# Patient Record
Sex: Female | Born: 1955
Health system: Southern US, Community
[De-identification: ages and names within clinical notes are randomized; demographics above are authoritative.]

## PROBLEM LIST (undated history)

## (undated) DIAGNOSIS — I10 Essential (primary) hypertension: Secondary | ICD-10-CM

## (undated) DIAGNOSIS — J302 Other seasonal allergic rhinitis: Secondary | ICD-10-CM

## (undated) DIAGNOSIS — M5030 Other cervical disc degeneration, unspecified cervical region: Secondary | ICD-10-CM

## (undated) DIAGNOSIS — Z973 Presence of spectacles and contact lenses: Secondary | ICD-10-CM

## (undated) DIAGNOSIS — H409 Unspecified glaucoma: Secondary | ICD-10-CM

## (undated) DIAGNOSIS — M1990 Unspecified osteoarthritis, unspecified site: Secondary | ICD-10-CM

## (undated) DIAGNOSIS — Z8489 Family history of other specified conditions: Secondary | ICD-10-CM

## (undated) DIAGNOSIS — T7840XA Allergy, unspecified, initial encounter: Secondary | ICD-10-CM

## (undated) DIAGNOSIS — E785 Hyperlipidemia, unspecified: Secondary | ICD-10-CM

## (undated) DIAGNOSIS — H269 Unspecified cataract: Secondary | ICD-10-CM

## (undated) HISTORY — DX: Hyperlipidemia, unspecified: E78.5

## (undated) HISTORY — DX: Unspecified osteoarthritis, unspecified site: M19.90

## (undated) HISTORY — DX: Unspecified cataract: H26.9

## (undated) HISTORY — PX: KNEE ARTHROSCOPY: SHX127

## (undated) HISTORY — DX: Allergy, unspecified, initial encounter: T78.40XA

## (undated) HISTORY — PX: POLYPECTOMY: SHX149

## (undated) HISTORY — DX: Essential (primary) hypertension: I10

## (undated) HISTORY — PX: COLONOSCOPY: SHX174

---

## 1998-10-19 HISTORY — PX: CERVICAL FUSION: SHX112

## 1999-07-29 ENCOUNTER — Encounter: Payer: Self-pay | Admitting: Neurosurgery

## 1999-07-29 ENCOUNTER — Inpatient Hospital Stay (HOSPITAL_COMMUNITY): Admission: RE | Admit: 1999-07-29 | Discharge: 1999-07-30 | Payer: Self-pay | Admitting: Neurosurgery

## 1999-09-10 ENCOUNTER — Encounter: Payer: Self-pay | Admitting: Neurosurgery

## 1999-09-10 ENCOUNTER — Encounter: Admission: RE | Admit: 1999-09-10 | Discharge: 1999-09-10 | Payer: Self-pay | Admitting: Neurosurgery

## 2000-08-12 ENCOUNTER — Encounter: Payer: Self-pay | Admitting: Obstetrics and Gynecology

## 2000-08-12 ENCOUNTER — Encounter: Admission: RE | Admit: 2000-08-12 | Discharge: 2000-08-12 | Payer: Self-pay | Admitting: Obstetrics and Gynecology

## 2002-06-01 ENCOUNTER — Encounter: Admission: RE | Admit: 2002-06-01 | Discharge: 2002-06-01 | Payer: Self-pay | Admitting: Obstetrics and Gynecology

## 2002-06-01 ENCOUNTER — Encounter: Payer: Self-pay | Admitting: Obstetrics and Gynecology

## 2004-10-19 DIAGNOSIS — N6009 Solitary cyst of unspecified breast: Secondary | ICD-10-CM

## 2004-10-19 HISTORY — DX: Solitary cyst of unspecified breast: N60.09

## 2005-03-13 ENCOUNTER — Encounter: Admission: RE | Admit: 2005-03-13 | Discharge: 2005-03-13 | Payer: Self-pay | Admitting: Obstetrics and Gynecology

## 2005-03-20 ENCOUNTER — Encounter: Admission: RE | Admit: 2005-03-20 | Discharge: 2005-03-20 | Payer: Self-pay | Admitting: Obstetrics and Gynecology

## 2006-05-25 ENCOUNTER — Encounter: Admission: RE | Admit: 2006-05-25 | Discharge: 2006-05-25 | Payer: Self-pay | Admitting: Obstetrics and Gynecology

## 2007-09-09 ENCOUNTER — Encounter: Admission: RE | Admit: 2007-09-09 | Discharge: 2007-09-09 | Payer: Self-pay | Admitting: Obstetrics and Gynecology

## 2007-09-16 ENCOUNTER — Encounter: Admission: RE | Admit: 2007-09-16 | Discharge: 2007-09-16 | Payer: Self-pay | Admitting: Obstetrics and Gynecology

## 2008-02-29 ENCOUNTER — Encounter: Admission: RE | Admit: 2008-02-29 | Discharge: 2008-02-29 | Payer: Self-pay | Admitting: Obstetrics and Gynecology

## 2008-09-12 ENCOUNTER — Encounter: Admission: RE | Admit: 2008-09-12 | Discharge: 2008-09-12 | Payer: Self-pay | Admitting: Obstetrics and Gynecology

## 2009-01-10 ENCOUNTER — Encounter (HOSPITAL_COMMUNITY): Admission: RE | Admit: 2009-01-10 | Discharge: 2009-01-24 | Payer: Self-pay | Admitting: Orthopedic Surgery

## 2009-05-06 ENCOUNTER — Encounter: Admission: RE | Admit: 2009-05-06 | Discharge: 2009-05-06 | Payer: Self-pay | Admitting: Obstetrics and Gynecology

## 2009-11-22 ENCOUNTER — Encounter: Admission: RE | Admit: 2009-11-22 | Discharge: 2009-11-22 | Payer: Self-pay | Admitting: Obstetrics and Gynecology

## 2010-11-09 ENCOUNTER — Encounter: Payer: Self-pay | Admitting: Obstetrics and Gynecology

## 2011-02-02 ENCOUNTER — Other Ambulatory Visit: Payer: Self-pay | Admitting: Obstetrics and Gynecology

## 2011-02-02 DIAGNOSIS — Z1231 Encounter for screening mammogram for malignant neoplasm of breast: Secondary | ICD-10-CM

## 2011-02-05 ENCOUNTER — Ambulatory Visit
Admission: RE | Admit: 2011-02-05 | Discharge: 2011-02-05 | Disposition: A | Discharge status: Home / Self Care | Payer: 59 | Source: Ambulatory Visit | Attending: Obstetrics and Gynecology | Admitting: Obstetrics and Gynecology

## 2011-02-05 DIAGNOSIS — Z1231 Encounter for screening mammogram for malignant neoplasm of breast: Secondary | ICD-10-CM

## 2011-03-12 ENCOUNTER — Ambulatory Visit (AMBULATORY_SURGERY_CENTER): Payer: 59 | Admitting: *Deleted

## 2011-03-12 VITALS — Ht 68.0 in | Wt 204.0 lb

## 2011-03-12 DIAGNOSIS — Z1211 Encounter for screening for malignant neoplasm of colon: Secondary | ICD-10-CM

## 2011-03-12 MED ORDER — PEG-KCL-NACL-NASULF-NA ASC-C 100 G PO SOLR: ORAL | Status: DC

## 2011-03-26 ENCOUNTER — Encounter: Payer: Self-pay | Admitting: Internal Medicine

## 2011-03-26 ENCOUNTER — Ambulatory Visit (AMBULATORY_SURGERY_CENTER): Payer: 59 | Admitting: Internal Medicine

## 2011-03-26 VITALS — BP 139/93 | HR 67 | Temp 97.8°F | Resp 20 | Ht 68.0 in | Wt 204.0 lb

## 2011-03-26 DIAGNOSIS — K5730 Diverticulosis of large intestine without perforation or abscess without bleeding: Secondary | ICD-10-CM

## 2011-03-26 DIAGNOSIS — Z1211 Encounter for screening for malignant neoplasm of colon: Secondary | ICD-10-CM

## 2011-03-26 DIAGNOSIS — D1330 Benign neoplasm of unspecified part of small intestine: Secondary | ICD-10-CM

## 2011-03-26 DIAGNOSIS — D126 Benign neoplasm of colon, unspecified: Secondary | ICD-10-CM

## 2011-03-26 MED ORDER — SODIUM CHLORIDE 0.9 % IV SOLN: 500.0000 mL | INTRAVENOUS | Status: DC

## 2011-03-26 NOTE — Patient Instructions (Signed)
Discharge instructions given with verbal understanding.  Handouts on polyps and diverticulosis given.  Resume previous medications. 

## 2011-03-27 ENCOUNTER — Telehealth: Payer: Self-pay | Admitting: *Deleted

## 2011-03-27 NOTE — Telephone Encounter (Signed)
No ID on answering machine, no message left 

## 2012-02-12 ENCOUNTER — Other Ambulatory Visit: Payer: Self-pay | Admitting: Obstetrics

## 2012-02-12 DIAGNOSIS — Z1231 Encounter for screening mammogram for malignant neoplasm of breast: Secondary | ICD-10-CM

## 2012-03-08 ENCOUNTER — Ambulatory Visit
Admission: RE | Admit: 2012-03-08 | Discharge: 2012-03-08 | Disposition: A | Discharge status: Home / Self Care | Payer: 59 | Source: Ambulatory Visit | Attending: Obstetrics | Admitting: Obstetrics

## 2012-03-08 DIAGNOSIS — Z1231 Encounter for screening mammogram for malignant neoplasm of breast: Secondary | ICD-10-CM

## 2012-03-11 ENCOUNTER — Other Ambulatory Visit: Payer: Self-pay | Admitting: Obstetrics

## 2012-03-11 DIAGNOSIS — R928 Other abnormal and inconclusive findings on diagnostic imaging of breast: Secondary | ICD-10-CM

## 2012-03-22 ENCOUNTER — Ambulatory Visit
Admission: RE | Admit: 2012-03-22 | Discharge: 2012-03-22 | Disposition: A | Discharge status: Home / Self Care | Payer: 59 | Source: Ambulatory Visit | Attending: Obstetrics | Admitting: Obstetrics

## 2012-03-22 DIAGNOSIS — R928 Other abnormal and inconclusive findings on diagnostic imaging of breast: Secondary | ICD-10-CM

## 2012-07-30 ENCOUNTER — Encounter (HOSPITAL_COMMUNITY): Payer: Self-pay | Admitting: *Deleted

## 2012-07-30 ENCOUNTER — Emergency Department (HOSPITAL_COMMUNITY)
Admission: EM | Admit: 2012-07-30 | Discharge: 2012-07-30 | Disposition: A | Discharge status: Home / Self Care | Payer: 59 | Source: Home / Self Care

## 2012-07-30 DIAGNOSIS — J029 Acute pharyngitis, unspecified: Secondary | ICD-10-CM

## 2012-07-30 MED ORDER — AZITHROMYCIN 250 MG PO TABS: ORAL_TABLET | ORAL | Status: DC

## 2012-07-30 NOTE — ED Notes (Signed)
On Tues started with extreme fatigue, sore throat, and fevers.  Felt better Thurs & Fri, then woke up this morning "starting all over again" with sore throat, HA, gland soreness.  Nausea at times, no vomiting.  Denies cough or any congestion.  Throat red.  Has been taking Aleve (last dose @ 0800) and Nyquil.

## 2012-07-30 NOTE — ED Provider Notes (Signed)
History     CSN: [id number]  Arrival date & time 07/30/12  1102   None     Chief Complaint  Patient presents with  . Sore Throat  . Headache  . Fever    (Consider location/radiation/quality/duration/timing/severity/associated sxs/prior treatment) Patient is a 56 y.o. female presenting with pharyngitis. The history is provided by the patient. No language interpreter was used.  Sore Throat This is a new problem. Episode onset: 4 days. The problem occurs constantly. The problem has been gradually worsening. Nothing relieves the symptoms. She has tried nothing for the symptoms.  Pt complains of cough, fever and sore throat.    Past Medical History  Diagnosis Date  . Allergy     seasonal  . Arthritis     Past Surgical History  Procedure Date  . Knee arthroscopy     bilateral  . Cervical fusion     No family history on file.  History  Substance Use Topics  . Smoking status: Never Smoker   . Smokeless tobacco: Never Used  . Alcohol Use: Yes     occasional    OB History    Grav Para Term Preterm Abortions TAB SAB Ect Mult Living                  Review of Systems  HENT: Positive for sore throat and sinus pressure.   Respiratory: Positive for cough.   All other systems reviewed and are negative.    Allergies  Penicillins and Sulfa antibiotics  Home Medications   Current Outpatient Rx  Name Route Sig Dispense Refill  . ESTROGENS CONJ SYNTHETIC A PO Oral Take by mouth.    . MULTI-VITAMIN/MINERALS PO TABS Oral Take 1 tablet by mouth daily.      Marland Kitchen CALCIUM CARBONATE 1250 MG PO CAPS Oral Take 1,250 mg by mouth 2 (two) times daily with a meal.      . VITAMIN D 1000 UNITS PO TABS Oral Take 1,000 Units by mouth daily.      Marland Kitchen JINTELI 1-5 MG-MCG PO TABS Oral Take 1 tablet by mouth Daily.    Marland Kitchen LORATADINE 10 MG PO TABS Oral Take 10 mg by mouth daily.      Marland Kitchen NAPROXEN SODIUM 220 MG PO TABS Oral Take 440 mg by mouth at bedtime.        BP 165/85  Pulse 66  Temp  98.3 F (36.8 C) (Oral)  Resp 16  SpO2 100%  Physical Exam  Nursing note and vitals reviewed. Constitutional: She appears well-developed and well-nourished.  HENT:  Head: Normocephalic and atraumatic.  Right Ear: External ear normal.  Left Ear: External ear normal.  Nose: Nose normal.  Mouth/Throat: Oropharynx is clear and moist.  Eyes: Conjunctivae normal and EOM are normal. Pupils are equal, round, and reactive to light.  Neck: Normal range of motion. Neck supple.  Cardiovascular: Normal rate and normal heart sounds.   Pulmonary/Chest: Effort normal and breath sounds normal.  Abdominal: Soft.  Musculoskeletal: Normal range of motion.  Neurological: She is alert.  Skin: Skin is warm.    ED Course  Procedures (including critical care time)   Labs Reviewed  POCT RAPID STREP A ([facility] CARE ONLY)   No results found.   1. Pharyngitis       MDM  Rx for zithromax.   I advisd recheck with primary in 2-3 days.       Lonia Skinner [place], [place] 07/30/12 [phone]

## 2012-08-01 NOTE — ED Provider Notes (Signed)
Medical screening examination/treatment/procedure(s) were performed by non-physician practitioner and as supervising physician I was immediately available for consultation/collaboration.  Leslee Home, M.D.   Reuben Likes, MD 08/01/12 [phone]

## 2012-10-19 HISTORY — PX: CATARACT EXTRACTION W/ INTRAOCULAR LENS IMPLANT: SHX1309

## 2012-10-19 HISTORY — PX: CARPAL TUNNEL RELEASE: SHX101

## 2013-03-15 ENCOUNTER — Other Ambulatory Visit: Payer: Self-pay

## 2013-03-15 DIAGNOSIS — Z1231 Encounter for screening mammogram for malignant neoplasm of breast: Secondary | ICD-10-CM

## 2013-04-19 ENCOUNTER — Ambulatory Visit: Payer: 59

## 2013-05-01 ENCOUNTER — Ambulatory Visit
Admission: RE | Admit: 2013-05-01 | Discharge: 2013-05-01 | Disposition: A | Discharge status: Home / Self Care | Payer: 59 | Source: Ambulatory Visit

## 2013-05-01 ENCOUNTER — Other Ambulatory Visit: Payer: Self-pay | Admitting: Physician Assistant

## 2013-05-01 ENCOUNTER — Other Ambulatory Visit: Payer: Self-pay

## 2013-05-01 ENCOUNTER — Encounter: Payer: Self-pay | Admitting: Family Medicine

## 2013-05-01 DIAGNOSIS — Z1231 Encounter for screening mammogram for malignant neoplasm of breast: Secondary | ICD-10-CM

## 2013-05-01 NOTE — Telephone Encounter (Signed)
Medication refill for one time only.  Patient needs to be seen.  Letter sent for patient to call and schedule 

## 2013-05-30 ENCOUNTER — Ambulatory Visit (INDEPENDENT_AMBULATORY_CARE_PROVIDER_SITE_OTHER): Payer: 59 | Admitting: Family Medicine

## 2013-05-30 ENCOUNTER — Encounter: Payer: Self-pay | Admitting: Family Medicine

## 2013-05-30 VITALS — BP 110/78 | HR 74 | Temp 98.4°F | Resp 16 | Ht 68.0 in | Wt 211.0 lb

## 2013-05-30 DIAGNOSIS — I10 Essential (primary) hypertension: Secondary | ICD-10-CM | POA: Insufficient documentation

## 2013-05-30 LAB — CBC WITH DIFFERENTIAL/PLATELET
Eosinophils Relative: 5 % (ref 0–5)
HCT: 40.1 % (ref 36.0–46.0)
Hemoglobin: 13.8 g/dL (ref 12.0–15.0)
Lymphocytes Relative: 38 % (ref 12–46)
Lymphs Abs: 2 10*3/uL (ref 0.7–4.0)
MCV: 89.1 fL (ref 78.0–100.0)
Monocytes Absolute: 0.4 10*3/uL (ref 0.1–1.0)
Monocytes Relative: 7 % (ref 3–12)
RBC: 4.5 MIL/uL (ref 3.87–5.11)
RDW: 13.3 % (ref 11.5–15.5)
WBC: 5.2 10*3/uL (ref 4.0–10.5)

## 2013-05-30 LAB — COMPLETE METABOLIC PANEL WITH GFR
CO2: 29 mEq/L (ref 19–32)
GFR, Est African American: 71 mL/min
GFR, Est Non African American: 61 mL/min
Glucose, Bld: 103 mg/dL — ABNORMAL HIGH (ref 70–99)
Sodium: 141 mEq/L (ref 135–145)
Total Bilirubin: 0.9 mg/dL (ref 0.3–1.2)
Total Protein: 7.2 g/dL (ref 6.0–8.3)

## 2013-05-30 LAB — LIPID PANEL: HDL: 72 mg/dL (ref >39)

## 2013-05-30 NOTE — Progress Notes (Signed)
  Subjective:    Patient ID: Nancy Steele, female    DOB: [birth year], 57 y.o.   MRN: [id number]  HPI Patient is here today for followup of her blood pressure. She is currently on lisinopril 5 mg by mouth daily. She denies any chest pain, shortness of breath, dyspnea on exertion. Her blood pressure is low at 110-120/70-80. She also reports a daily cough on the lisinopril. She is requesting having her cholesterol checked for screening purposes. Colonoscopy is up to date. Her tetanus shot is up-to-date. She currently sees a gynecologist for her Pap smear, breast exam and hormone replacement. Past Medical History  Diagnosis Date  . Allergy     seasonal  . Arthritis   . Hypertension    Current outpatient prescriptions:aspirin 81 MG tablet, Take 81 mg by mouth daily., Disp: , Rfl: ;  Calcium Carb-Cholecalciferol (CALTRATE 600+D) 600-800 MG-UNIT TABS, Take by mouth., Disp: , Rfl: ;  norethindrone-ethinyl estradiol (FEMHRT LOW DOSE) 0.5-2.5 MG-MCG per tablet, Take 1 tablet by mouth daily., Disp: , Rfl:  Current facility-administered medications:0.9 %  sodium chloride infusion, 500 mL, Intravenous, Continuous, Hilarie Fredrickson, MD  Allergies  Allergen Reactions  . Vancomycin Hives  . Penicillins Swelling and Rash  . Sulfa Antibiotics Nausea And Vomiting   History   Social History  . Marital Status: Married    Spouse Name: N/A    Number of Children: N/A  . Years of Education: N/A   Occupational History  . Not on file.   Social History Main Topics  . Smoking status: Never Smoker   . Smokeless tobacco: Never Used  . Alcohol Use: Yes     Comment: occasional  . Drug Use: No  . Sexually Active: Not on file   Other Topics Concern  . Not on file   Social History Narrative  . No narrative on file   No family history on file.    Review of Systems  All other systems reviewed and are negative.       Objective:   Physical Exam  Vitals reviewed. Constitutional: She appears  well-developed and well-nourished.  Neck: Neck supple. No JVD present. No thyromegaly present.  Cardiovascular: Normal rate, regular rhythm and intact distal pulses.  Exam reveals no gallop and no friction rub.   No murmur heard. Pulmonary/Chest: Effort normal and breath sounds normal. No respiratory distress. She has no wheezes. She has no rales. She exhibits no tenderness.  Abdominal: Soft. Bowel sounds are normal. She exhibits no distension and no mass. There is no tenderness. There is no rebound and no guarding.  Musculoskeletal: She exhibits no edema.  Lymphadenopathy:    She has no cervical adenopathy.          Assessment & Plan:  1. HTN (hypertension) Discontinue lisinopril. Monitor blood pressure closely at home. If greater than 140/90 I would resume medication. Check fasting lipid panel today. Goal LDL is less than 130.  Cancer screening is up to date. Recommended prominent milligrams a day of calcium and 1000 units a day of vitamin D. - COMPLETE METABOLIC PANEL WITH GFR - Lipid panel - CBC with Differential

## 2013-06-01 ENCOUNTER — Encounter: Payer: Self-pay | Admitting: Family Medicine

## 2013-06-25 ENCOUNTER — Other Ambulatory Visit: Payer: Self-pay | Admitting: Physician Assistant

## 2013-06-26 IMAGING — MG MM DIGITAL DIAGNOSTIC LIMITED*R*
2 series · 2 of 2 positions shown · non-contrast
Comparison: 03/08/2012 and earlier

CLINICAL DATA: The patient returns after screening study for
evaluation of the right breast.

DIGITAL DIAGNOSTIC RIGHT MAMMOGRAM  AND RIGHT BREAST ULTRASOUND:

[R CC]
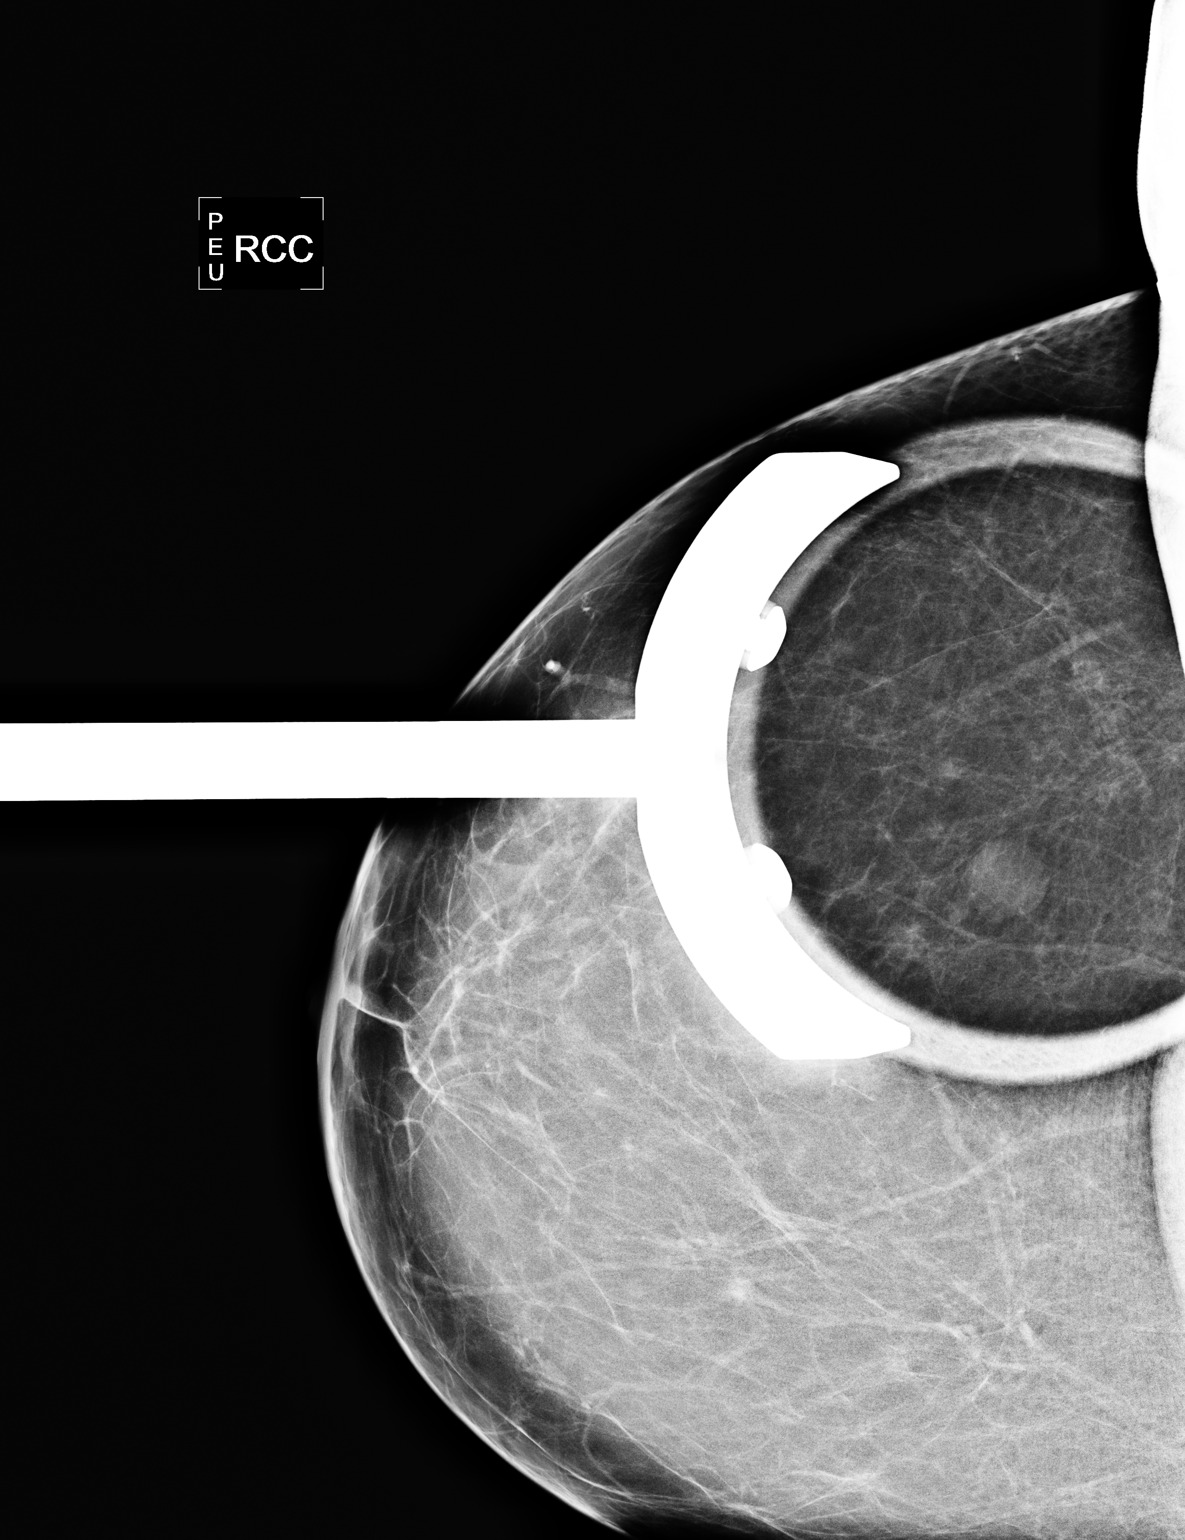

[R MLO]
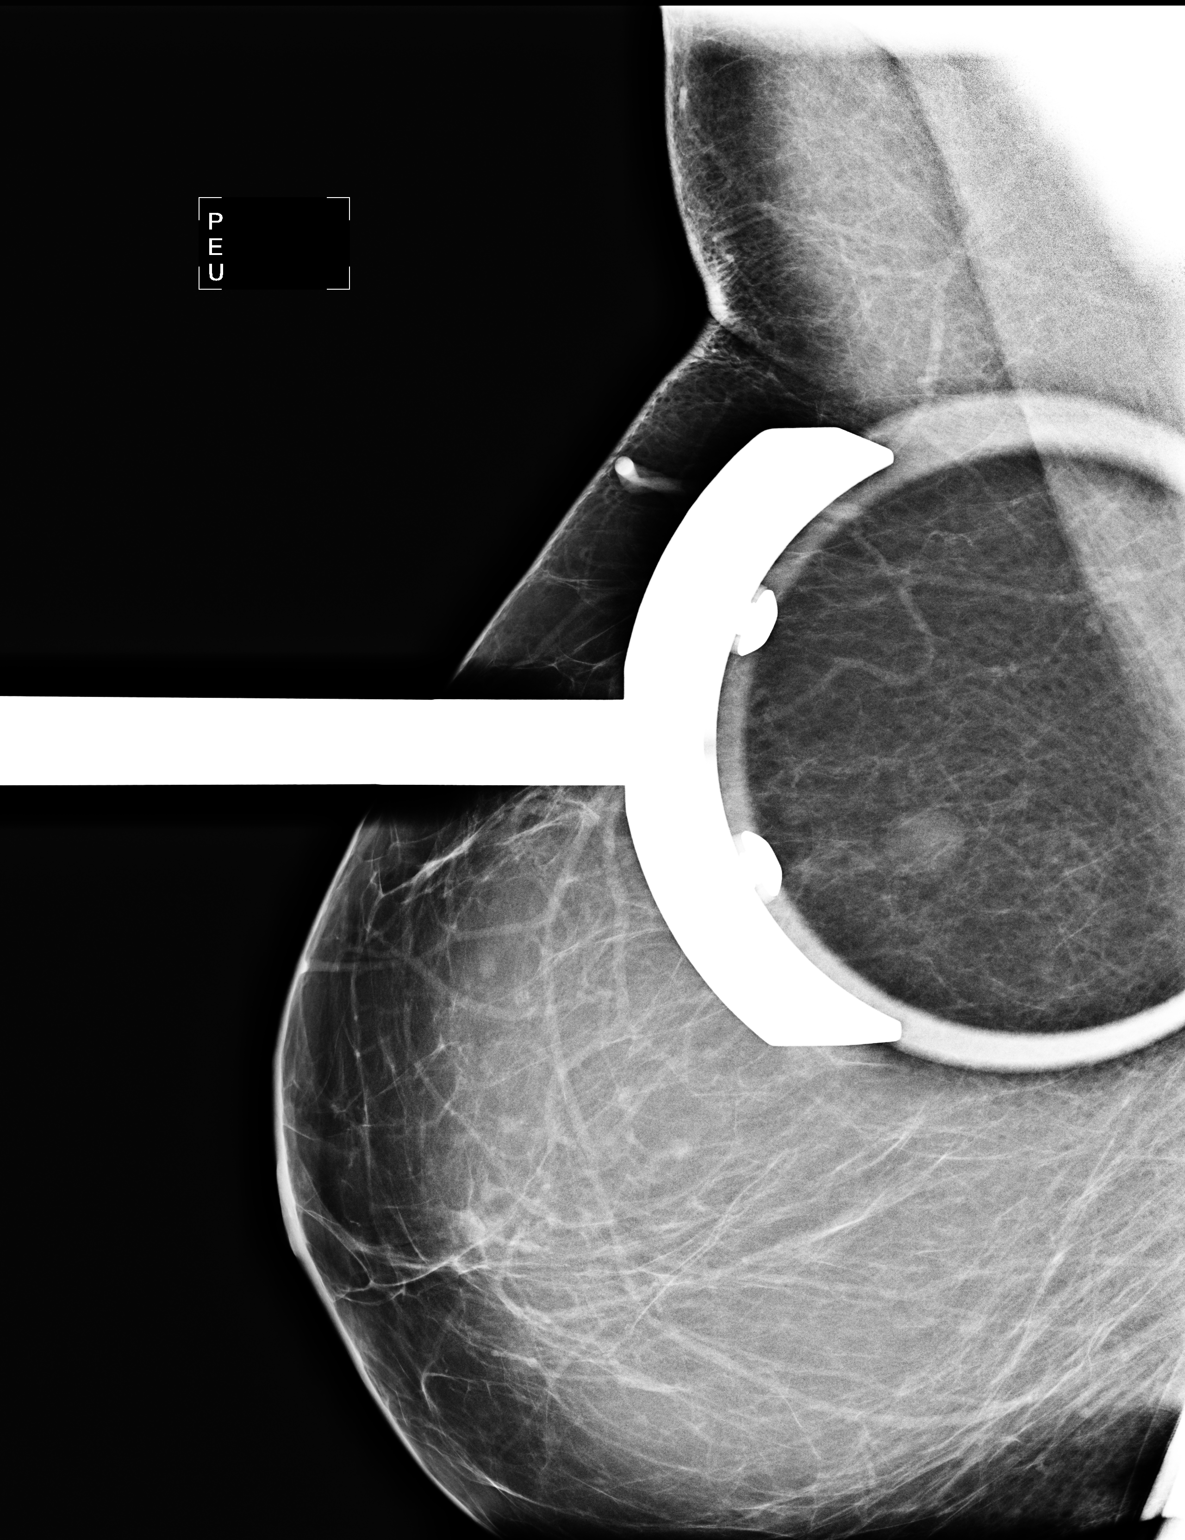

[2 of 2 positions shown; findings below may reference images not displayed]

FINDINGS: Spot compression views are performed, confirming
presence of a low density circumscribed round mass in the upper
outer quadrant of the right breast.

On physical exam, I palpate no abnormality within the upper-outer
quadrant of the right breast.

Ultrasound is performed, showing a simple cyst in the 10 o'clock
position of the right breast 5 cm from the nipple.  This measures 9
x 5 x 11 mm.  No solid mass or area of acoustic shadowing
identified.
IMPRESSION: 1.  Persistent abnormality represents a simple cyst by ultrasound.
2. No mammographic or ultrasound evidence for malignancy.
3. Screening mammogram is recommended in one year.

BI-RADS CATEGORY 2:  Benign finding(s).

## 2013-06-26 IMAGING — US US BREAST*R*
1 series · 4 of 4 positions shown · non-contrast
Comparison: 03/08/2012 and earlier

CLINICAL DATA: The patient returns after screening study for
evaluation of the right breast.

DIGITAL DIAGNOSTIC RIGHT MAMMOGRAM  AND RIGHT BREAST ULTRASOUND:

[Series 1: us breast*right* · 4 of 4 slices shown]
[im 1/4]
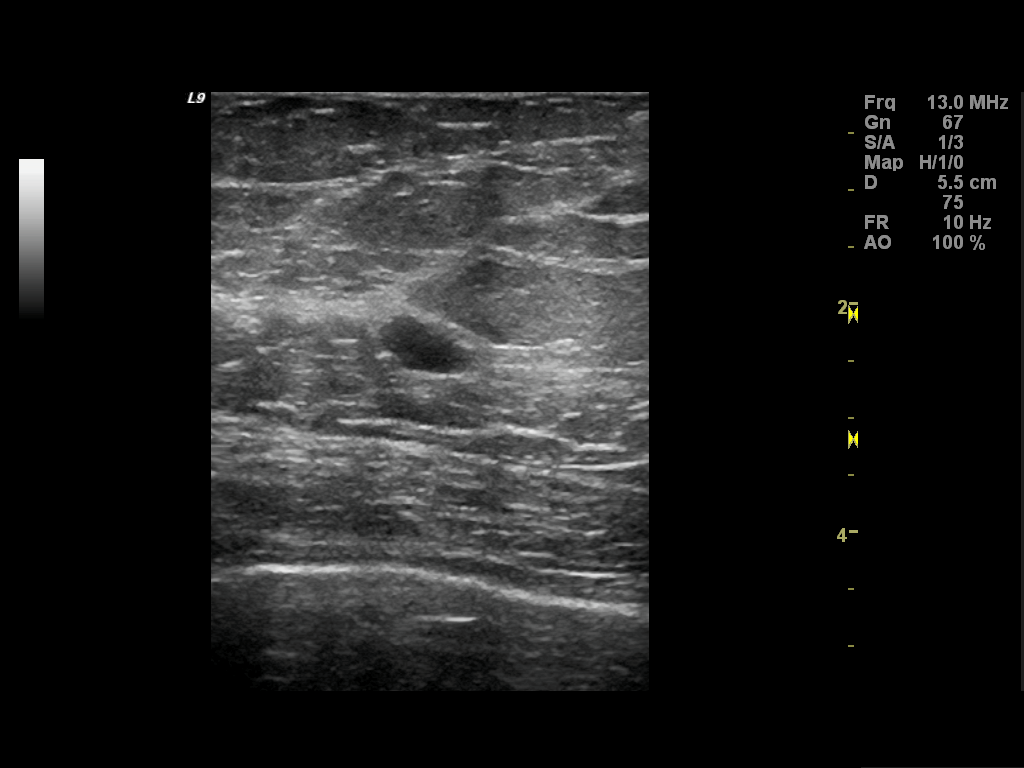
[im 2/4]
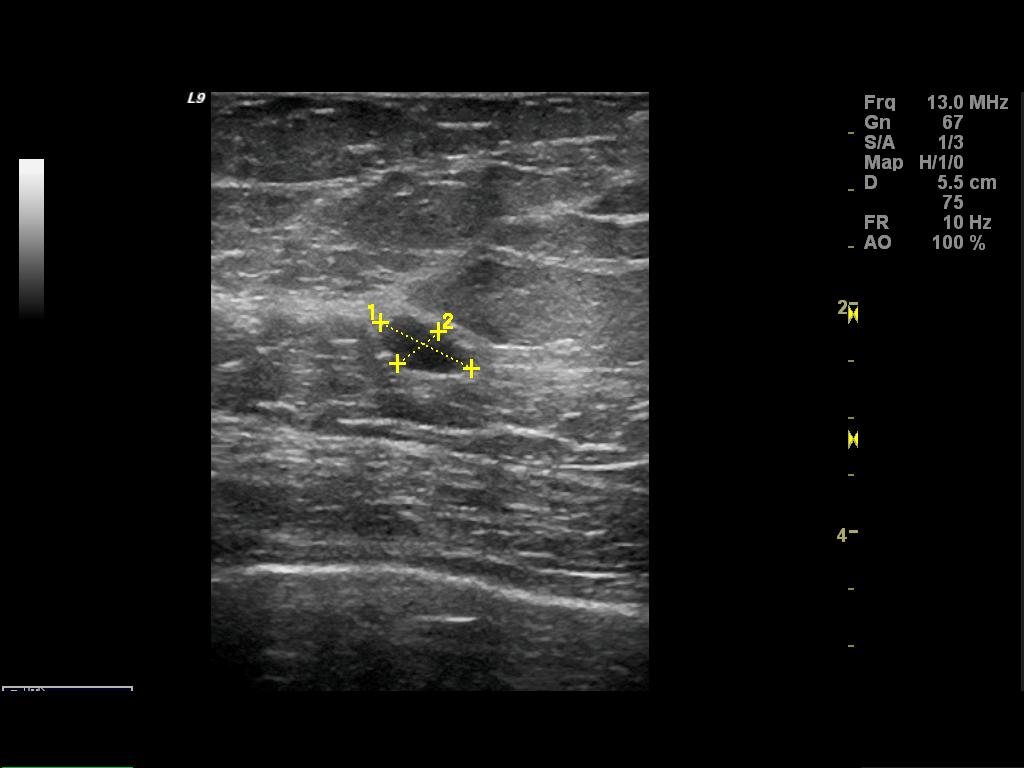
[im 3/4]
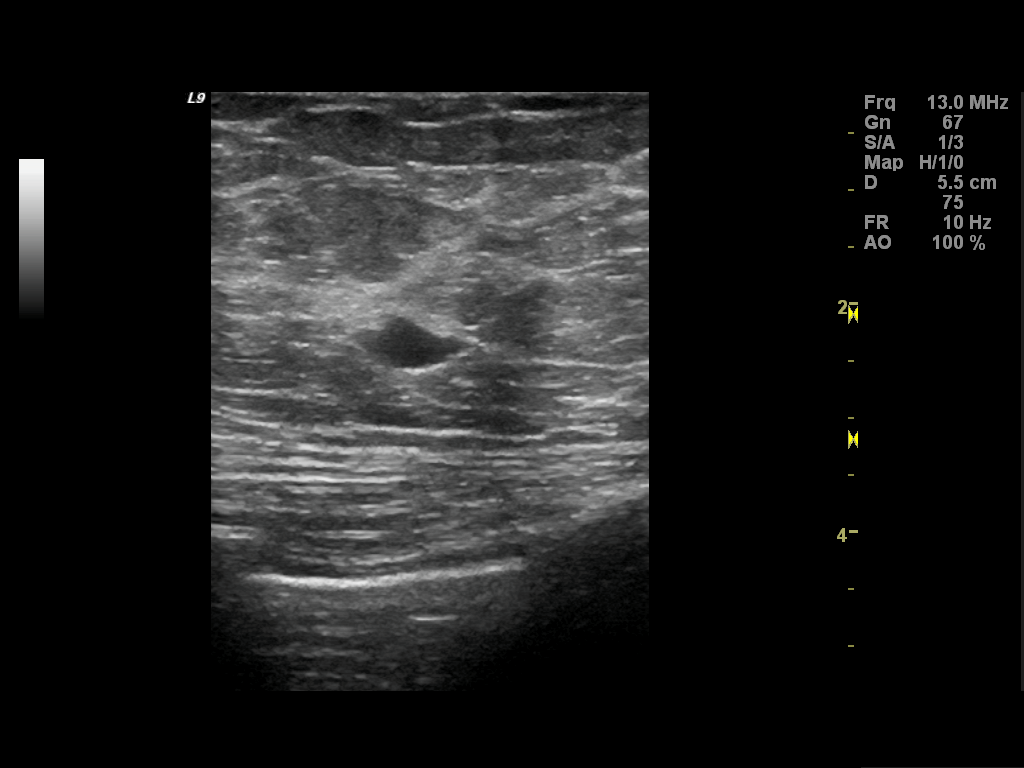
[im 4/4]
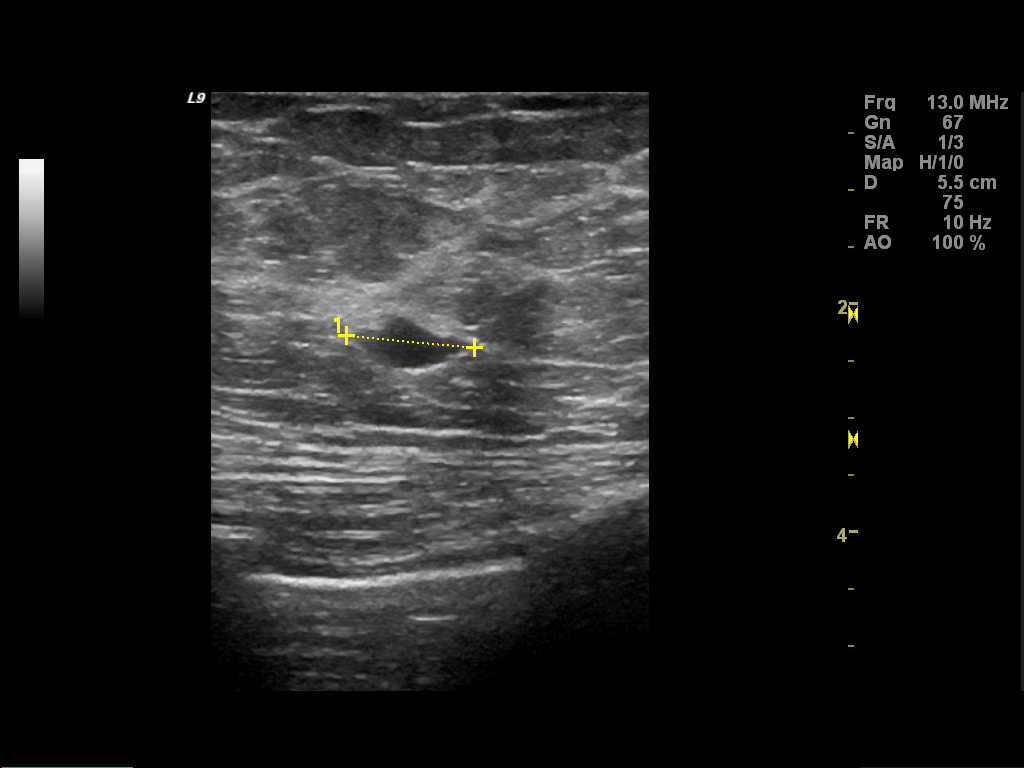

[4 of 4 positions shown; findings below may reference images not displayed]

FINDINGS: Spot compression views are performed, confirming
presence of a low density circumscribed round mass in the upper
outer quadrant of the right breast.

On physical exam, I palpate no abnormality within the upper-outer
quadrant of the right breast.

Ultrasound is performed, showing a simple cyst in the 10 o'clock
position of the right breast 5 cm from the nipple.  This measures 9
x 5 x 11 mm.  No solid mass or area of acoustic shadowing
identified.
IMPRESSION: 1.  Persistent abnormality represents a simple cyst by ultrasound.
2. No mammographic or ultrasound evidence for malignancy.
3. Screening mammogram is recommended in one year.

BI-RADS CATEGORY 2:  Benign finding(s).

## 2013-08-24 ENCOUNTER — Other Ambulatory Visit: Payer: Self-pay

## 2014-03-01 ENCOUNTER — Other Ambulatory Visit: Payer: Self-pay | Admitting: Neurological Surgery

## 2014-03-01 DIAGNOSIS — M47812 Spondylosis without myelopathy or radiculopathy, cervical region: Secondary | ICD-10-CM

## 2014-03-04 ENCOUNTER — Ambulatory Visit
Admission: RE | Admit: 2014-03-04 | Discharge: 2014-03-04 | Disposition: A | Discharge status: Home / Self Care | Payer: Self-pay | Source: Ambulatory Visit | Attending: Neurological Surgery | Admitting: Neurological Surgery

## 2014-03-04 DIAGNOSIS — M47812 Spondylosis without myelopathy or radiculopathy, cervical region: Secondary | ICD-10-CM

## 2014-06-14 ENCOUNTER — Other Ambulatory Visit: Payer: Self-pay

## 2014-06-14 DIAGNOSIS — Z1231 Encounter for screening mammogram for malignant neoplasm of breast: Secondary | ICD-10-CM

## 2014-06-22 ENCOUNTER — Ambulatory Visit (INDEPENDENT_AMBULATORY_CARE_PROVIDER_SITE_OTHER): Payer: 59 | Admitting: Family Medicine

## 2014-06-22 ENCOUNTER — Encounter: Payer: Self-pay | Admitting: Family Medicine

## 2014-06-22 ENCOUNTER — Ambulatory Visit
Admission: RE | Admit: 2014-06-22 | Discharge: 2014-06-22 | Disposition: A | Discharge status: Home / Self Care | Payer: 59 | Source: Ambulatory Visit

## 2014-06-22 VITALS — BP 146/100 | HR 80 | Temp 98.3°F | Resp 16 | Ht 68.0 in | Wt 208.0 lb

## 2014-06-22 DIAGNOSIS — Z1231 Encounter for screening mammogram for malignant neoplasm of breast: Secondary | ICD-10-CM

## 2014-06-22 DIAGNOSIS — I10 Essential (primary) hypertension: Secondary | ICD-10-CM

## 2014-06-22 LAB — BASIC METABOLIC PANEL WITHOUT GFR
BUN: 19 mg/dL (ref 6–23)
CO2: 29 meq/L (ref 19–32)
Calcium: 9.7 mg/dL (ref 8.4–10.5)
Chloride: 105 meq/L (ref 96–112)
Creat: 0.91 mg/dL (ref 0.50–1.10)
GFR, Est African American: 82 mL/min
GFR, Est Non African American: 71 mL/min
Glucose, Bld: 87 mg/dL (ref 70–99)
Potassium: 4.6 meq/L (ref 3.5–5.3)
Sodium: 142 meq/L (ref 135–145)

## 2014-06-22 MED ORDER — LISINOPRIL 20 MG PO TABS: 20.0000 mg | ORAL_TABLET | Freq: Every day | ORAL | Status: DC

## 2014-06-22 NOTE — Progress Notes (Signed)
   Subjective:    Patient ID: Nancy Steele, female    DOB: 06-07-1956, 58 y.o.   MRN: [id number]  HPI  Patient's blood pressure has recently been elevated in the 140's/100's.  She denies any chest pain shortness of breath or dyspnea on exertion. She has been dealing with increasing pain in her neck her knee and her feet. She is also been taking NSAIDs regularly for pain which may be contributing to her elevated blood pressure. She denies any increased salt consumption. Past Medical History  Diagnosis Date  . Allergy     seasonal  . Arthritis   . Hypertension    Current Outpatient Prescriptions on File Prior to Visit  Medication Sig Dispense Refill  . aspirin 81 MG tablet Take 81 mg by mouth daily.      . Calcium Carb-Cholecalciferol (CALTRATE 600+D) 600-800 MG-UNIT TABS Take by mouth.       Current Facility-Administered Medications on File Prior to Visit  Medication Dose Route Frequency Provider Last Rate Last Dose  . 0.9 %  sodium chloride infusion  500 mL Intravenous Continuous Irene Shipper, MD       Allergies  Allergen Reactions  . Vancomycin Hives  . Penicillins Swelling and Rash  . Sulfa Antibiotics Nausea And Vomiting   History   Social History  . Marital Status: Married    Spouse Name: N/A    Number of Children: N/A  . Years of Education: N/A   Occupational History  . Not on file.   Social History Main Topics  . Smoking status: Never Smoker   . Smokeless tobacco: Never Used  . Alcohol Use: Yes     Comment: occasional  . Drug Use: No  . Sexual Activity: Not on file   Other Topics Concern  . Not on file   Social History Narrative  . No narrative on file     Review of Systems  All other systems reviewed and are negative.      Objective:   Physical Exam  Vitals reviewed. Cardiovascular: Normal rate, regular rhythm, normal heart sounds and intact distal pulses.   No murmur heard. Pulmonary/Chest: Effort normal and breath sounds normal. No  respiratory distress. She has no wheezes. She has no rales.  Abdominal: Soft. Bowel sounds are normal. She exhibits no distension. There is no tenderness. There is no rebound and no guarding.  Musculoskeletal: She exhibits edema.          Assessment & Plan:  Essential hypertension - Plan: lisinopril (PRINIVIL,ZESTRIL) 20 MG tablet, BASIC METABOLIC PANEL WITH GFR  Begin lisinopril 20 mg by mouth daily and recheck blood pressure in 3 weeks check BMP to evaluate renal function.

## 2014-10-10 ENCOUNTER — Encounter (HOSPITAL_BASED_OUTPATIENT_CLINIC_OR_DEPARTMENT_OTHER): Payer: Self-pay | Admitting: *Deleted

## 2014-10-10 NOTE — Progress Notes (Signed)
To go to [facility] for [id number]

## 2014-10-15 ENCOUNTER — Other Ambulatory Visit: Payer: Self-pay

## 2014-10-15 ENCOUNTER — Encounter (HOSPITAL_COMMUNITY)
Admission: RE | Admit: 2014-10-15 | Discharge: 2014-10-15 | Disposition: A | Discharge status: Home / Self Care | Payer: 59 | Source: Ambulatory Visit | Attending: Orthopedic Surgery | Admitting: Orthopedic Surgery

## 2014-10-15 ENCOUNTER — Encounter (HOSPITAL_BASED_OUTPATIENT_CLINIC_OR_DEPARTMENT_OTHER): Payer: Self-pay | Admitting: Physician Assistant

## 2014-10-15 DIAGNOSIS — Z881 Allergy status to other antibiotic agents status: Secondary | ICD-10-CM | POA: Diagnosis not present

## 2014-10-15 DIAGNOSIS — S83282A Other tear of lateral meniscus, current injury, left knee, initial encounter: Secondary | ICD-10-CM | POA: Diagnosis not present

## 2014-10-15 DIAGNOSIS — S83242A Other tear of medial meniscus, current injury, left knee, initial encounter: Secondary | ICD-10-CM | POA: Diagnosis present

## 2014-10-15 DIAGNOSIS — M1712 Unilateral primary osteoarthritis, left knee: Secondary | ICD-10-CM | POA: Diagnosis not present

## 2014-10-15 DIAGNOSIS — I10 Essential (primary) hypertension: Secondary | ICD-10-CM | POA: Diagnosis not present

## 2014-10-15 DIAGNOSIS — M94262 Chondromalacia, left knee: Secondary | ICD-10-CM | POA: Diagnosis not present

## 2014-10-15 DIAGNOSIS — Z880 Allergy status to penicillin: Secondary | ICD-10-CM | POA: Diagnosis not present

## 2014-10-15 LAB — BASIC METABOLIC PANEL
ANION GAP: 5 (ref 5–15)
BUN: 17 mg/dL (ref 6–23)
CO2: 30 mmol/L (ref 19–32)
CREATININE: 0.8 mg/dL (ref 0.50–1.10)
Calcium: 9.7 mg/dL (ref 8.4–10.5)
Chloride: 107 mEq/L (ref 96–112)
GFR, EST NON AFRICAN AMERICAN: 81 mL/min — AB (ref >90)
Glucose, Bld: 82 mg/dL (ref 70–99)
POTASSIUM: 4.8 mmol/L (ref 3.5–5.1)
Sodium: 142 mmol/L (ref 135–145)

## 2014-10-15 NOTE — H&P (Signed)
Nancy Steele is an 58 y.o. female.   Chief Complaint: left knee pain HPI: Nancy Steele is a 58 year old seen for evaluation for 3 months of significant left knee pain. No specific injury. She has pain with weightbearing and activity relieved by rest. We gave her Mobic but she couldn't tolerate this. Pain is radiating from her knee to the posterior aspect of her knee.  Past Medical History  Diagnosis Date  . Allergy     seasonal  . Arthritis   . Hypertension   . DDD (degenerative disc disease), cervical   . Acute medial meniscus tear of left knee     Past Surgical History  Procedure Laterality Date  . Knee arthroscopy      bilateral  . Cervical fusion  2000  . Colonoscopy    . Upper gi endoscopy      Family History  Problem Relation Age of Onset  . Diabetes    . Hypertension    . Arthritis     Social History:  reports that she has never smoked. She has never used smokeless tobacco. She reports that she drinks alcohol. She reports that she does not use illicit drugs.  Allergies:  Allergies  Allergen Reactions  . Vancomycin Hives  . Penicillins Swelling and Rash  . Sulfa Antibiotics Nausea And Vomiting    No prescriptions prior to admission    Results for orders placed or performed during the hospital encounter of 10/15/14 (from the past 48 hour(s))  Basic metabolic panel     Status: Abnormal   Collection Time: 10/15/14  1:00 PM  Result Value Ref Range   Sodium 142 135 - 145 mmol/L    Comment: Please note change in reference range.   Potassium 4.8 3.5 - 5.1 mmol/L    Comment: Please note change in reference range.   Chloride 107 96 - 112 mEq/L   CO2 30 19 - 32 mmol/L   Glucose, Bld 82 70 - 99 mg/dL   BUN 17 6 - 23 mg/dL   Creatinine, Ser 0.80 0.50 - 1.10 mg/dL   Calcium 9.7 8.4 - 10.5 mg/dL   GFR calc non Af Amer 81 (L) >90 mL/min   GFR calc Af Amer >90 >90 mL/min    Comment: (NOTE) The eGFR has been calculated using the CKD EPI equation. This calculation  has not been validated in all clinical situations. eGFR's persistently <90 mL/min signify possible Chronic Kidney Disease.    Anion gap 5 5 - 15   No results found.  Review of Systems  Constitutional: Negative.   HENT: Negative.   Eyes: Negative.   Cardiovascular: Negative.   Gastrointestinal: Negative.   Genitourinary: Negative.   Musculoskeletal:       Left knee pain  Skin: Negative.   Neurological: Negative.   Endo/Heme/Allergies: Negative.   Psychiatric/Behavioral: Negative.     Height $Remov'5\' 8"'nanKQp$  (1.727 m), weight 94.348 kg (208 lb). Physical Exam  Constitutional: She is oriented to person, place, and time. She appears well-developed and well-nourished.  HENT:  Head: Normocephalic and atraumatic.  Mouth/Throat: Oropharynx is clear and moist.  Eyes: Conjunctivae and EOM are normal. Pupils are equal, round, and reactive to light.  Neck: Neck supple.  Cardiovascular: Normal rate.   Respiratory: Effort normal.  GI: Soft.  Genitourinary:  Not pertinent to current symptomatology therefore not examined.  Musculoskeletal:  Examination of her left knee reveals pain on the medial joint line positive medial McMurray's 1+ effusion full range of  motion knee is stable with normal patella tracking. Exam of the right knee reveals full range of motion without pain swelling weakness or instability. Vascular exam: pulses 2+ and symmetric.  Neurological: She is alert and oriented to person, place, and time.  Skin: Skin is warm and dry.  Psychiatric: She has a normal mood and affect. Her behavior is normal.     Assessment Principal Problem:   Acute medial meniscus tear of left knee Active Problems:   Hypertension  Plan I spoke to Nancy Steele's husband today concerning her left knee MRI that revealed a medial meniscus tear, which is acute and non-traumatic with chondromalacia.  I have told him that with this finding and her persistent pain recommend that we proceed with left knee  arthroscopy with attention to her meniscal and chondral pathology.  She will be out of work from [employer] for approximately six weeks since there is no light duty available.    Linda Hedges 10/15/2014, 6:28 PM

## 2014-10-17 ENCOUNTER — Ambulatory Visit (HOSPITAL_BASED_OUTPATIENT_CLINIC_OR_DEPARTMENT_OTHER): Payer: 59 | Admitting: Anesthesiology

## 2014-10-17 ENCOUNTER — Ambulatory Visit (HOSPITAL_BASED_OUTPATIENT_CLINIC_OR_DEPARTMENT_OTHER)
Admission: RE | Admit: 2014-10-17 | Discharge: 2014-10-17 | Disposition: A | Discharge status: Home / Self Care | Payer: 59 | Source: Ambulatory Visit | Attending: Orthopedic Surgery | Admitting: Orthopedic Surgery

## 2014-10-17 ENCOUNTER — Encounter (HOSPITAL_BASED_OUTPATIENT_CLINIC_OR_DEPARTMENT_OTHER): Payer: Self-pay | Admitting: Anesthesiology

## 2014-10-17 ENCOUNTER — Encounter (HOSPITAL_BASED_OUTPATIENT_CLINIC_OR_DEPARTMENT_OTHER)
Admission: RE | Disposition: A | Discharge status: Home / Self Care | Payer: Self-pay | Source: Ambulatory Visit | Attending: Orthopedic Surgery

## 2014-10-17 DIAGNOSIS — I10 Essential (primary) hypertension: Secondary | ICD-10-CM | POA: Insufficient documentation

## 2014-10-17 DIAGNOSIS — Z881 Allergy status to other antibiotic agents status: Secondary | ICD-10-CM | POA: Insufficient documentation

## 2014-10-17 DIAGNOSIS — S83282A Other tear of lateral meniscus, current injury, left knee, initial encounter: Secondary | ICD-10-CM | POA: Insufficient documentation

## 2014-10-17 DIAGNOSIS — M94262 Chondromalacia, left knee: Secondary | ICD-10-CM | POA: Insufficient documentation

## 2014-10-17 DIAGNOSIS — M1712 Unilateral primary osteoarthritis, left knee: Secondary | ICD-10-CM | POA: Insufficient documentation

## 2014-10-17 DIAGNOSIS — Z880 Allergy status to penicillin: Secondary | ICD-10-CM | POA: Insufficient documentation

## 2014-10-17 DIAGNOSIS — S83242A Other tear of medial meniscus, current injury, left knee, initial encounter: Secondary | ICD-10-CM | POA: Insufficient documentation

## 2014-10-17 HISTORY — PX: KNEE ARTHROSCOPY WITH MEDIAL MENISECTOMY: SHX5651

## 2014-10-17 HISTORY — PX: CHONDROPLASTY: SHX5177

## 2014-10-17 HISTORY — PX: KNEE ARTHROSCOPY WITH LATERAL MENISECTOMY: SHX6193

## 2014-10-17 HISTORY — DX: Other cervical disc degeneration, unspecified cervical region: M50.30

## 2014-10-17 LAB — POCT HEMOGLOBIN-HEMACUE: HEMOGLOBIN: 13.5 g/dL (ref 12.0–15.0)

## 2014-10-17 SURGERY — ARTHROSCOPY, KNEE, WITH MEDIAL MENISCECTOMY
Anesthesia: General | Site: Knee | Laterality: Left

## 2014-10-17 MED ORDER — CLINDAMYCIN PHOSPHATE 900 MG/50ML IV SOLN
INTRAVENOUS | Status: AC
  Filled 2014-10-17: qty 50

## 2014-10-17 MED ORDER — FENTANYL CITRATE 0.05 MG/ML IJ SOLN: 50.0000 ug | INTRAMUSCULAR | Status: DC | PRN

## 2014-10-17 MED ORDER — EPHEDRINE SULFATE 50 MG/ML IJ SOLN
INTRAMUSCULAR | Status: DC | PRN
  Administered 2014-10-17 (×2): 10 mg via INTRAVENOUS

## 2014-10-17 MED ORDER — FENTANYL CITRATE 0.05 MG/ML IJ SOLN
INTRAMUSCULAR | Status: AC
  Filled 2014-10-17: qty 4

## 2014-10-17 MED ORDER — FENTANYL CITRATE 0.05 MG/ML IJ SOLN
INTRAMUSCULAR | Status: DC | PRN
  Administered 2014-10-17: 50 ug via INTRAVENOUS

## 2014-10-17 MED ORDER — PROMETHAZINE HCL 25 MG/ML IJ SOLN: 6.2500 mg | INTRAMUSCULAR | Status: DC | PRN

## 2014-10-17 MED ORDER — PHENYLEPHRINE HCL 10 MG/ML IJ SOLN
INTRAMUSCULAR | Status: DC | PRN
  Administered 2014-10-17 (×2): 40 ug via INTRAVENOUS

## 2014-10-17 MED ORDER — BUPIVACAINE-EPINEPHRINE (PF) 0.25% -1:200000 IJ SOLN
INTRAMUSCULAR | Status: AC
  Filled 2014-10-17: qty 30

## 2014-10-17 MED ORDER — CLINDAMYCIN PHOSPHATE 900 MG/50ML IV SOLN
900.0000 mg | INTRAVENOUS | Status: AC
  Administered 2014-10-17: 900 mg via INTRAVENOUS

## 2014-10-17 MED ORDER — CHLORHEXIDINE GLUCONATE 4 % EX LIQD: 60.0000 mL | Freq: Once | CUTANEOUS | Status: DC

## 2014-10-17 MED ORDER — EPINEPHRINE HCL 1 MG/ML IJ SOLN
INTRAMUSCULAR | Status: AC
  Filled 2014-10-17: qty 1

## 2014-10-17 MED ORDER — BUPIVACAINE HCL (PF) 0.5 % IJ SOLN
INTRAMUSCULAR | Status: DC | PRN
  Administered 2014-10-17: 15 mL via INTRA_ARTICULAR

## 2014-10-17 MED ORDER — LACTATED RINGERS IV SOLN
INTRAVENOUS | Status: DC
  Administered 2014-10-17 (×2): via INTRAVENOUS

## 2014-10-17 MED ORDER — MIDAZOLAM HCL 2 MG/2ML IJ SOLN
INTRAMUSCULAR | Status: AC
  Filled 2014-10-17: qty 2

## 2014-10-17 MED ORDER — FENTANYL CITRATE 0.05 MG/ML IJ SOLN
50.0000 ug | INTRAMUSCULAR | Status: DC | PRN
  Administered 2014-10-17: 50 ug via INTRAVENOUS

## 2014-10-17 MED ORDER — MIDAZOLAM HCL 2 MG/2ML IJ SOLN: 1.0000 mg | INTRAMUSCULAR | Status: DC | PRN

## 2014-10-17 MED ORDER — HYDROMORPHONE HCL 1 MG/ML IJ SOLN
0.2500 mg | INTRAMUSCULAR | Status: DC | PRN
  Administered 2014-10-17 (×4): 0.5 mg via INTRAVENOUS
  Filled 2014-10-17: qty 1

## 2014-10-17 MED ORDER — LIDOCAINE HCL (CARDIAC) 20 MG/ML IV SOLN
INTRAVENOUS | Status: DC | PRN
  Administered 2014-10-17: 100 mg via INTRAVENOUS

## 2014-10-17 MED ORDER — HYDROMORPHONE HCL 1 MG/ML IJ SOLN
INTRAMUSCULAR | Status: AC
Start: 2014-10-17 — End: 2014-10-17
  Filled 2014-10-17: qty 1

## 2014-10-17 MED ORDER — PROPOFOL 10 MG/ML IV BOLUS
INTRAVENOUS | Status: AC
  Filled 2014-10-17: qty 20

## 2014-10-17 MED ORDER — FENTANYL CITRATE 0.05 MG/ML IJ SOLN
INTRAMUSCULAR | Status: AC
  Filled 2014-10-17: qty 2

## 2014-10-17 MED ORDER — DEXAMETHASONE SODIUM PHOSPHATE 4 MG/ML IJ SOLN
INTRAMUSCULAR | Status: DC | PRN
  Administered 2014-10-17: 10 mg via INTRAVENOUS

## 2014-10-17 MED ORDER — PROPOFOL 10 MG/ML IV BOLUS
INTRAVENOUS | Status: DC | PRN
  Administered 2014-10-17: 170 mg via INTRAVENOUS

## 2014-10-17 MED ORDER — ONDANSETRON HCL 4 MG/2ML IJ SOLN
INTRAMUSCULAR | Status: DC | PRN
  Administered 2014-10-17: 4 mg via INTRAVENOUS

## 2014-10-17 MED ORDER — SODIUM CHLORIDE 0.9 % IR SOLN
Status: DC | PRN
  Administered 2014-10-17: 2000 mL

## 2014-10-17 MED ORDER — HYDROCODONE-ACETAMINOPHEN 5-325 MG PO TABS: ORAL_TABLET | ORAL | Status: DC

## 2014-10-17 MED ORDER — MIDAZOLAM HCL 2 MG/2ML IJ SOLN
1.0000 mg | INTRAMUSCULAR | Status: DC | PRN
  Administered 2014-10-17: 1 mg via INTRAVENOUS

## 2014-10-17 SURGICAL SUPPLY — 44 items
BANDAGE ELASTIC 6 VELCRO ST LF (GAUZE/BANDAGES/DRESSINGS) ×3 IMPLANT
BLADE CUTTER GATOR 3.5 (BLADE) ×2 IMPLANT
BLADE GREAT WHITE 4.2 (BLADE) ×1 IMPLANT
BLADE GREAT WHITE 4.2MM (BLADE) ×1
BLADE SURG 15 STRL LF DISP TIS (BLADE) IMPLANT
BLADE SURG 15 STRL SS (BLADE)
BNDG COHESIVE 4X5 TAN STRL (GAUZE/BANDAGES/DRESSINGS) IMPLANT
CANISTER SUCT 3000ML (MISCELLANEOUS) IMPLANT
DRAPE ARTHROSCOPY W/POUCH 90 (DRAPES) ×3 IMPLANT
DURAPREP 26ML APPLICATOR (WOUND CARE) ×3 IMPLANT
GAUZE SPONGE 4X4 12PLY STRL (GAUZE/BANDAGES/DRESSINGS) ×3 IMPLANT
GAUZE XEROFORM 1X8 LF (GAUZE/BANDAGES/DRESSINGS) ×3 IMPLANT
GLOVE BIO SURGEON STRL SZ7 (GLOVE) ×1 IMPLANT
GLOVE BIOGEL PI IND STRL 7.0 (GLOVE) ×1 IMPLANT
GLOVE BIOGEL PI IND STRL 7.5 (GLOVE) ×1 IMPLANT
GLOVE BIOGEL PI INDICATOR 7.0 (GLOVE) ×2
GLOVE BIOGEL PI INDICATOR 7.5 (GLOVE) ×2
GLOVE ECLIPSE 6.5 STRL STRAW (GLOVE) ×2 IMPLANT
GLOVE EXAM NITRILE LRG STRL (GLOVE) ×2 IMPLANT
GLOVE SS BIOGEL STRL SZ 7.5 (GLOVE) ×1 IMPLANT
GLOVE SUPERSENSE BIOGEL SZ 7.5 (GLOVE) ×2
GOWN STRL REUS W/ TWL LRG LVL3 (GOWN DISPOSABLE) ×3 IMPLANT
GOWN STRL REUS W/TWL LRG LVL3 (GOWN DISPOSABLE) ×9
HOLDER KNEE FOAM BLUE (MISCELLANEOUS) ×3 IMPLANT
KNEE WRAP E Z 3 GEL PACK (MISCELLANEOUS) ×3 IMPLANT
MANIFOLD NEPTUNE II (INSTRUMENTS) ×2 IMPLANT
NDL SAFETY ECLIPSE 18X1.5 (NEEDLE) ×2 IMPLANT
NEEDLE HYPO 18GX1.5 SHARP (NEEDLE) ×3
NEEDLE HYPO 22GX1.5 SAFETY (NEEDLE) IMPLANT
PACK ARTHROSCOPY DSU (CUSTOM PROCEDURE TRAY) ×3 IMPLANT
PACK BASIN DAY SURGERY FS (CUSTOM PROCEDURE TRAY) ×3 IMPLANT
PAD ALCOHOL SWAB (MISCELLANEOUS) ×4 IMPLANT
SET ARTHROSCOPY TUBING (MISCELLANEOUS) ×3
SET ARTHROSCOPY TUBING LN (MISCELLANEOUS) ×1 IMPLANT
SUCTION FRAZIER TIP 10 FR DISP (SUCTIONS) IMPLANT
SUT ETHILON 4 0 PS 2 18 (SUTURE) ×3 IMPLANT
SUT PROLENE 3 0 PS 2 (SUTURE) IMPLANT
SUT VIC AB 3-0 PS1 18 (SUTURE)
SUT VIC AB 3-0 PS1 18XBRD (SUTURE) IMPLANT
SYR 20CC LL (SYRINGE) IMPLANT
SYR 5ML LL (SYRINGE) ×3 IMPLANT
TOWEL OR 17X24 6PK STRL BLUE (TOWEL DISPOSABLE) ×3 IMPLANT
WAND STAR VAC 90 (SURGICAL WAND) ×2 IMPLANT
WATER STERILE IRR 1000ML POUR (IV SOLUTION) ×3 IMPLANT

## 2014-10-17 NOTE — Anesthesia Preprocedure Evaluation (Addendum)
Anesthesia Evaluation  Patient identified by MRN, date of birth, ID band Patient awake    Reviewed: Allergy & Precautions, H&P , NPO status , Patient's Chart, lab work & pertinent test results  History of Anesthesia Complications Negative for: history of anesthetic complications  Airway Mallampati: II       Dental  (+) Teeth Intact   Pulmonary neg pulmonary ROS,  breath sounds clear to auscultation        Cardiovascular hypertension, Pt. on medications Rhythm:Regular Rate:Normal     Neuro/Psych negative neurological ROS  negative psych ROS   GI/Hepatic   Endo/Other    Renal/GU      Musculoskeletal  (+) Arthritis -,   Abdominal   Peds  Hematology   Anesthesia Other Findings   Reproductive/Obstetrics                            Anesthesia Physical Anesthesia Plan  ASA: II  Anesthesia Plan: General   Post-op Pain Management:    Induction: Intravenous  Airway Management Planned: LMA  Additional Equipment:   Intra-op Plan:   Post-operative Plan: Extubation in OR  Informed Consent: I have reviewed the patients History and Physical, chart, labs and discussed the procedure including the risks, benefits and alternatives for the proposed anesthesia with the patient or authorized representative who has indicated his/her understanding and acceptance.   Dental advisory given  Plan Discussed with: CRNA and Surgeon  Anesthesia Plan Comments:         Anesthesia Quick Evaluation

## 2014-10-17 NOTE — Progress Notes (Signed)
Assisted Dr. Orene Desanctis with left, knee block. Side rails up, monitors on throughout procedure. See vital signs in flow sheet. Tolerated Procedure well.

## 2014-10-17 NOTE — Transfer of Care (Signed)
Immediate Anesthesia Transfer of Care Note  Patient: Nancy Steele  Procedure(s) Performed: Procedure(s): LEFT KNEE ARTHROSCOPY WITH MEDIAL MENISCECTOMY (Left) CHONDROPLASTY (Left) KNEE ARTHROSCOPY WITH LATERAL MENISECTOMY (Left)  Patient Location: PACU  Anesthesia Type:General  Level of Consciousness: sedated  Airway & Oxygen Therapy: Patient Spontanous Breathing and Patient connected to face mask oxygen  Post-op Assessment: Report given to PACU RN and Post -op Vital signs reviewed and stable  Post vital signs: Reviewed and stable  Complications: No apparent anesthesia complications

## 2014-10-17 NOTE — Op Note (Signed)
NAMEREYES, FIFIELD              ACCOUNT NO.:  [id number]  MEDICAL RECORD NO.:  [id number]  LOCATION:                               FACILITY:  [facility]  PHYSICIAN:  Keyla Milone A. Noemi Chapel, M.D. DATE OF BIRTH:  1956-02-08  DATE OF PROCEDURE:  10/17/2014 DATE OF DISCHARGE:  10/17/2014                              OPERATIVE REPORT   PREOPERATIVE DIAGNOSES: 1. Left knee chronic nontraumatic medial and lateral meniscal tears. 2. Left knee primary localized osteoarthritis.  POSTOPERATIVE DIAGNOSES: 1. Left knee chronic nontraumatic medial and lateral meniscal tears. 2. Left knee primary localized osteoarthritis.  PROCEDURE: 1. Left knee EUA followed by arthroscopic partial medial     meniscectomies. 2. Left knee tricompartmental chondroplasty.  SURGEON:  Audree Camel. Noemi Chapel, M.D.  ANESTHESIA:  General.  OPERATIVE TIME:  40 minutes.  COMPLICATIONS:  None.  INDICATION FOR PROCEDURE:  Ms. Winner is a 58 year old woman, who has had 6-9 months of increasing left knee pain with exam and MRI documenting meniscal tearing with chondromalacia.  She has failed multiple conservative modalities and is now to undergo arthroscopy.  DESCRIPTION OF PROCEDURE:  Ms. Urias was brought to the operating room on October 17, 2014, after knee block was placed on room by Anesthesia.  She was placed on the operative table in supine position. She received antibiotics preoperatively for prophylaxis.  After being placed under general anesthesia, her left knee was examined.  She had full range of motion.  Knee was stable.  Ligamentous exam with normal patellar tracking.  Left leg was prepped using sterile DuraPrep and draped using sterile technique.  Time-out procedure was called and the correct left knee was identified.  Initially through an anterolateral portal, the arthroscope with a pump attached was placed into an anteromedial portal and arthroscopic probe was placed.  On initial inspection medial  compartment, she had 80% grade 3 chondromalacia, which was debrided.  Medial meniscus showed tearing of the posterior medial horn of which 50% was resected back to a stable rim.  Intercondylar notch inspected, anterior and posterior cruciate ligaments were normal. Lateral compartment inspected.  She had 30% grade 3 chondromalacia, which was debrided.  Lateral meniscus tearing 30% posterolateral horn, which was resected back to a stable rim.  Patellofemoral joint showed 30%-40% grade 3 chondromalacia, which was debrided.  The patella tracked normally.  Medial and lateral gutters were free of pathology.  At this point, it was felt that all pathology had been satisfactorily addressed. The instruments removed.  Portals were closed with 3-0 nylon suture. Sterile dressings were applied.  The patient was awakened and taken to recovery in stable condition.  FOLLOW UP CARE:  Ms. Meadow will be followed as an outpatient on Norco for pain.  See back in the office in a week for sutures out and followup.     Sabrie Moritz A. Noemi Chapel, M.D.     RAW/MEDQ  D:  10/17/2014  T:  10/17/2014  Job:  [id number]

## 2014-10-17 NOTE — Anesthesia Procedure Notes (Addendum)
Anesthesia Regional Block:  Knee block  Pre-Anesthetic Checklist: ,, timeout performed, Correct Patient, Correct Site, Correct Laterality, Correct Procedure, Correct Position, site marked, Risks and benefits discussed,  Surgical consent,  Pre-op evaluation,  At surgeon's request and post-op pain management  Laterality: Left and Upper  Prep: chloraprep       Needles:      Needle Length: 4cm 4 cm Needle Gauge: 22 and 22 G  Needle insertion depth: 3 cm   Additional Needles: Knee block Narrative:  Start time: 10/17/2014 7:50 AM End time: 10/17/2014 8:01 AM Injection made incrementally with aspirations every 5 mL.  Performed by: Personally  Anesthesiologist: MASSAGEE, TERRY  Additional Notes: Two inferior portals injected. Xylocaine/Marcaine 12cc each. Tolerated well   Procedure Name: LMA Insertion Date/Time: 10/17/2014 8:43 AM Performed by: Maryella Shivers Pre-anesthesia Checklist: Patient identified, Emergency Drugs available, Suction available and Patient being monitored Patient Re-evaluated:Patient Re-evaluated prior to inductionOxygen Delivery Method: Circle System Utilized Preoxygenation: Pre-oxygenation with 100% oxygen Intubation Type: IV induction Ventilation: Mask ventilation without difficulty LMA: LMA inserted LMA Size: 4.0 Number of attempts: 1 Airway Equipment and Method: bite block Placement Confirmation: positive ETCO2 Tube secured with: Tape Dental Injury: Teeth and Oropharynx as per pre-operative assessment

## 2014-10-17 NOTE — Interval H&P Note (Signed)
History and Physical Interval Note:  10/17/2014 8:33 AM  Nancy Steele  has presented today for surgery, with the diagnosis of TEAR OF MEDIAL MENISCUS CURRENT INJURY UNSPECIFIED KNEE INITIAL ENCOUNTER LEFT KNEE   The various methods of treatment have been discussed with the patient and family. After consideration of risks, benefits and other options for treatment, the patient has consented to  Procedure(s): LEFT KNEE ARTHROSCOPY WITH MEDIAL MENISCECTOMY (Left) as a surgical intervention .  The patient's history has been reviewed, patient examined, no change in status, stable for surgery.  I have reviewed the patient's chart and labs.  Questions were answered to the patient's satisfaction.     Elsie Saas A

## 2014-10-17 NOTE — Anesthesia Postprocedure Evaluation (Signed)
  Anesthesia Post-op Note  Patient: Nancy Steele  Procedure(s) Performed: Procedure(s): LEFT KNEE ARTHROSCOPY WITH MEDIAL MENISCECTOMY (Left) CHONDROPLASTY (Left) KNEE ARTHROSCOPY WITH LATERAL MENISECTOMY (Left)  Patient Location: PACU  Anesthesia Type:GA combined with regional for post-op pain  Level of Consciousness: awake and alert   Airway and Oxygen Therapy: Patient Spontanous Breathing  Post-op Pain: none  Post-op Assessment: Post-op Vital signs reviewed  Post-op Vital Signs: stable  Last Vitals:  Filed Vitals:   10/17/14 1043  BP: 111/66  Pulse: 83  Temp: 36.4 C  Resp: 16    Complications: No apparent anesthesia complications

## 2014-10-17 NOTE — Discharge Instructions (Signed)
°  Post Anesthesia Home Care Instructions ° °Activity: °Get plenty of rest for the remainder of the day. A responsible adult should stay with you for 24 hours following the procedure.  °For the next 24 hours, DO NOT: °-Drive a car °-Operate machinery °-Drink alcoholic beverages °-Take any medication unless instructed by your physician °-Make any legal decisions or sign important papers. ° °Meals: °Start with liquid foods such as gelatin or soup. Progress to regular foods as tolerated. Avoid greasy, spicy, heavy foods. If nausea and/or vomiting occur, drink only clear liquids until the nausea and/or vomiting subsides. Call your physician if vomiting continues. ° °Special Instructions/Symptoms: °Your throat may feel dry or sore from the anesthesia or the breathing tube placed in your throat during surgery. If this causes discomfort, gargle with warm salt water. The discomfort should disappear within 24 hours. ° °Regional Anesthesia Blocks ° °1. Numbness or the inability to move the "blocked" extremity may last from 3-48 hours after placement. The length of time depends on the medication injected and your individual response to the medication. If the numbness is not going away after 48 hours, call your surgeon. ° °2. The extremity that is blocked will need to be protected until the numbness is gone and the  Strength has returned. Because you cannot feel it, you will need to take extra care to avoid injury. Because it may be weak, you may have difficulty moving it or using it. You may not know what position it is in without looking at it while the block is in effect. ° °3. For blocks in the legs and feet, returning to weight bearing and walking needs to be done carefully. You will need to wait until the numbness is entirely gone and the strength has returned. You should be able to move your leg and foot normally before you try and bear weight or walk. You will need someone to be with you when you first try to ensure you  do not fall and possibly risk injury. ° °4. Bruising and tenderness at the needle site are common side effects and will resolve in a few days. ° °5. Persistent numbness or new problems with movement should be communicated to the surgeon or the [facility] ([phone])/ [facility] ([phone]). °

## 2014-10-18 ENCOUNTER — Encounter (HOSPITAL_BASED_OUTPATIENT_CLINIC_OR_DEPARTMENT_OTHER): Payer: Self-pay | Admitting: Orthopedic Surgery

## 2015-08-12 ENCOUNTER — Other Ambulatory Visit: Payer: Self-pay | Admitting: Family Medicine

## 2015-08-28 ENCOUNTER — Other Ambulatory Visit: Payer: Self-pay

## 2015-08-28 DIAGNOSIS — Z1231 Encounter for screening mammogram for malignant neoplasm of breast: Secondary | ICD-10-CM

## 2015-09-20 ENCOUNTER — Ambulatory Visit
Admission: RE | Admit: 2015-09-20 | Discharge: 2015-09-20 | Disposition: A | Discharge status: Home / Self Care | Payer: Commercial Managed Care - HMO | Source: Ambulatory Visit

## 2015-09-20 DIAGNOSIS — Z1231 Encounter for screening mammogram for malignant neoplasm of breast: Secondary | ICD-10-CM

## 2015-11-21 ENCOUNTER — Encounter (HOSPITAL_COMMUNITY): Payer: Self-pay | Admitting: Emergency Medicine

## 2015-11-21 ENCOUNTER — Emergency Department (INDEPENDENT_AMBULATORY_CARE_PROVIDER_SITE_OTHER)
Admission: EM | Admit: 2015-11-21 | Discharge: 2015-11-21 | Disposition: A | Discharge status: Home / Self Care | Payer: Commercial Managed Care - HMO | Source: Home / Self Care | Attending: Emergency Medicine | Admitting: Emergency Medicine

## 2015-11-21 DIAGNOSIS — J018 Other acute sinusitis: Secondary | ICD-10-CM

## 2015-11-21 MED ORDER — PREDNISONE 50 MG PO TABS: ORAL_TABLET | ORAL | Status: DC

## 2015-11-21 MED ORDER — AZITHROMYCIN 250 MG PO TABS: ORAL_TABLET | ORAL | Status: DC

## 2015-11-21 NOTE — ED Provider Notes (Signed)
CSN: [id number]     Arrival date & time 11/21/15  1623 History   First MD Initiated Contact with Patient 11/21/15 1721     Chief Complaint  Patient presents with  . Nasal Congestion  . Cough  . Headache   (Consider location/radiation/quality/duration/timing/severity/associated sxs/prior Treatment) HPI  She is a 60 year old woman here for evaluation of sinus pressure. She states her symptoms started about a month ago with some nasal congestion, rhinorrhea, and sinus issues. She started taking her allergy medications, including over-the-counter pill and Nasacort. Her symptoms improved temporarily, but then worsened about 2 weeks ago. She reports sinus pressure, nasal congestion, bilateral ear pain and popping, and cough. She reports subjective fevers, but has not taken her temperature. She has tried multiple over-the-counter medications including Mucinex, Zicam, and Netty pot with minimal improvement.  Past Medical History  Diagnosis Date  . Allergy     seasonal  . Arthritis   . Hypertension   . DDD (degenerative disc disease), cervical   . Acute medial meniscus tear of left knee    Past Surgical History  Procedure Laterality Date  . Knee arthroscopy      bilateral  . Cervical fusion  2000  . Colonoscopy    . Upper gi endoscopy    . Knee arthroscopy with medial menisectomy Left 10/17/2014    Procedure: LEFT KNEE ARTHROSCOPY WITH MEDIAL MENISCECTOMY;  Surgeon: Lorn Junes, MD;  Location: [place];  Service: Orthopedics;  Laterality: Left;  . Chondroplasty Left 10/17/2014    Procedure: CHONDROPLASTY;  Surgeon: Lorn Junes, MD;  Location: [place];  Service: Orthopedics;  Laterality: Left;  . Knee arthroscopy with lateral menisectomy Left 10/17/2014    Procedure: KNEE ARTHROSCOPY WITH LATERAL MENISECTOMY;  Surgeon: Lorn Junes, MD;  Location: [facility];  Service: Orthopedics;  Laterality: Left;   Family History  Problem  Relation Age of Onset  . Diabetes    . Hypertension    . Arthritis     Social History  Substance Use Topics  . Smoking status: Never Smoker   . Smokeless tobacco: Never Used  . Alcohol Use: Yes     Comment: occasional   OB History    No data available     Review of Systems As in history of present illness Allergies  Vancomycin; Penicillins; and Sulfa antibiotics  Home Medications   Prior to Admission medications   Medication Sig Start Date End Date Taking? Authorizing Provider  aspirin 81 MG tablet Take 81 mg by mouth daily.   Yes Historical Provider, MD  Calcium Carb-Cholecalciferol (CALTRATE 600+D) 600-800 MG-UNIT TABS Take by mouth.   Yes Historical Provider, MD  lisinopril (PRINIVIL,ZESTRIL) 20 MG tablet Take 1 tablet (20 mg total) by mouth daily. Needs office visit and labs before further refills 08/12/15  Yes Susy Frizzle, MD  Travoprost, BAK Free, (TRAVATAN) 0.004 % SOLN ophthalmic solution Place 1 drop into the left eye at bedtime.   Yes Historical Provider, MD  azithromycin (ZITHROMAX Z-PAK) 250 MG tablet Take 2 pills today, then 1 pill daily until gone. 11/21/15   Melony Overly, MD  HYDROcodone-acetaminophen [facility]) 5-325 MG per tablet 1-2 tablets every 4-6 hrs as needed for pain 10/17/14   Kirstin Shepperson, PA-C  predniSONE (DELTASONE) 50 MG tablet Take 1 pill daily for 5 days. 11/21/15   Melony Overly, MD   Meds Ordered and Administered this Visit  Medications - No data to display  BP 170/95  mmHg  Pulse 70  Temp(Src) 98.6 F (37 C) (Oral)  Resp 16  SpO2 100% No data found.   Physical Exam  Constitutional: She is oriented to person, place, and time. She appears well-developed and well-nourished. No distress.  HENT:  Mouth/Throat: Oropharynx is clear and moist. No oropharyngeal exudate.  Nasal mucosa is edematous and erythematous. Right TM with clear effusion. Left TM is slightly erythematous, but light reflexes present.  Neck: Neck supple.   Cardiovascular: Normal rate, regular rhythm and normal heart sounds.   No murmur heard. Pulmonary/Chest: Effort normal and breath sounds normal. No respiratory distress. She has no wheezes. She has no rales.  Lymphadenopathy:    She has cervical adenopathy (left anterior chain).  Neurological: She is alert and oriented to person, place, and time.    ED Course  Procedures (including critical care time)  Labs Review Labs Reviewed - No data to display  Imaging Review No results found.    MDM   1. Other acute sinusitis    Given duration of symptoms, will treat with azithromycin and prednisone. Recommended that she continue her Nasacort. Also recommended avoiding any over-the-counter medications that have decongestants in them as this can elevate her blood pressure. Follow-up as needed.    Melony Overly, MD 11/21/15 1740

## 2015-11-21 NOTE — Discharge Instructions (Signed)
You have a sinus infection. Please continue Nasacort. Take prednisone and azithromycin as prescribed. Avoid over-the-counter medications that have decongestants in them as this can raise your blood pressure. Follow-up as needed.

## 2015-11-21 NOTE — ED Notes (Signed)
The patient presented to the [facility] with a complaint of a cough, bilateral otalgia, nasal congestion and a headache x 3 to 4 weeks.

## 2015-12-14 ENCOUNTER — Other Ambulatory Visit: Payer: Self-pay | Admitting: Family Medicine

## 2016-03-30 ENCOUNTER — Encounter: Payer: Self-pay | Admitting: Internal Medicine

## 2016-04-22 ENCOUNTER — Encounter: Payer: Self-pay | Admitting: Internal Medicine

## 2016-06-05 ENCOUNTER — Encounter (INDEPENDENT_AMBULATORY_CARE_PROVIDER_SITE_OTHER): Payer: Self-pay

## 2016-06-05 ENCOUNTER — Ambulatory Visit (AMBULATORY_SURGERY_CENTER): Payer: Self-pay | Admitting: *Deleted

## 2016-06-05 VITALS — Ht 68.0 in | Wt 211.0 lb

## 2016-06-05 DIAGNOSIS — Z86010 Personal history of colonic polyps: Secondary | ICD-10-CM

## 2016-06-05 MED ORDER — NA SULFATE-K SULFATE-MG SULF 17.5-3.13-1.6 GM/177ML PO SOLN: 1.0000 | Freq: Once | ORAL | 0 refills | Status: AC

## 2016-06-05 NOTE — Progress Notes (Signed)
No egg or soy allergy known to patient  No issues with past sedation with any surgeries  or procedures, no intubation problems  No diet pills per patient No home 02 use per patient  No blood thinners per patient  Pt denies issues with constipation  No A fib or flutter

## 2016-06-09 ENCOUNTER — Encounter: Payer: Self-pay | Admitting: Internal Medicine

## 2016-06-19 ENCOUNTER — Encounter: Payer: Self-pay | Admitting: Internal Medicine

## 2016-06-19 ENCOUNTER — Ambulatory Visit (AMBULATORY_SURGERY_CENTER): Payer: Commercial Managed Care - HMO | Admitting: Internal Medicine

## 2016-06-19 VITALS — BP 116/70 | HR 62 | Temp 97.5°F | Resp 11 | Ht 68.0 in | Wt 211.0 lb

## 2016-06-19 DIAGNOSIS — Z86010 Personal history of colonic polyps: Secondary | ICD-10-CM

## 2016-06-19 DIAGNOSIS — D122 Benign neoplasm of ascending colon: Secondary | ICD-10-CM | POA: Diagnosis not present

## 2016-06-19 MED ORDER — SODIUM CHLORIDE 0.9 % IV SOLN: 500.0000 mL | INTRAVENOUS | Status: DC

## 2016-06-19 NOTE — Op Note (Signed)
[facility] Patient Name: Nancy Steele Procedure Date: 06/19/2016 11:19 AM MRN: [id number] Endoscopist: Docia Chuck. Henrene Pastor , MD Age: 60 Referring MD:  Date of Birth: 1955/11/16 Gender: Female Account #: [id number] Procedure:                Colonoscopy, with cold snare polypectomy asked to Indications:              High risk colon cancer surveillance: Personal                            history of sessile serrated colon polyp (less than                            10 mm in size) with no dysplasia. Previous exam                            June 2012 Medicines:                Monitored Anesthesia Care Procedure:                Pre-Anesthesia Assessment:                           - Prior to the procedure, a History and Physical                            was performed, and patient medications and                            allergies were reviewed. The patient's tolerance of                            previous anesthesia was also reviewed. The risks                            and benefits of the procedure and the sedation                            options and risks were discussed with the patient.                            All questions were answered, and informed consent                            was obtained. Prior Anticoagulants: The patient has                            taken no previous anticoagulant or antiplatelet                            agents. ASA Grade Assessment: II - A patient with                            mild systemic disease. After reviewing the risks  and benefits, the patient was deemed in                            satisfactory condition to undergo the procedure.                           After obtaining informed consent, the colonoscope                            was passed under direct vision. Throughout the                            procedure, the patient's blood pressure, pulse, and                            oxygen saturations  were monitored continuously. The                            Model PCF-H190L [id number]) scope was introduced                            through the anus and advanced to the the cecum,                            identified by appendiceal orifice and ileocecal                            valve. The ileocecal valve, appendiceal orifice,                            and rectum were photographed. The quality of the                            bowel preparation was excellent. The colonoscopy                            was performed without difficulty. The patient                            tolerated the procedure well. The bowel preparation                            used was SUPREP. Scope In: 11:26:09 AM Scope Out: 11:39:59 AM Scope Withdrawal Time: 0 hours 11 minutes 44 seconds  Total Procedure Duration: 0 hours 13 minutes 50 seconds  Findings:                 Two sessile polyps were found in the ascending                            colon. The polyps were 3 to 6 mm in size. These                            polyps were removed with a cold snare. Resection  and retrieval were complete.                           Multiple diverticula were found in the sigmoid                            colon.                           The exam was otherwise without abnormality on                            direct and retroflexion views. Complications:            No immediate complications. Estimated blood loss:                            None. Estimated Blood Loss:     Estimated blood loss: none. Impression:               - Two 3 to 6 mm polyps in the ascending colon,                            removed with a cold snare. Resected and retrieved.                           - Diverticulosis in the sigmoid colon.                           - The examination was otherwise normal on direct                            and retroflexion views. Recommendation:           - Repeat colonoscopy in 5 years  for surveillance.                           - Patient has a contact number available for                            emergencies. The signs and symptoms of potential                            delayed complications were discussed with the                            patient. Return to normal activities tomorrow.                            Written discharge instructions were provided to the                            patient.                           - Resume previous diet.                           -  Continue present medications.                           - Await pathology results. Docia Chuck. Henrene Pastor, MD 06/19/2016 11:44:04 AM This report has been signed electronically.

## 2016-06-19 NOTE — Patient Instructions (Signed)
YOU HAD AN ENDOSCOPIC PROCEDURE TODAY AT THE [facility]:   Refer to the procedure report that was given to you for any specific questions about what was found during the examination.  If the procedure report does not answer your questions, please call your gastroenterologist to clarify.  If you requested that your care partner not be given the details of your procedure findings, then the procedure report has been included in a sealed envelope for you to review at your convenience later.  YOU SHOULD EXPECT: Some feelings of bloating in the abdomen. Passage of more gas than usual.  Walking can help get rid of the air that was put into your GI tract during the procedure and reduce the bloating. If you had a lower endoscopy (such as a colonoscopy or flexible sigmoidoscopy) you may notice spotting of blood in your stool or on the toilet paper. If you underwent a bowel prep for your procedure, you may not have a normal bowel movement for a few days.  Please Note:  You might notice some irritation and congestion in your nose or some drainage.  This is from the oxygen used during your procedure.  There is no need for concern and it should clear up in a day or so.  SYMPTOMS TO REPORT IMMEDIATELY:   Following lower endoscopy (colonoscopy or flexible sigmoidoscopy):  Excessive amounts of blood in the stool  Significant tenderness or worsening of abdominal pains  Swelling of the abdomen that is new, acute  Fever of 100F or higher  For urgent or emergent issues, a gastroenterologist can be reached at any hour by calling [phone].   DIET:  We do recommend a small meal at first, but then you may proceed to your regular diet.  Drink plenty of fluids but you should avoid alcoholic beverages for 24 hours.  ACTIVITY:  You should plan to take it easy for the rest of today and you should NOT DRIVE or use heavy machinery until tomorrow (because of the sedation medicines used during the test).     FOLLOW UP: Our staff will call the number listed on your records the next business day following your procedure to check on you and address any questions or concerns that you may have regarding the information given to you following your procedure. If we do not reach you, we will leave a message.  However, if you are feeling well and you are not experiencing any problems, there is no need to return our call.  We will assume that you have returned to your regular daily activities without incident.  If any biopsies were taken you will be contacted by phone or by letter within the next 1-3 weeks.  Please call us at [phone] if you have not heard about the biopsies in 3 weeks.    SIGNATURES/CONFIDENTIALITY: You and/or your care partner have signed paperwork which will be entered into your electronic medical record.  These signatures attest to the fact that that the information above on your After Visit Summary has been reviewed and is understood.  Full responsibility of the confidentiality of this discharge information lies with you and/or your care-partner.  Polyp,diverticulosis, and high fiber diet information given.  

## 2016-06-19 NOTE — Progress Notes (Signed)
Called to room to assist during endoscopic procedure.  Patient ID and intended procedure confirmed with present staff. Received instructions for my participation in the procedure from the performing physician.  

## 2016-06-19 NOTE — Progress Notes (Signed)
Report to PACU, RN, vss, BBS= Clear.  

## 2016-06-23 ENCOUNTER — Telehealth: Payer: Self-pay

## 2016-06-23 NOTE — Telephone Encounter (Signed)
  Follow up Call-  Call back number 06/19/2016  Post procedure Call Back phone  # [phone]  Permission to leave phone message Yes  Some recent data might be hidden     Patient questions:  Do you have a fever, pain , or abdominal swelling? No. Pain Score  0 *  Have you tolerated food without any problems? Yes.    Have you been able to return to your normal activities? Yes.    Do you have any questions about your discharge instructions: Diet   No. Medications  No. Follow up visit  No.  Do you have questions or concerns about your Care? No.  Actions: * If pain score is 4 or above: No action needed, pain <4.  Pt was not at home.  I spoke with Octavia Bruckner, her husband.  He reported pt "had a great weekend".  I asked him to please let her know we called to check on her and to call us back if any questions or concerns. maw

## 2016-06-24 ENCOUNTER — Encounter: Payer: Self-pay | Admitting: Internal Medicine

## 2016-08-25 ENCOUNTER — Encounter: Payer: Self-pay | Admitting: Family Medicine

## 2016-08-25 ENCOUNTER — Ambulatory Visit (INDEPENDENT_AMBULATORY_CARE_PROVIDER_SITE_OTHER): Payer: Commercial Managed Care - HMO | Admitting: Family Medicine

## 2016-08-25 VITALS — BP 114/76 | HR 78 | Temp 98.7°F | Resp 14 | Ht 68.0 in | Wt 207.0 lb

## 2016-08-25 DIAGNOSIS — I10 Essential (primary) hypertension: Secondary | ICD-10-CM | POA: Diagnosis not present

## 2016-08-25 LAB — COMPLETE METABOLIC PANEL WITH GFR
ALT: 21 U/L (ref 6–29)
AST: 20 U/L (ref 10–35)
Albumin: 4.6 g/dL (ref 3.6–5.1)
Alkaline Phosphatase: 56 U/L (ref 33–130)
BUN: 17 mg/dL (ref 7–25)
CALCIUM: 9.7 mg/dL (ref 8.6–10.4)
CHLORIDE: 105 mmol/L (ref 98–110)
CO2: 26 mmol/L (ref 20–31)
CREATININE: 0.95 mg/dL (ref 0.50–1.05)
GFR, Est African American: 76 mL/min (ref >=60)
GFR, Est Non African American: 66 mL/min (ref >=60)
GLUCOSE: 94 mg/dL (ref 70–99)
Potassium: 4.3 mmol/L (ref 3.5–5.3)
SODIUM: 141 mmol/L (ref 135–146)
Total Bilirubin: 0.8 mg/dL (ref 0.2–1.2)
Total Protein: 6.9 g/dL (ref 6.1–8.1)

## 2016-08-25 LAB — CBC WITH DIFFERENTIAL/PLATELET
BASOS PCT: 1 %
Basophils Absolute: 40 cells/uL (ref 0–200)
EOS PCT: 3 %
Eosinophils Absolute: 120 cells/uL (ref 15–500)
HEMATOCRIT: 38 % (ref 35.0–45.0)
Hemoglobin: 12.6 g/dL (ref 12.0–15.0)
LYMPHS PCT: 41 %
Lymphs Abs: 1640 cells/uL (ref 850–3900)
MCH: 30.3 pg (ref 27.0–33.0)
MCHC: 33.2 g/dL (ref 32.0–36.0)
MCV: 91.3 fL (ref 80.0–100.0)
MONO ABS: 280 {cells}/uL (ref 200–950)
MPV: 11.1 fL (ref 7.5–12.5)
Monocytes Relative: 7 %
Neutro Abs: 1920 cells/uL (ref 1500–7800)
Neutrophils Relative %: 48 %
PLATELETS: 219 10*3/uL (ref 140–400)
RBC: 4.16 MIL/uL (ref 3.80–5.10)
RDW: 13.2 % (ref 11.0–15.0)
WBC: 4 10*3/uL (ref 3.8–10.8)

## 2016-08-25 LAB — LIPID PANEL
CHOL/HDL RATIO: 2.9 ratio (ref <5.0)
Cholesterol: 217 mg/dL — ABNORMAL HIGH (ref <200)
HDL: 76 mg/dL (ref >50)
LDL CALC: 126 mg/dL — AB
Triglycerides: 73 mg/dL (ref <150)
VLDL: 15 mg/dL (ref <30)

## 2016-08-25 MED ORDER — LISINOPRIL 10 MG PO TABS: 10.0000 mg | ORAL_TABLET | Freq: Every day | ORAL | 3 refills | Status: DC

## 2016-08-25 NOTE — Progress Notes (Signed)
..  [id number] 

## 2016-08-25 NOTE — Progress Notes (Signed)
Subjective:    Patient ID: Nancy Steele, female    DOB: 09-05-1956, 60 y.o.   MRN: [id number]  HPI Patient has a history of hypertension. She is currently taking lisinopril 10 mg by mouth daily. Her blood pressure is well controlled. She denies any chest pain shortness of breath or dyspnea on exertion. Her mammogram and Pap smear performed at her gynecologist. Her colonoscopy is up-to-date. Immunizations are up-to-date. She declines hepatitis C and HIV screening. She is taking naproxen every day due to pain in her knees. Therefore I recommended that she discontinue aspirin given the fact she is a low risk patient. Past Medical History:  Diagnosis Date  . Acute medial meniscus tear of left knee   . Allergy    seasonal  . Arthritis   . Blood transfusion without reported diagnosis    28 yrs ago  . Cataract    left eye-removal with implant   . DDD (degenerative disc disease), cervical   . Glaucoma    glucoma like issue- cuased from past surgery increasing pressurein the eye   . Hypertension    Past Surgical History:  Procedure Laterality Date  . CARPAL TUNNEL RELEASE     bilateral  . CERVICAL FUSION  2000  . CHONDROPLASTY Left 10/17/2014   Procedure: CHONDROPLASTY;  Surgeon: Lorn Junes, MD;  Location: [place];  Service: Orthopedics;  Laterality: Left;  . COLONOSCOPY    . KNEE ARTHROSCOPY     bilateral  . KNEE ARTHROSCOPY WITH LATERAL MENISECTOMY Left 10/17/2014   Procedure: KNEE ARTHROSCOPY WITH LATERAL MENISECTOMY;  Surgeon: Lorn Junes, MD;  Location: [facility] [place];  Service: Orthopedics;  Laterality: Left;  . KNEE ARTHROSCOPY WITH MEDIAL MENISECTOMY Left 10/17/2014   Procedure: LEFT KNEE ARTHROSCOPY WITH MEDIAL MENISCECTOMY;  Surgeon: Lorn Junes, MD;  Location: [facility];  Service: Orthopedics;  Laterality: Left;  . POLYPECTOMY    . UPPER GI ENDOSCOPY     pt states she has never had an EGD in the past    Current  Outpatient Prescriptions on File Prior to Visit  Medication Sig Dispense Refill  . acetaminophen (TYLENOL) 500 MG tablet Take 500 mg by mouth every 6 (six) hours as needed.    Marland Kitchen aspirin 81 MG tablet Take 81 mg by mouth daily.    . bimatoprost (LUMIGAN) 0.01 % SOLN Place 1 drop into the left eye at bedtime.    . Calcium Carb-Cholecalciferol (CALTRATE 600+D) 600-800 MG-UNIT TABS Take by mouth.    . naproxen sodium (ANAPROX) 220 MG tablet Take 220 mg by mouth 2 (two) times daily with a meal.     Current Facility-Administered Medications on File Prior to Visit  Medication Dose Route Frequency Provider Last Rate Last Dose  . 0.9 %  sodium chloride infusion  500 mL Intravenous Continuous Irene Shipper, MD       Allergies  Allergen Reactions  . Vancomycin Hives  . Penicillins Swelling and Rash  . Sulfa Antibiotics Nausea And Vomiting   Social History   Social History  . Marital status: Married    Spouse name: N/A  . Number of children: N/A  . Years of education: N/A   Occupational History  . Not on file.   Social History Main Topics  . Smoking status: Never Smoker  . Smokeless tobacco: Never Used  . Alcohol use Yes     Comment: occasional  . Drug use: No  . Sexual activity: Not  on file   Other Topics Concern  . Not on file   Social History Narrative  . No narrative on file      Review of Systems  All other systems reviewed and are negative.      Objective:   Physical Exam  Constitutional: She appears well-developed and well-nourished.  Neck: No JVD present. No thyromegaly present.  Cardiovascular: Normal rate, regular rhythm, normal heart sounds and intact distal pulses.   No murmur heard. Pulmonary/Chest: Effort normal and breath sounds normal. No respiratory distress. She has no wheezes. She has no rales.  Abdominal: Soft. Bowel sounds are normal. She exhibits no distension. There is no tenderness. There is no rebound and no guarding.  Musculoskeletal: She  exhibits no edema.  Lymphadenopathy:    She has no cervical adenopathy.  Vitals reviewed.         Assessment & Plan:  Benign essential HTN - Plan: CBC with Differential/Platelet, COMPLETE METABOLIC PANEL WITH GFR, Lipid panel  Blood pressure is outstanding. I will make no changes in her blood pressure medication at this time. Continue lisinopril 10 mg by mouth daily. I will check a CBC, CMP, fasting lipid panel. Goal LDL cholesterol is less than 1:30. Immunizations are up-to-date and cancer screening is up-to-date

## 2016-11-25 ENCOUNTER — Other Ambulatory Visit: Payer: Self-pay | Admitting: Obstetrics

## 2016-11-25 DIAGNOSIS — Z1231 Encounter for screening mammogram for malignant neoplasm of breast: Secondary | ICD-10-CM

## 2016-12-04 ENCOUNTER — Ambulatory Visit
Admission: RE | Admit: 2016-12-04 | Discharge: 2016-12-04 | Disposition: A | Discharge status: Home / Self Care | Payer: Commercial Managed Care - HMO | Source: Ambulatory Visit | Attending: Obstetrics | Admitting: Obstetrics

## 2016-12-04 DIAGNOSIS — Z1231 Encounter for screening mammogram for malignant neoplasm of breast: Secondary | ICD-10-CM | POA: Diagnosis not present

## 2016-12-17 DIAGNOSIS — H04123 Dry eye syndrome of bilateral lacrimal glands: Secondary | ICD-10-CM | POA: Diagnosis not present

## 2016-12-17 DIAGNOSIS — H40052 Ocular hypertension, left eye: Secondary | ICD-10-CM | POA: Diagnosis not present

## 2016-12-17 DIAGNOSIS — H2511 Age-related nuclear cataract, right eye: Secondary | ICD-10-CM | POA: Diagnosis not present

## 2017-03-25 DIAGNOSIS — H2511 Age-related nuclear cataract, right eye: Secondary | ICD-10-CM | POA: Diagnosis not present

## 2017-03-25 DIAGNOSIS — H04123 Dry eye syndrome of bilateral lacrimal glands: Secondary | ICD-10-CM | POA: Diagnosis not present

## 2017-03-25 DIAGNOSIS — H40052 Ocular hypertension, left eye: Secondary | ICD-10-CM | POA: Diagnosis not present

## 2017-04-08 DIAGNOSIS — Z961 Presence of intraocular lens: Secondary | ICD-10-CM | POA: Diagnosis not present

## 2017-04-08 DIAGNOSIS — H4042X2 Glaucoma secondary to eye inflammation, left eye, moderate stage: Secondary | ICD-10-CM | POA: Diagnosis not present

## 2017-04-08 DIAGNOSIS — Z9842 Cataract extraction status, left eye: Secondary | ICD-10-CM | POA: Diagnosis not present

## 2017-05-12 DIAGNOSIS — H2511 Age-related nuclear cataract, right eye: Secondary | ICD-10-CM | POA: Diagnosis not present

## 2017-05-12 DIAGNOSIS — H04123 Dry eye syndrome of bilateral lacrimal glands: Secondary | ICD-10-CM | POA: Diagnosis not present

## 2017-05-12 DIAGNOSIS — H40052 Ocular hypertension, left eye: Secondary | ICD-10-CM | POA: Diagnosis not present

## 2017-07-01 DIAGNOSIS — H40052 Ocular hypertension, left eye: Secondary | ICD-10-CM | POA: Diagnosis not present

## 2017-07-01 DIAGNOSIS — H2511 Age-related nuclear cataract, right eye: Secondary | ICD-10-CM | POA: Diagnosis not present

## 2017-07-01 DIAGNOSIS — H04123 Dry eye syndrome of bilateral lacrimal glands: Secondary | ICD-10-CM | POA: Diagnosis not present

## 2017-08-05 DIAGNOSIS — Z01419 Encounter for gynecological examination (general) (routine) without abnormal findings: Secondary | ICD-10-CM | POA: Diagnosis not present

## 2017-08-18 DIAGNOSIS — Z713 Dietary counseling and surveillance: Secondary | ICD-10-CM | POA: Diagnosis not present

## 2017-08-18 DIAGNOSIS — Z131 Encounter for screening for diabetes mellitus: Secondary | ICD-10-CM | POA: Diagnosis not present

## 2017-09-08 ENCOUNTER — Ambulatory Visit (INDEPENDENT_AMBULATORY_CARE_PROVIDER_SITE_OTHER): Payer: 59 | Admitting: Family Medicine

## 2017-09-08 ENCOUNTER — Encounter: Payer: Self-pay | Admitting: Family Medicine

## 2017-09-08 ENCOUNTER — Other Ambulatory Visit: Payer: Self-pay

## 2017-09-08 VITALS — BP 118/78 | HR 64 | Temp 98.1°F | Resp 14 | Ht 68.0 in | Wt 203.0 lb

## 2017-09-08 DIAGNOSIS — I10 Essential (primary) hypertension: Secondary | ICD-10-CM | POA: Diagnosis not present

## 2017-09-08 DIAGNOSIS — Z23 Encounter for immunization: Secondary | ICD-10-CM | POA: Diagnosis not present

## 2017-09-08 DIAGNOSIS — Z0000 Encounter for general adult medical examination without abnormal findings: Secondary | ICD-10-CM

## 2017-09-08 LAB — COMPLETE METABOLIC PANEL WITH GFR
AG Ratio: 1.9 (calc) (ref 1.0–2.5)
ALKALINE PHOSPHATASE (APISO): 64 U/L (ref 33–130)
ALT: 18 U/L (ref 6–29)
AST: 16 U/L (ref 10–35)
Albumin: 4.5 g/dL (ref 3.6–5.1)
BILIRUBIN TOTAL: 1 mg/dL (ref 0.2–1.2)
BUN: 17 mg/dL (ref 7–25)
CO2: 29 mmol/L (ref 20–32)
CREATININE: 0.82 mg/dL (ref 0.50–1.05)
Calcium: 9.6 mg/dL (ref 8.6–10.4)
Chloride: 104 mmol/L (ref 98–110)
GFR, Est African American: 91 mL/min/{1.73_m2} (ref >=60)
GFR, Est Non African American: 78 mL/min/{1.73_m2} (ref >=60)
Globulin: 2.4 g/dL (calc) (ref 1.9–3.7)
Glucose, Bld: 106 mg/dL — ABNORMAL HIGH (ref 65–99)
Potassium: 5 mmol/L (ref 3.5–5.3)
SODIUM: 140 mmol/L (ref 135–146)
TOTAL PROTEIN: 6.9 g/dL (ref 6.1–8.1)

## 2017-09-08 LAB — CBC WITH DIFFERENTIAL/PLATELET
BASOS ABS: 49 {cells}/uL (ref 0–200)
BASOS PCT: 1 %
EOS ABS: 201 {cells}/uL (ref 15–500)
Eosinophils Relative: 4.1 %
HEMATOCRIT: 37.4 % (ref 35.0–45.0)
HEMOGLOBIN: 12.7 g/dL (ref 11.7–15.5)
LYMPHS ABS: 1828 {cells}/uL (ref 850–3900)
MCH: 30.5 pg (ref 27.0–33.0)
MCHC: 34 g/dL (ref 32.0–36.0)
MCV: 89.7 fL (ref 80.0–100.0)
MPV: 12 fL (ref 7.5–12.5)
Monocytes Relative: 7.1 %
NEUTROS ABS: 2475 {cells}/uL (ref 1500–7800)
Neutrophils Relative %: 50.5 %
Platelets: 240 10*3/uL (ref 140–400)
RBC: 4.17 10*6/uL (ref 3.80–5.10)
RDW: 12 % (ref 11.0–15.0)
Total Lymphocyte: 37.3 %
WBC: 4.9 10*3/uL (ref 3.8–10.8)
WBCMIX: 348 {cells}/uL (ref 200–950)

## 2017-09-08 LAB — LIPID PANEL
CHOLESTEROL: 254 mg/dL — AB (ref <200)
HDL: 75 mg/dL (ref >50)
LDL Cholesterol (Calc): 159 mg/dL (calc) — ABNORMAL HIGH
Non-HDL Cholesterol (Calc): 179 mg/dL (calc) — ABNORMAL HIGH (ref <130)
Total CHOL/HDL Ratio: 3.4 (calc) (ref <5.0)
Triglycerides: 91 mg/dL (ref <150)

## 2017-09-08 NOTE — Progress Notes (Signed)
Subjective:    Patient ID: Nancy Steele, female    DOB: [birth year], 61 y.o.   MRN: [id number]  HPI Patient is here today for a complete physical exam.  She is due for a tetanus shot.  Her flu shot is up-to-date.  We discussed the shingles vaccine and she will check on the price first.  She is due for hepatitis C screening along with HIV screening however the patient declines screening as she denies any risk factors for the above-mentioned viruses.  Mammogram, Pap smear, pelvic exam, are performed at her gynecologist and have been performed this year.  Colonoscopy is up-to-date (06/2016) Immunization History  Administered Date(s) Administered  . Influenza-Unspecified 07/28/2016, 08/11/2017  . Tdap 09/08/2017    Past Medical History:  Diagnosis Date  . Acute medial meniscus tear of left knee   . Allergy    seasonal  . Arthritis   . Blood transfusion without reported diagnosis    28 yrs ago  . Cataract    left eye-removal with implant   . DDD (degenerative disc disease), cervical   . Glaucoma    glucoma like issue- cuased from past surgery increasing pressurein the eye   . Hypertension    Past Surgical History:  Procedure Laterality Date  . CARPAL TUNNEL RELEASE     bilateral  . CERVICAL FUSION  2000  . CHONDROPLASTY Left 10/17/2014   Procedure: CHONDROPLASTY;  Surgeon: Lorn Junes, MD;  Location: [facility];  Service: Orthopedics;  Laterality: Left;  . COLONOSCOPY    . KNEE ARTHROSCOPY     bilateral  . KNEE ARTHROSCOPY WITH LATERAL MENISECTOMY Left 10/17/2014   Procedure: KNEE ARTHROSCOPY WITH LATERAL MENISECTOMY;  Surgeon: Lorn Junes, MD;  Location: [facility];  Service: Orthopedics;  Laterality: Left;  . KNEE ARTHROSCOPY WITH MEDIAL MENISECTOMY Left 10/17/2014   Procedure: LEFT KNEE ARTHROSCOPY WITH MEDIAL MENISCECTOMY;  Surgeon: Lorn Junes, MD;  Location: [facility];  Service: Orthopedics;  Laterality: Left;  .  POLYPECTOMY    . UPPER GI ENDOSCOPY     pt states she has never had an EGD in the past    Current Outpatient Medications on File Prior to Visit  Medication Sig Dispense Refill  . acetaminophen (TYLENOL) 500 MG tablet Take 500 mg by mouth every 6 (six) hours as needed.    Marland Kitchen aspirin 81 MG tablet Take 81 mg by mouth daily.    . Calcium Carb-Cholecalciferol (CALTRATE 600+D) 600-800 MG-UNIT TABS Take by mouth.    . COMBIGAN 0.2-0.5 % ophthalmic solution INSTILL 1 DROP INTO LEFT EYE TWICE A DAY  3  . lisinopril (PRINIVIL,ZESTRIL) 10 MG tablet Take 1 tablet (10 mg total) by mouth daily. 90 tablet 3  . naproxen sodium (ANAPROX) 220 MG tablet Take 220 mg by mouth 2 (two) times daily with a meal.    . RESTASIS MULTIDOSE 0.05 % ophthalmic emulsion      Current Facility-Administered Medications on File Prior to Visit  Medication Dose Route Frequency Provider Last Rate Last Dose  . 0.9 %  sodium chloride infusion  500 mL Intravenous Continuous Irene Shipper, MD       Allergies  Allergen Reactions  . Penicillins Swelling and Rash  . Vancomycin Hives  . Sulfa Antibiotics Nausea And Vomiting  . Sulfasalazine Nausea And Vomiting   Social History   Socioeconomic History  . Marital status: Married    Spouse name: Not on file  . Number  of children: Not on file  . Years of education: Not on file  . Highest education level: Not on file  Social Needs  . Financial resource strain: Not on file  . Food insecurity - worry: Not on file  . Food insecurity - inability: Not on file  . Transportation needs - medical: Not on file  . Transportation needs - non-medical: Not on file  Occupational History  . Not on file  Tobacco Use  . Smoking status: Never Smoker  . Smokeless tobacco: Never Used  Substance and Sexual Activity  . Alcohol use: Yes    Comment: occasional  . Drug use: No  . Sexual activity: Not on file  Other Topics Concern  . Not on file  Social History Narrative  . Not on file       Review of Systems  All other systems reviewed and are negative.      Objective:   Physical Exam  Constitutional: She is oriented to person, place, and time. She appears well-developed and well-nourished. No distress.  HENT:  Head: Normocephalic and atraumatic.  Right Ear: External ear normal.  Left Ear: External ear normal.  Nose: Nose normal.  Mouth/Throat: Oropharynx is clear and moist. No oropharyngeal exudate.  Eyes: Conjunctivae and EOM are normal. Pupils are equal, round, and reactive to light. Right eye exhibits no discharge. Left eye exhibits no discharge. No scleral icterus.  Neck: Normal range of motion. Neck supple. No JVD present. No tracheal deviation present. No thyromegaly present.  Cardiovascular: Normal rate, regular rhythm, normal heart sounds and intact distal pulses.  No murmur heard. Pulmonary/Chest: Effort normal and breath sounds normal. No stridor. No respiratory distress. She has no wheezes. She has no rales. She exhibits no tenderness.  Abdominal: Soft. Bowel sounds are normal. She exhibits no distension and no mass. There is no tenderness. There is no rebound and no guarding.  Musculoskeletal: She exhibits no edema.  Lymphadenopathy:    She has no cervical adenopathy.  Neurological: She is alert and oriented to person, place, and time. She has normal reflexes. She displays normal reflexes. No cranial nerve deficit. She exhibits normal muscle tone. Coordination normal.  Skin: Skin is warm. No rash noted. She is not diaphoretic. No erythema. No pallor.  Psychiatric: She has a normal mood and affect. Her behavior is normal. Judgment and thought content normal.  Vitals reviewed.         Assessment & Plan:  Benign essential HTN - Plan: CBC with Differential/Platelet, COMPLETE METABOLIC PANEL WITH GFR, Lipid panel  General medical exam  Blood pressure is outstanding. I will make no changes in her blood pressure medication at this time. Continue  lisinopril.  I will check a CBC, CMP, fasting lipid panel. Goal LDL cholesterol is less than 130.  Immunizations are up-to-date and cancer screening is up-to-date.  Patient can return for the shingles vaccine if she elects to in the future after she checks on the price.  She received her tetanus shot today.  She declines HIV screening and hepatitis C screening.  Mammogram and Pap smear were performed this year and are normal.  Colonoscopy is due again in 2027.

## 2017-09-15 ENCOUNTER — Other Ambulatory Visit: Payer: Self-pay | Admitting: Family Medicine

## 2017-09-15 ENCOUNTER — Encounter: Payer: Self-pay | Admitting: Family Medicine

## 2017-09-15 MED ORDER — ATORVASTATIN CALCIUM 20 MG PO TABS: 20.0000 mg | ORAL_TABLET | Freq: Every day | ORAL | 1 refills | Status: DC

## 2017-09-17 DIAGNOSIS — Z713 Dietary counseling and surveillance: Secondary | ICD-10-CM | POA: Diagnosis not present

## 2017-09-23 DIAGNOSIS — H04123 Dry eye syndrome of bilateral lacrimal glands: Secondary | ICD-10-CM | POA: Diagnosis not present

## 2017-09-23 DIAGNOSIS — H40052 Ocular hypertension, left eye: Secondary | ICD-10-CM | POA: Diagnosis not present

## 2017-09-23 DIAGNOSIS — H524 Presbyopia: Secondary | ICD-10-CM | POA: Diagnosis not present

## 2017-09-23 DIAGNOSIS — H2511 Age-related nuclear cataract, right eye: Secondary | ICD-10-CM | POA: Diagnosis not present

## 2017-10-05 DIAGNOSIS — M170 Bilateral primary osteoarthritis of knee: Secondary | ICD-10-CM | POA: Diagnosis not present

## 2017-10-18 DIAGNOSIS — Z713 Dietary counseling and surveillance: Secondary | ICD-10-CM | POA: Diagnosis not present

## 2017-10-20 ENCOUNTER — Encounter: Payer: Self-pay | Admitting: Family Medicine

## 2017-11-08 ENCOUNTER — Other Ambulatory Visit: Payer: Self-pay | Admitting: Obstetrics

## 2017-11-08 DIAGNOSIS — Z1231 Encounter for screening mammogram for malignant neoplasm of breast: Secondary | ICD-10-CM

## 2017-11-18 ENCOUNTER — Other Ambulatory Visit: Payer: Self-pay | Admitting: Family Medicine

## 2017-11-24 ENCOUNTER — Encounter (HOSPITAL_COMMUNITY): Payer: Self-pay | Admitting: Physician Assistant

## 2017-11-24 ENCOUNTER — Other Ambulatory Visit (HOSPITAL_COMMUNITY): Payer: 59

## 2017-11-24 DIAGNOSIS — M1712 Unilateral primary osteoarthritis, left knee: Secondary | ICD-10-CM | POA: Diagnosis present

## 2017-11-24 DIAGNOSIS — M19042 Primary osteoarthritis, left hand: Secondary | ICD-10-CM | POA: Diagnosis not present

## 2017-11-24 NOTE — H&P (Signed)
TOTAL KNEE ADMISSION H&P  Patient is being admitted for left total knee arthroplasty.  Subjective:  Chief Complaint:left knee pain.  HPI: Nancy Steele, 62 y.o. female, has a history of pain and functional disability in the left knee due to arthritis and has failed non-surgical conservative treatments for greater than 12 weeks to includeNSAID's and/or analgesics, corticosteriod injections, viscosupplementation injections, flexibility and strengthening excercises, supervised PT with diminished ADL's post treatment, use of assistive devices, weight reduction as appropriate and activity modification.  Onset of symptoms was gradual, starting 10 years ago with gradually worsening course since that time. The patient noted prior procedures on the knee to include  arthroscopy and menisectomy on the left knee(s).  Patient currently rates pain in the left knee(s) at 10 out of 10 with activity. Patient has night pain, worsening of pain with activity and weight bearing, pain that interferes with activities of daily living, crepitus and joint swelling.  Patient has evidence of subchondral sclerosis, periarticular osteophytes and joint space narrowing by imaging studies.  There is no active infection.  Patient Active Problem List   Diagnosis Date Noted  . Primary localized osteoarthritis of left knee 11/24/2017  . Acute medial meniscus tear of left knee   . Hypertension    Past Medical History:  Diagnosis Date  . Acute medial meniscus tear of left knee   . Allergy    seasonal  . Arthritis   . Blood transfusion without reported diagnosis    28 yrs ago  . Cataract    left eye-removal with implant   . DDD (degenerative disc disease), cervical   . Glaucoma    glucoma like issue- cuased from past surgery increasing pressurein the eye   . Hypertension   . Primary localized osteoarthritis of left knee 11/24/2017    Past Surgical History:  Procedure Laterality Date  . CARPAL TUNNEL RELEASE     bilateral  . CERVICAL FUSION  2000  . CHONDROPLASTY Left 10/17/2014   Procedure: CHONDROPLASTY;  Surgeon: Lorn Junes, MD;  Location: [facility];  Service: Orthopedics;  Laterality: Left;  . COLONOSCOPY    . KNEE ARTHROSCOPY     bilateral  . KNEE ARTHROSCOPY WITH LATERAL MENISECTOMY Left 10/17/2014   Procedure: KNEE ARTHROSCOPY WITH LATERAL MENISECTOMY;  Surgeon: Lorn Junes, MD;  Location: [facility];  Service: Orthopedics;  Laterality: Left;  . KNEE ARTHROSCOPY WITH MEDIAL MENISECTOMY Left 10/17/2014   Procedure: LEFT KNEE ARTHROSCOPY WITH MEDIAL MENISCECTOMY;  Surgeon: Lorn Junes, MD;  Location: [facility];  Service: Orthopedics;  Laterality: Left;  . POLYPECTOMY    . UPPER GI ENDOSCOPY     pt states she has never had an EGD in the past     Current Facility-Administered Medications  Medication Dose Route Frequency Provider Last Rate Last Dose  . 0.9 %  sodium chloride infusion  500 mL Intravenous Continuous Irene Shipper, MD       Current Outpatient Medications  Medication Sig Dispense Refill Last Dose  . acetaminophen (TYLENOL) 500 MG tablet Take 500-1,000 mg by mouth every 6 (six) hours as needed (for pain.).   Past Week at Unknown time  . atorvastatin (LIPITOR) 20 MG tablet Take 1 tablet (20 mg total) by mouth daily. 90 tablet 1 11/23/2017 at Unknown time  . Calcium Carb-Cholecalciferol (CALTRATE 600+D) 600-800 MG-UNIT TABS Take 1 tablet by mouth daily.    Past Month at Unknown time  . COMBIGAN 0.2-0.5 % ophthalmic solution  INSTILL 1 DROP INTO LEFT EYE TWICE A DAY  3 11/24/2017 at Unknown time  . Liraglutide -Weight Management (SAXENDA) 18 MG/3ML SOPN Inject 3 mg into the skin daily.   11/24/2017 at Unknown time  . lisinopril (PRINIVIL,ZESTRIL) 10 MG tablet TAKE 1 TABLET BY MOUTH EVERY DAY (Patient taking differently: TAKE 1 TABLET BY MOUTH EVERY DAY AT NIGHT) 90 tablet 3 11/23/2017 at Unknown time  . Multiple Vitamin (MULTIVITAMIN  WITH MINERALS) TABS tablet Take 1 tablet by mouth daily.   11/24/2017 at Unknown time  . naproxen sodium (ANAPROX) 220 MG tablet Take 220 mg by mouth 2 (two) times daily with a meal.   Past Month at Unknown time  . Polyethyl Glycol-Propyl Glycol (SYSTANE ULTRA) 0.4-0.3 % SOLN Place 1-2 drops into both eyes 3 (three) times daily as needed (for dry/irritated eyes.).   11/24/2017 at Unknown time   Allergies  Allergen Reactions  . Penicillins Swelling and Rash    Has patient had a PCN reaction causing immediate rash, facial/tongue/throat swelling, SOB or lightheadedness with hypotension: Unknown Has patient had a PCN reaction causing severe rash involving mucus membranes or skin necrosis: Unknown Has patient had a PCN reaction that required hospitalization: No Has patient had a PCN reaction occurring within the last 10 years: No Childhood reaction (unknown) If all of the above answers are "NO", then may proceed with Cephalosporin use.   . Vancomycin Hives  . Sulfa Antibiotics Nausea And Vomiting  . Sulfasalazine Nausea And Vomiting    Social History   Tobacco Use  . Smoking status: Never Smoker  . Smokeless tobacco: Never Used  Substance Use Topics  . Alcohol use: Yes    Comment: occasional    Family History  Problem Relation Age of Onset  . Diabetes Unknown   . Hypertension Unknown   . Arthritis Unknown   . Kidney cancer Father   . Breast cancer Maternal Aunt   . Colon cancer Neg Hx   . Colon polyps Neg Hx   . Esophageal cancer Neg Hx   . Rectal cancer Neg Hx   . Stomach cancer Neg Hx      Review of Systems  Constitutional: Positive for weight loss. Negative for chills, diaphoresis, fever and malaise/fatigue.  HENT: Negative.   Eyes: Negative.   Respiratory: Negative.   Cardiovascular: Negative.   Gastrointestinal: Negative.   Genitourinary: Negative.   Musculoskeletal: Positive for back pain and joint pain.  Skin: Negative.   Neurological: Negative.  Negative for  weakness.  Endo/Heme/Allergies: Negative.   Psychiatric/Behavioral: Negative.     Objective:  Physical Exam  Constitutional: She appears well-developed and well-nourished.  HENT:  Head: Normocephalic and atraumatic.  Mouth/Throat: Oropharynx is clear and moist.  Eyes: Conjunctivae are normal. Pupils are equal, round, and reactive to light.  Neck: Normal range of motion. Neck supple.  Cardiovascular: Normal rate.  Respiratory: Effort normal.  GI: Soft.  Genitourinary:  Genitourinary Comments: Not pertinent to current symptomatology therefore not examined.  Musculoskeletal:  Examination of both knees reveal pain bilaterally.  1+ crepitation.  1+ synovitis.  Range of motion 0-120 degrees.  Knees are stable with normal patella tracking.  Vascular exam: Pulses are 2+ and symmetric.      Vital signs in last 24 hours: Temp:  [97.9 F (36.6 C)] 97.9 F (36.6 C) (02/06 1400) Pulse Rate:  [64] 64 (02/06 1400) BP: (109)/(71) 109/71 (02/06 1400) SpO2:  [100 %] 100 % (02/06 1400) Weight:  [85.3 kg (188 lb)] 85.3 kg (  188 lb) (02/06 1400)  Labs:   Estimated body mass index is 30.34 kg/m as calculated from the following:   Height as of this encounter: 5\' 6"  (1.676 m).   Weight as of this encounter: 85.3 kg (188 lb).   Imaging Review Plain radiographs demonstrate severe degenerative joint disease of the left knee(s). The overall alignment issignificant varus. The bone quality appears to be good for age and reported activity level.  Assessment/Plan:  End stage arthritis, left knee  Principal Problem:   Primary localized osteoarthritis of left knee Active Problems:   Hypertension   The patient history, physical examination, clinical judgment of the provider and imaging studies are consistent with end stage degenerative joint disease of the left knee(s) and total knee arthroplasty is deemed medically necessary. The treatment options including medical management, injection therapy  arthroscopy and arthroplasty were discussed at length. The risks and benefits of total knee arthroplasty were presented and reviewed. The risks due to aseptic loosening, infection, stiffness, patella tracking problems, thromboembolic complications and other imponderables were discussed. The patient acknowledged the explanation, agreed to proceed with the plan and consent was signed. Patient is being admitted for inpatient treatment for surgery, pain control, PT, OT, prophylactic antibiotics, VTE prophylaxis, progressive ambulation and ADL's and discharge planning. The patient is planning to be discharged home with home health services

## 2017-11-25 ENCOUNTER — Encounter (HOSPITAL_COMMUNITY): Payer: Self-pay

## 2017-11-25 NOTE — Pre-Procedure Instructions (Signed)
Nancy Steele  11/25/2017      [id number] - [place], [place] - [address] [place] [address] Phone: [phone] Fax: [phone]    Your procedure is scheduled on Monday January 18.  Report to [facility] Admitting at 5:30 A.M.  Call this number if you have problems the morning of surgery:  [phone]   Remember:  Do not eat food or drink liquids after midnight.  Take these medicines the morning of surgery with A SIP OF WATER: atorvastatin (lipitor), acetaminophen (tylenol) if needed, eye drops   DO NOT TAKE Liraglutide (Saxenda) the morning of surgery  7 days prior to surgery STOP taking any Aspirin(unless otherwise instructed by your surgeon), Aleve, Naproxen, Ibuprofen, Motrin, Advil, Goody's, BC's, all herbal medications, fish oil, and all vitamins    Do not wear jewelry, make-up or nail polish.  Do not wear lotions, powders, or perfumes, or deodorant.  Do not shave 48 hours prior to surgery.  Men may shave face and neck.  Do not bring valuables to the hospital.  [facility] is not responsible for any belongings or valuables.  Contacts, dentures or bridgework may not be worn into surgery.  Leave your suitcase in the car.  After surgery it may be brought to your room.  For patients admitted to the hospital, discharge time will be determined by your treatment team.  Patients discharged the day of surgery will not be allowed to drive home.   Special instructions:    [place]- Preparing For Surgery  Before surgery, you can play an important role. Because skin is not sterile, your skin needs to be as free of germs as possible. You can reduce the number of germs on your skin by washing with CHG (chlorahexidine gluconate) Soap before surgery.  CHG is an antiseptic cleaner which kills germs and bonds with the skin to continue killing germs even after washing.  Please do not use if you have an allergy to CHG  or antibacterial soaps. If your skin becomes reddened/irritated stop using the CHG.  Do not shave (including legs and underarms) for at least 48 hours prior to first CHG shower. It is OK to shave your face.  Please follow these instructions carefully.   1. Shower the NIGHT BEFORE SURGERY and the MORNING OF SURGERY with CHG.   2. If you chose to wash your hair, wash your hair first as usual with your normal shampoo.  3. After you shampoo, rinse your hair and body thoroughly to remove the shampoo.  4. Use CHG as you would any other liquid soap. You can apply CHG directly to the skin and wash gently with a scrungie or a clean washcloth.   5. Apply the CHG Soap to your body ONLY FROM THE NECK DOWN.  Do not use on open wounds or open sores. Avoid contact with your eyes, ears, mouth and genitals (private parts). Wash Face and genitals (private parts)  with your normal soap.  6. Wash thoroughly, paying special attention to the area where your surgery will be performed.  7. Thoroughly rinse your body with warm water from the neck down.  8. DO NOT shower/wash with your normal soap after using and rinsing off the CHG Soap.  9. Pat yourself dry with a CLEAN TOWEL.  10. Wear CLEAN PAJAMAS to bed the night before surgery, wear comfortable clothes the morning of surgery  11. Place CLEAN SHEETS on your bed  the night of your first shower and DO NOT SLEEP WITH PETS.    Day of Surgery: Do not apply any deodorants/lotions. Please wear clean clothes to the hospital/surgery center.      Please read over the following fact sheets that you were given. Coughing and Deep Breathing, Total Joint Packet, MRSA Information and Surgical Site Infection Prevention

## 2017-11-26 ENCOUNTER — Encounter (HOSPITAL_COMMUNITY): Payer: Self-pay

## 2017-11-26 ENCOUNTER — Encounter (HOSPITAL_COMMUNITY)
Admission: RE | Admit: 2017-11-26 | Discharge: 2017-11-26 | Disposition: A | Discharge status: Home / Self Care | Payer: 59 | Source: Ambulatory Visit | Attending: Orthopedic Surgery | Admitting: Orthopedic Surgery

## 2017-11-26 ENCOUNTER — Other Ambulatory Visit: Payer: Self-pay

## 2017-11-26 DIAGNOSIS — Z01810 Encounter for preprocedural cardiovascular examination: Secondary | ICD-10-CM | POA: Insufficient documentation

## 2017-11-26 DIAGNOSIS — I10 Essential (primary) hypertension: Secondary | ICD-10-CM | POA: Diagnosis not present

## 2017-11-26 DIAGNOSIS — I493 Ventricular premature depolarization: Secondary | ICD-10-CM | POA: Insufficient documentation

## 2017-11-26 DIAGNOSIS — I499 Cardiac arrhythmia, unspecified: Secondary | ICD-10-CM | POA: Diagnosis not present

## 2017-11-26 DIAGNOSIS — M1712 Unilateral primary osteoarthritis, left knee: Secondary | ICD-10-CM | POA: Diagnosis not present

## 2017-11-26 HISTORY — DX: Family history of other specified conditions: Z84.89

## 2017-11-26 LAB — CBC WITH DIFFERENTIAL/PLATELET
Basophils Absolute: 0.1 10*3/uL (ref 0.0–0.1)
Basophils Relative: 1 %
EOS PCT: 2 %
Eosinophils Absolute: 0.1 10*3/uL (ref 0.0–0.7)
HCT: 36.5 % (ref 36.0–46.0)
Hemoglobin: 12.3 g/dL (ref 12.0–15.0)
LYMPHS ABS: 2.1 10*3/uL (ref 0.7–4.0)
LYMPHS PCT: 38 %
MCH: 30.8 pg (ref 26.0–34.0)
MCHC: 33.7 g/dL (ref 30.0–36.0)
MCV: 91.3 fL (ref 78.0–100.0)
MONO ABS: 0.3 10*3/uL (ref 0.1–1.0)
Monocytes Relative: 5 %
Neutro Abs: 2.9 10*3/uL (ref 1.7–7.7)
Neutrophils Relative %: 54 %
PLATELETS: 217 10*3/uL (ref 150–400)
RBC: 4 MIL/uL (ref 3.87–5.11)
RDW: 13 % (ref 11.5–15.5)
WBC: 5.5 10*3/uL (ref 4.0–10.5)

## 2017-11-26 LAB — COMPREHENSIVE METABOLIC PANEL
ALT: 19 U/L (ref 14–54)
ANION GAP: 12 (ref 5–15)
AST: 22 U/L (ref 15–41)
Albumin: 4.4 g/dL (ref 3.5–5.0)
Alkaline Phosphatase: 60 U/L (ref 38–126)
BUN: 17 mg/dL (ref 6–20)
CALCIUM: 9.3 mg/dL (ref 8.9–10.3)
CHLORIDE: 103 mmol/L (ref 101–111)
CO2: 24 mmol/L (ref 22–32)
CREATININE: 0.82 mg/dL (ref 0.44–1.00)
GFR calc Af Amer: >60 mL/min (ref >60)
Glucose, Bld: 89 mg/dL (ref 65–99)
Potassium: 3.9 mmol/L (ref 3.5–5.1)
Sodium: 139 mmol/L (ref 135–145)
Total Bilirubin: 1.2 mg/dL (ref 0.3–1.2)
Total Protein: 7 g/dL (ref 6.5–8.1)

## 2017-11-26 LAB — TYPE AND SCREEN
ABO/RH(D): A NEG
Antibody Screen: NEGATIVE

## 2017-11-26 LAB — APTT: aPTT: 28 seconds (ref 24–36)

## 2017-11-26 LAB — PROTIME-INR
INR: 1.04
PROTHROMBIN TIME: 13.5 s (ref 11.4–15.2)

## 2017-11-26 LAB — SURGICAL PCR SCREEN
MRSA, PCR: NEGATIVE
Staphylococcus aureus: NEGATIVE

## 2017-11-26 LAB — ABO/RH: ABO/RH(D): A NEG

## 2017-11-26 NOTE — Progress Notes (Signed)
PCP - Nancy Steele Cardiologist - denies cardiologist or cardiac workup  EKG - 11/26/2017   Pt takes [place] for weight loss. States she stopped taking this 2 weeks prior to surgery. Pt was taken off lisinopril this week d/t her BP being low. Pt states her PCP told her that he BP would decreased with surgery so not to take this anymore prior to surgery.   Patient denies shortness of breath, fever, cough and chest pain at PAT appointment   Patient verbalized understanding of instructions that were given to them at the PAT appointment. Patient was also instructed that they will need to review over the PAT instructions again at home before surgery.

## 2017-11-27 LAB — URINE CULTURE: Culture: NO GROWTH

## 2017-12-03 ENCOUNTER — Encounter (HOSPITAL_COMMUNITY): Payer: Self-pay | Admitting: General Practice

## 2017-12-03 MED ORDER — BUPIVACAINE LIPOSOME 1.3 % IJ SUSP
20.0000 mL | INTRAMUSCULAR | Status: AC
  Administered 2017-12-06: 20 mL
  Filled 2017-12-03 (×2): qty 20

## 2017-12-03 MED ORDER — TRANEXAMIC ACID 1000 MG/10ML IV SOLN
1000.0000 mg | INTRAVENOUS | Status: AC
  Administered 2017-12-06: 1000 mg via INTRAVENOUS
  Filled 2017-12-03: qty 1.1

## 2017-12-03 NOTE — Progress Notes (Signed)
Notified Luna Glasgow, PA of patient's allergies.  Kirsten confirmed that patient can tolerate Ancef.

## 2017-12-05 NOTE — Anesthesia Preprocedure Evaluation (Addendum)
Anesthesia Evaluation  Patient identified by MRN, date of birth, ID band Patient awake    Reviewed: Allergy & Precautions, NPO status , Patient's Chart, lab work & pertinent test results  Airway Mallampati: III  TM Distance: >3 FB Neck ROM: Full    Dental no notable dental hx.    Pulmonary neg pulmonary ROS,    Pulmonary exam normal breath sounds clear to auscultation       Cardiovascular hypertension, Pt. on medications Normal cardiovascular exam Rhythm:Regular Rate:Normal  ECG: SR, occ PVC's. Rate 74   Neuro/Psych negative neurological ROS  negative psych ROS   GI/Hepatic negative GI ROS, Neg liver ROS,   Endo/Other  negative endocrine ROS  Renal/GU negative Renal ROS     Musculoskeletal  (+) Arthritis , Osteoarthritis,    Abdominal   Peds  Hematology HLD   Anesthesia Other Findings DJD LEFT KNEE  Reproductive/Obstetrics                           Anesthesia Physical  Anesthesia Plan  ASA: II  Anesthesia Plan: Regional and Spinal   Post-op Pain Management:    Induction: Intravenous  PONV Risk Score and Plan: 2 and Ondansetron, Dexamethasone, Treatment may vary due to age or medical condition and Midazolam  Airway Management Planned: Natural Airway  Additional Equipment:   Intra-op Plan:   Post-operative Plan:   Informed Consent: I have reviewed the patients History and Physical, chart, labs and discussed the procedure including the risks, benefits and alternatives for the proposed anesthesia with the patient or authorized representative who has indicated his/her understanding and acceptance.   Dental advisory given  Plan Discussed with: CRNA  Anesthesia Plan Comments:         Anesthesia Quick Evaluation

## 2017-12-06 ENCOUNTER — Ambulatory Visit (HOSPITAL_COMMUNITY): Payer: 59 | Admitting: Emergency Medicine

## 2017-12-06 ENCOUNTER — Ambulatory Visit (HOSPITAL_COMMUNITY): Payer: 59 | Admitting: Anesthesiology

## 2017-12-06 ENCOUNTER — Encounter (HOSPITAL_COMMUNITY): Payer: Self-pay | Admitting: *Deleted

## 2017-12-06 ENCOUNTER — Ambulatory Visit (HOSPITAL_COMMUNITY): Payer: 59

## 2017-12-06 ENCOUNTER — Observation Stay (HOSPITAL_COMMUNITY)
Admission: RE | Admit: 2017-12-06 | Discharge: 2017-12-07 | Disposition: A | Discharge status: Home / Self Care | Payer: 59 | Source: Ambulatory Visit | Attending: Orthopedic Surgery | Admitting: Orthopedic Surgery

## 2017-12-06 ENCOUNTER — Encounter (HOSPITAL_COMMUNITY)
Admission: RE | Disposition: A | Discharge status: Home / Self Care | Payer: Self-pay | Source: Ambulatory Visit | Attending: Orthopedic Surgery

## 2017-12-06 DIAGNOSIS — Z8261 Family history of arthritis: Secondary | ICD-10-CM | POA: Insufficient documentation

## 2017-12-06 DIAGNOSIS — Z79899 Other long term (current) drug therapy: Secondary | ICD-10-CM | POA: Insufficient documentation

## 2017-12-06 DIAGNOSIS — G8918 Other acute postprocedural pain: Secondary | ICD-10-CM | POA: Diagnosis not present

## 2017-12-06 DIAGNOSIS — Z880 Allergy status to penicillin: Secondary | ICD-10-CM | POA: Insufficient documentation

## 2017-12-06 DIAGNOSIS — Z881 Allergy status to other antibiotic agents status: Secondary | ICD-10-CM | POA: Insufficient documentation

## 2017-12-06 DIAGNOSIS — Z882 Allergy status to sulfonamides status: Secondary | ICD-10-CM | POA: Insufficient documentation

## 2017-12-06 DIAGNOSIS — Z9842 Cataract extraction status, left eye: Secondary | ICD-10-CM | POA: Insufficient documentation

## 2017-12-06 DIAGNOSIS — Z833 Family history of diabetes mellitus: Secondary | ICD-10-CM | POA: Diagnosis not present

## 2017-12-06 DIAGNOSIS — Z791 Long term (current) use of non-steroidal anti-inflammatories (NSAID): Secondary | ICD-10-CM | POA: Insufficient documentation

## 2017-12-06 DIAGNOSIS — H409 Unspecified glaucoma: Secondary | ICD-10-CM | POA: Diagnosis not present

## 2017-12-06 DIAGNOSIS — Z8249 Family history of ischemic heart disease and other diseases of the circulatory system: Secondary | ICD-10-CM | POA: Insufficient documentation

## 2017-12-06 DIAGNOSIS — Z803 Family history of malignant neoplasm of breast: Secondary | ICD-10-CM | POA: Insufficient documentation

## 2017-12-06 DIAGNOSIS — Z9889 Other specified postprocedural states: Secondary | ICD-10-CM | POA: Diagnosis not present

## 2017-12-06 DIAGNOSIS — E785 Hyperlipidemia, unspecified: Secondary | ICD-10-CM | POA: Diagnosis not present

## 2017-12-06 DIAGNOSIS — M21162 Varus deformity, not elsewhere classified, left knee: Secondary | ICD-10-CM | POA: Diagnosis not present

## 2017-12-06 DIAGNOSIS — R2689 Other abnormalities of gait and mobility: Secondary | ICD-10-CM | POA: Insufficient documentation

## 2017-12-06 DIAGNOSIS — M1712 Unilateral primary osteoarthritis, left knee: Principal | ICD-10-CM | POA: Insufficient documentation

## 2017-12-06 DIAGNOSIS — Z961 Presence of intraocular lens: Secondary | ICD-10-CM | POA: Diagnosis not present

## 2017-12-06 DIAGNOSIS — I10 Essential (primary) hypertension: Secondary | ICD-10-CM | POA: Diagnosis not present

## 2017-12-06 DIAGNOSIS — Z981 Arthrodesis status: Secondary | ICD-10-CM | POA: Diagnosis not present

## 2017-12-06 DIAGNOSIS — M1711 Unilateral primary osteoarthritis, right knee: Secondary | ICD-10-CM | POA: Diagnosis present

## 2017-12-06 DIAGNOSIS — Z8051 Family history of malignant neoplasm of kidney: Secondary | ICD-10-CM | POA: Diagnosis not present

## 2017-12-06 DIAGNOSIS — Z96652 Presence of left artificial knee joint: Secondary | ICD-10-CM | POA: Diagnosis not present

## 2017-12-06 DIAGNOSIS — Z471 Aftercare following joint replacement surgery: Secondary | ICD-10-CM | POA: Diagnosis not present

## 2017-12-06 DIAGNOSIS — S83242A Other tear of medial meniscus, current injury, left knee, initial encounter: Secondary | ICD-10-CM | POA: Diagnosis not present

## 2017-12-06 DIAGNOSIS — Z96659 Presence of unspecified artificial knee joint: Secondary | ICD-10-CM

## 2017-12-06 HISTORY — PX: TOTAL KNEE ARTHROPLASTY: SHX125

## 2017-12-06 SURGERY — ARTHROPLASTY, KNEE, TOTAL
Anesthesia: Regional | Site: Knee | Laterality: Left

## 2017-12-06 MED ORDER — TIMOLOL MALEATE 0.5 % OP SOLN
1.0000 [drp] | Freq: Two times a day (BID) | OPHTHALMIC | Status: DC
  Administered 2017-12-06 – 2017-12-07 (×2): 1 [drp] via OPHTHALMIC
  Filled 2017-12-06: qty 5

## 2017-12-06 MED ORDER — ONDANSETRON HCL 4 MG PO TABS
4.0000 mg | ORAL_TABLET | Freq: Four times a day (QID) | ORAL | Status: DC | PRN
  Administered 2017-12-06: 4 mg via ORAL
  Filled 2017-12-06: qty 1

## 2017-12-06 MED ORDER — PROPOFOL 500 MG/50ML IV EMUL
INTRAVENOUS | Status: DC | PRN
  Administered 2017-12-06: 50 ug/kg/min via INTRAVENOUS

## 2017-12-06 MED ORDER — LISINOPRIL 10 MG PO TABS
10.0000 mg | ORAL_TABLET | Freq: Every day | ORAL | Status: DC
  Filled 2017-12-06: qty 1

## 2017-12-06 MED ORDER — OXYCODONE HCL 5 MG PO TABS: 5.0000 mg | ORAL_TABLET | ORAL | 0 refills | Status: AC | PRN

## 2017-12-06 MED ORDER — SODIUM CHLORIDE 0.9 % IR SOLN
Status: DC | PRN
  Administered 2017-12-06: 3000 mL

## 2017-12-06 MED ORDER — MENTHOL 3 MG MT LOZG: 1.0000 | LOZENGE | OROMUCOSAL | Status: DC | PRN

## 2017-12-06 MED ORDER — DOCUSATE SODIUM 100 MG PO CAPS
100.0000 mg | ORAL_CAPSULE | Freq: Two times a day (BID) | ORAL | Status: DC
  Administered 2017-12-06 – 2017-12-07 (×3): 100 mg via ORAL
  Filled 2017-12-06 (×3): qty 1

## 2017-12-06 MED ORDER — ATORVASTATIN CALCIUM 20 MG PO TABS
20.0000 mg | ORAL_TABLET | Freq: Every day | ORAL | Status: DC
  Administered 2017-12-06 – 2017-12-07 (×2): 20 mg via ORAL
  Filled 2017-12-06 (×2): qty 1

## 2017-12-06 MED ORDER — FENTANYL CITRATE (PF) 100 MCG/2ML IJ SOLN
25.0000 ug | INTRAMUSCULAR | Status: DC | PRN
  Administered 2017-12-06: 50 ug via INTRAVENOUS

## 2017-12-06 MED ORDER — ACETAMINOPHEN 650 MG RE SUPP: 650.0000 mg | RECTAL | Status: DC | PRN

## 2017-12-06 MED ORDER — OXYCODONE HCL 5 MG PO TABS
5.0000 mg | ORAL_TABLET | ORAL | Status: DC | PRN
  Administered 2017-12-06 – 2017-12-07 (×3): 5 mg via ORAL
  Filled 2017-12-06 (×2): qty 1

## 2017-12-06 MED ORDER — SORBITOL 70 % SOLN: 30.0000 mL | Freq: Every day | Status: DC | PRN

## 2017-12-06 MED ORDER — SENNA 8.6 MG PO TABS
1.0000 | ORAL_TABLET | Freq: Two times a day (BID) | ORAL | Status: DC
  Administered 2017-12-06 – 2017-12-07 (×2): 8.6 mg via ORAL
  Filled 2017-12-06 (×2): qty 1

## 2017-12-06 MED ORDER — CEFAZOLIN SODIUM-DEXTROSE 1-4 GM/50ML-% IV SOLN
1.0000 g | Freq: Four times a day (QID) | INTRAVENOUS | Status: AC
  Administered 2017-12-06 (×2): 1 g via INTRAVENOUS
  Filled 2017-12-06 (×2): qty 50

## 2017-12-06 MED ORDER — CHLORHEXIDINE GLUCONATE 4 % EX LIQD: 60.0000 mL | Freq: Once | CUTANEOUS | Status: DC

## 2017-12-06 MED ORDER — METHOCARBAMOL 500 MG PO TABS
500.0000 mg | ORAL_TABLET | Freq: Four times a day (QID) | ORAL | Status: DC | PRN
  Administered 2017-12-06 – 2017-12-07 (×4): 500 mg via ORAL
  Filled 2017-12-06 (×3): qty 1

## 2017-12-06 MED ORDER — POLYETHYLENE GLYCOL 3350 17 G PO PACK: 17.0000 g | PACK | Freq: Every day | ORAL | Status: DC | PRN

## 2017-12-06 MED ORDER — MIDAZOLAM HCL 2 MG/2ML IJ SOLN
INTRAMUSCULAR | Status: DC | PRN
  Administered 2017-12-06: 2 mg via INTRAVENOUS

## 2017-12-06 MED ORDER — POVIDONE-IODINE 7.5 % EX SOLN: Freq: Once | CUTANEOUS | Status: DC

## 2017-12-06 MED ORDER — DEXAMETHASONE SODIUM PHOSPHATE 10 MG/ML IJ SOLN
10.0000 mg | Freq: Three times a day (TID) | INTRAMUSCULAR | Status: DC
  Administered 2017-12-06 – 2017-12-07 (×4): 10 mg via INTRAVENOUS
  Filled 2017-12-06 (×4): qty 1

## 2017-12-06 MED ORDER — ASPIRIN EC 325 MG PO TBEC
325.0000 mg | DELAYED_RELEASE_TABLET | Freq: Every day | ORAL | Status: DC
  Administered 2017-12-07: 325 mg via ORAL
  Filled 2017-12-06: qty 1

## 2017-12-06 MED ORDER — METOCLOPRAMIDE HCL 5 MG PO TABS: 5.0000 mg | ORAL_TABLET | Freq: Three times a day (TID) | ORAL | Status: DC | PRN

## 2017-12-06 MED ORDER — LACTATED RINGERS IV SOLN
INTRAVENOUS | Status: DC
  Administered 2017-12-06 (×2): via INTRAVENOUS

## 2017-12-06 MED ORDER — ONDANSETRON HCL 4 MG PO TABS: 4.0000 mg | ORAL_TABLET | Freq: Three times a day (TID) | ORAL | 0 refills | Status: DC | PRN

## 2017-12-06 MED ORDER — ROPIVACAINE HCL 7.5 MG/ML IJ SOLN
INTRAMUSCULAR | Status: DC | PRN
  Administered 2017-12-06: 20 mL via PERINEURAL

## 2017-12-06 MED ORDER — PROPOFOL 10 MG/ML IV BOLUS
INTRAVENOUS | Status: DC | PRN
  Administered 2017-12-06 (×3): 20 mg via INTRAVENOUS

## 2017-12-06 MED ORDER — SODIUM CHLORIDE 0.9 % IJ SOLN
INTRAMUSCULAR | Status: DC | PRN
  Administered 2017-12-06 (×5): 10 mL

## 2017-12-06 MED ORDER — METHOCARBAMOL 1000 MG/10ML IJ SOLN
500.0000 mg | Freq: Four times a day (QID) | INTRAVENOUS | Status: DC | PRN
  Filled 2017-12-06: qty 5

## 2017-12-06 MED ORDER — DEXAMETHASONE SODIUM PHOSPHATE 10 MG/ML IJ SOLN
INTRAMUSCULAR | Status: AC
  Filled 2017-12-06: qty 1

## 2017-12-06 MED ORDER — ACETAMINOPHEN 500 MG PO TABS
1000.0000 mg | ORAL_TABLET | Freq: Three times a day (TID) | ORAL | Status: DC
  Administered 2017-12-06 – 2017-12-07 (×4): 1000 mg via ORAL
  Filled 2017-12-06 (×4): qty 2

## 2017-12-06 MED ORDER — OXYCODONE HCL 5 MG PO TABS
10.0000 mg | ORAL_TABLET | ORAL | Status: DC | PRN
  Administered 2017-12-06: 10 mg via ORAL
  Filled 2017-12-06 (×2): qty 2

## 2017-12-06 MED ORDER — DOCUSATE SODIUM 100 MG PO CAPS: 100.0000 mg | ORAL_CAPSULE | Freq: Two times a day (BID) | ORAL | 0 refills | Status: DC

## 2017-12-06 MED ORDER — BRIMONIDINE TARTRATE 0.2 % OP SOLN
1.0000 [drp] | Freq: Two times a day (BID) | OPHTHALMIC | Status: DC
  Administered 2017-12-06 – 2017-12-07 (×2): 1 [drp] via OPHTHALMIC
  Filled 2017-12-06: qty 5

## 2017-12-06 MED ORDER — ACETAMINOPHEN 325 MG PO TABS
650.0000 mg | ORAL_TABLET | ORAL | Status: DC | PRN
  Administered 2017-12-06: 650 mg via ORAL
  Filled 2017-12-06: qty 2

## 2017-12-06 MED ORDER — OMEPRAZOLE 20 MG PO CPDR: 20.0000 mg | DELAYED_RELEASE_CAPSULE | Freq: Every day | ORAL | 0 refills | Status: DC

## 2017-12-06 MED ORDER — ONDANSETRON HCL 4 MG/2ML IJ SOLN
4.0000 mg | Freq: Four times a day (QID) | INTRAMUSCULAR | Status: DC | PRN
  Filled 2017-12-06: qty 2

## 2017-12-06 MED ORDER — METOCLOPRAMIDE HCL 5 MG/ML IJ SOLN: 5.0000 mg | Freq: Three times a day (TID) | INTRAMUSCULAR | Status: DC | PRN

## 2017-12-06 MED ORDER — BRIMONIDINE TARTRATE-TIMOLOL 0.2-0.5 % OP SOLN
1.0000 [drp] | Freq: Two times a day (BID) | OPHTHALMIC | Status: DC
  Filled 2017-12-06: qty 5

## 2017-12-06 MED ORDER — DIPHENHYDRAMINE HCL 12.5 MG/5ML PO ELIX: 12.5000 mg | ORAL_SOLUTION | ORAL | Status: DC | PRN

## 2017-12-06 MED ORDER — ASPIRIN EC 325 MG PO TBEC: 325.0000 mg | DELAYED_RELEASE_TABLET | Freq: Every day | ORAL | 0 refills | Status: DC

## 2017-12-06 MED ORDER — 0.9 % SODIUM CHLORIDE (POUR BTL) OPTIME
TOPICAL | Status: DC | PRN
  Administered 2017-12-06: 1000 mL

## 2017-12-06 MED ORDER — LACTATED RINGERS IV SOLN
INTRAVENOUS | Status: DC
  Administered 2017-12-06 (×2): via INTRAVENOUS

## 2017-12-06 MED ORDER — POLYVINYL ALCOHOL 1.4 % OP SOLN: 1.0000 [drp] | Freq: Three times a day (TID) | OPHTHALMIC | Status: DC | PRN

## 2017-12-06 MED ORDER — BUPIVACAINE-EPINEPHRINE 0.25% -1:200000 IJ SOLN
INTRAMUSCULAR | Status: DC | PRN
  Administered 2017-12-06: 50 mL

## 2017-12-06 MED ORDER — MIDAZOLAM HCL 2 MG/2ML IJ SOLN
INTRAMUSCULAR | Status: AC
  Filled 2017-12-06: qty 2

## 2017-12-06 MED ORDER — METHOCARBAMOL 500 MG PO TABS: 500.0000 mg | ORAL_TABLET | Freq: Four times a day (QID) | ORAL | 0 refills | Status: DC | PRN

## 2017-12-06 MED ORDER — FENTANYL CITRATE (PF) 100 MCG/2ML IJ SOLN
INTRAMUSCULAR | Status: AC
  Filled 2017-12-06: qty 2

## 2017-12-06 MED ORDER — ACETAMINOPHEN 500 MG PO TABS: 1000.0000 mg | ORAL_TABLET | Freq: Three times a day (TID) | ORAL | 0 refills | Status: AC

## 2017-12-06 MED ORDER — ESMOLOL HCL 100 MG/10ML IV SOLN
INTRAVENOUS | Status: AC
  Filled 2017-12-06: qty 10

## 2017-12-06 MED ORDER — PROPOFOL 10 MG/ML IV BOLUS
INTRAVENOUS | Status: AC
  Filled 2017-12-06: qty 20

## 2017-12-06 MED ORDER — POLYETHYL GLYCOL-PROPYL GLYCOL 0.4-0.3 % OP SOLN: 1.0000 [drp] | Freq: Three times a day (TID) | OPHTHALMIC | Status: DC | PRN

## 2017-12-06 MED ORDER — BUPIVACAINE-EPINEPHRINE 0.25% -1:200000 IJ SOLN
INTRAMUSCULAR | Status: AC
  Filled 2017-12-06: qty 1

## 2017-12-06 MED ORDER — ONDANSETRON HCL 4 MG/2ML IJ SOLN
INTRAMUSCULAR | Status: AC
  Filled 2017-12-06: qty 2

## 2017-12-06 MED ORDER — HYDROMORPHONE HCL 1 MG/ML IJ SOLN
0.5000 mg | INTRAMUSCULAR | Status: DC | PRN
  Administered 2017-12-06: 0.5 mg via INTRAVENOUS
  Filled 2017-12-06: qty 1

## 2017-12-06 MED ORDER — OXYCODONE HCL 5 MG PO TABS
ORAL_TABLET | ORAL | Status: AC
  Filled 2017-12-06: qty 1

## 2017-12-06 MED ORDER — BUPIVACAINE IN DEXTROSE 0.75-8.25 % IT SOLN
INTRATHECAL | Status: DC | PRN
  Administered 2017-12-06: 1.6 mL via INTRATHECAL

## 2017-12-06 MED ORDER — CEFAZOLIN SODIUM-DEXTROSE 2-4 GM/100ML-% IV SOLN
2.0000 g | Freq: Once | INTRAVENOUS | Status: AC
  Administered 2017-12-06: 2 g via INTRAVENOUS
  Filled 2017-12-06: qty 100

## 2017-12-06 MED ORDER — METHOCARBAMOL 500 MG PO TABS
ORAL_TABLET | ORAL | Status: AC
  Filled 2017-12-06: qty 1

## 2017-12-06 MED ORDER — DEXAMETHASONE SODIUM PHOSPHATE 10 MG/ML IJ SOLN
INTRAMUSCULAR | Status: DC | PRN
  Administered 2017-12-06: 10 mg via INTRAVENOUS

## 2017-12-06 MED ORDER — ONDANSETRON HCL 4 MG/2ML IJ SOLN: 4.0000 mg | Freq: Once | INTRAMUSCULAR | Status: DC | PRN

## 2017-12-06 MED ORDER — PHENOL 1.4 % MT LIQD
1.0000 | OROMUCOSAL | Status: DC | PRN
Start: 2017-12-06 — End: 2017-12-07

## 2017-12-06 MED ORDER — PHENYLEPHRINE HCL 10 MG/ML IJ SOLN
INTRAVENOUS | Status: DC | PRN
  Administered 2017-12-06: 30 ug/min via INTRAVENOUS

## 2017-12-06 MED ORDER — ONDANSETRON HCL 4 MG/2ML IJ SOLN
INTRAMUSCULAR | Status: DC | PRN
  Administered 2017-12-06: 4 mg via INTRAVENOUS

## 2017-12-06 SURGICAL SUPPLY — 74 items
APL SKNCLS STERI-STRIP NONHPOA (GAUZE/BANDAGES/DRESSINGS) ×1
BANDAGE ESMARK 6X9 LF (GAUZE/BANDAGES/DRESSINGS) ×1 IMPLANT
BENZOIN TINCTURE PRP APPL 2/3 (GAUZE/BANDAGES/DRESSINGS) ×3 IMPLANT
BLADE SAGITTAL 25.0X1.19X90 (BLADE) ×2 IMPLANT
BLADE SAGITTAL 25.0X1.19X90MM (BLADE) ×1
BLADE SAW SGTL 13X75X1.27 (BLADE) ×3 IMPLANT
BLADE SURG 10 STRL SS (BLADE) ×6 IMPLANT
BNDG CMPR 9X6 STRL LF SNTH (GAUZE/BANDAGES/DRESSINGS) ×1
BNDG CMPR MED 15X6 ELC VLCR LF (GAUZE/BANDAGES/DRESSINGS) ×1
BNDG ELASTIC 6X15 VLCR STRL LF (GAUZE/BANDAGES/DRESSINGS) ×3 IMPLANT
BNDG ESMARK 6X9 LF (GAUZE/BANDAGES/DRESSINGS) ×3
BOWL SMART MIX CTS (DISPOSABLE) ×3 IMPLANT
CAPT KNEE TOTAL 3 ATTUNE ×2 IMPLANT
CEMENT HV SMART SET (Cement) ×6 IMPLANT
CLOSURE WOUND 1/2 X4 (GAUZE/BANDAGES/DRESSINGS) ×1
COVER SURGICAL LIGHT HANDLE (MISCELLANEOUS) ×3 IMPLANT
CUFF TOURNIQUET SINGLE 34IN LL (TOURNIQUET CUFF) ×3 IMPLANT
CUFF TOURNIQUET SINGLE 44IN (TOURNIQUET CUFF) IMPLANT
DECANTER SPIKE VIAL GLASS SM (MISCELLANEOUS) ×6 IMPLANT
DRAPE EXTREMITY T 121X128X90 (DRAPE) ×3 IMPLANT
DRAPE HALF SHEET 40X57 (DRAPES) ×6 IMPLANT
DRAPE INCISE IOBAN 66X45 STRL (DRAPES) IMPLANT
DRAPE ORTHO SPLIT 77X108 STRL (DRAPES) ×3
DRAPE SURG ORHT 6 SPLT 77X108 (DRAPES) ×1 IMPLANT
DRAPE U-SHAPE 47X51 STRL (DRAPES) ×3 IMPLANT
DRSG AQUACEL AG ADV 3.5X10 (GAUZE/BANDAGES/DRESSINGS) ×3 IMPLANT
DURAPREP 26ML APPLICATOR (WOUND CARE) ×6 IMPLANT
ELECT CAUTERY BLADE 6.4 (BLADE) ×3 IMPLANT
ELECT REM PT RETURN 9FT ADLT (ELECTROSURGICAL) ×3
ELECTRODE REM PT RTRN 9FT ADLT (ELECTROSURGICAL) ×1 IMPLANT
FACESHIELD WRAPAROUND (MASK) ×3 IMPLANT
FACESHIELD WRAPAROUND OR TEAM (MASK) ×1 IMPLANT
GLOVE BIO SURGEON STRL SZ7 (GLOVE) ×3 IMPLANT
GLOVE BIOGEL PI IND STRL 7.0 (GLOVE) ×1 IMPLANT
GLOVE BIOGEL PI IND STRL 7.5 (GLOVE) ×1 IMPLANT
GLOVE BIOGEL PI INDICATOR 7.0 (GLOVE) ×2
GLOVE BIOGEL PI INDICATOR 7.5 (GLOVE) ×2
GLOVE SS BIOGEL STRL SZ 7.5 (GLOVE) ×1 IMPLANT
GLOVE SUPERSENSE BIOGEL SZ 7.5 (GLOVE) ×2
GOWN STRL REUS W/ TWL LRG LVL3 (GOWN DISPOSABLE) ×1 IMPLANT
GOWN STRL REUS W/ TWL XL LVL3 (GOWN DISPOSABLE) ×2 IMPLANT
GOWN STRL REUS W/TWL LRG LVL3 (GOWN DISPOSABLE) ×3
GOWN STRL REUS W/TWL XL LVL3 (GOWN DISPOSABLE) ×6
HANDPIECE INTERPULSE COAX TIP (DISPOSABLE) ×3
HOOD PEEL AWAY FACE SHEILD DIS (HOOD) ×6 IMPLANT
IMMOBILIZER KNEE 22 UNIV (SOFTGOODS) ×3 IMPLANT
KIT BASIN OR (CUSTOM PROCEDURE TRAY) ×3 IMPLANT
KIT ROOM TURNOVER OR (KITS) ×3 IMPLANT
MANIFOLD NEPTUNE II (INSTRUMENTS) ×3 IMPLANT
MARKER SKIN DUAL TIP RULER LAB (MISCELLANEOUS) ×3 IMPLANT
NEEDLE HYPO 22GX1.5 SAFETY (NEEDLE) ×6 IMPLANT
NS IRRIG 1000ML POUR BTL (IV SOLUTION) ×3 IMPLANT
PACK TOTAL JOINT (CUSTOM PROCEDURE TRAY) ×3 IMPLANT
PAD ARMBOARD 7.5X6 YLW CONV (MISCELLANEOUS) ×6 IMPLANT
SET HNDPC FAN SPRY TIP SCT (DISPOSABLE) ×1 IMPLANT
STRIP CLOSURE SKIN 1/2X4 (GAUZE/BANDAGES/DRESSINGS) ×2 IMPLANT
SUCTION FRAZIER HANDLE 10FR (MISCELLANEOUS) ×2
SUCTION TUBE FRAZIER 10FR DISP (MISCELLANEOUS) ×1 IMPLANT
SUT MNCRL AB 3-0 PS2 18 (SUTURE) ×3 IMPLANT
SUT VIC AB 0 CT1 27 (SUTURE) ×6
SUT VIC AB 0 CT1 27XBRD ANBCTR (SUTURE) ×2 IMPLANT
SUT VIC AB 1 CT1 27 (SUTURE) ×6
SUT VIC AB 1 CT1 27XBRD ANBCTR (SUTURE) ×1 IMPLANT
SUT VIC AB 2-0 CT1 27 (SUTURE) ×6
SUT VIC AB 2-0 CT1 TAPERPNT 27 (SUTURE) ×2 IMPLANT
SYR CONTROL 10ML LL (SYRINGE) ×6 IMPLANT
TOWEL OR 17X24 6PK STRL BLUE (TOWEL DISPOSABLE) ×3 IMPLANT
TOWEL OR 17X26 10 PK STRL BLUE (TOWEL DISPOSABLE) ×3 IMPLANT
TRAY CATH 16FR W/PLASTIC CATH (SET/KITS/TRAYS/PACK) IMPLANT
TRAY FOLEY CATH SILVER 16FR (SET/KITS/TRAYS/PACK) ×3 IMPLANT
TUBE CONNECTING 12'X1/4 (SUCTIONS) ×1
TUBE CONNECTING 12X1/4 (SUCTIONS) ×2 IMPLANT
WATER STERILE IRR 1000ML POUR (IV SOLUTION) ×3 IMPLANT
YANKAUER SUCT BULB TIP NO VENT (SUCTIONS) ×3 IMPLANT

## 2017-12-06 NOTE — Op Note (Signed)
MRN:     [id number] DOB/AGE:    27-Jun-1956 / 62 y.o.       OPERATIVE REPORT    DATE OF PROCEDURE:  12/06/2017       PREOPERATIVE DIAGNOSIS:   PRIMARY LOCALIZED OA LEFT KNEE      Estimated body mass index is 30.34 kg/m as calculated from the following:   Height as of this encounter: 5\' 6"  (1.676 m).   Weight as of this encounter: 85.3 kg (188 lb).                                                        POSTOPERATIVE DIAGNOSIS:   SAME                                                                     PROCEDURE:  Procedure(s): LEFT TOTAL KNEE ARTHROPLASTY Using Depuy Attune RP implants #5 Femur, #6Tibia, 44mm  RP bearing, 32 Patella     SURGEON: Lorn Junes    ASSISTANT:  HC Marttinsen PA-C   (Present and scrubbed throughout the case, critical for assistance with exposure, retraction, instrumentation, and closure.)         ANESTHESIA: Spinal with Adductor Nerve Block     TOURNIQUET TIME: 42VZD   COMPLICATIONS:  None     SPECIMENS: None   INDICATIONS FOR PROCEDURE: The patient has  DJD LEFT KNEE, varus deformities, XR shows bone on bone arthritis. Patient has failed all conservative measures including anti-inflammatory medicines, narcotics, attempts at  exercise and weight loss, cortisone injections and viscosupplementation.  Risks and benefits of surgery have been discussed, questions answered.   DESCRIPTION OF PROCEDURE: The patient identified by armband, received  right femoral nerve block and IV antibiotics, in the holding area at [facility]. Patient taken to the operating room, appropriate anesthetic  monitors were attached Spinal anesthesia induced with  the patient in supine position, Foley catheter was inserted. Tourniquet  applied high to the operative thigh. Lateral post and foot positioner  applied to the table, the lower extremity was then prepped and draped  in usual sterile fashion from the ankle to the tourniquet. Time-out procedure was performed. The limb  was wrapped with an Esmarch bandage and the tourniquet inflated to 365 mmHg. We began the operation by making the anterior midline incision starting at handbreadth above the patella going over the patella 1 cm medial to and  4 cm distal to the tibial tubercle. Small bleeders in the skin and the  subcutaneous tissue identified and cauterized. Transverse retinaculum was incised and reflected medially and a medial parapatellar arthrotomy was accomplished. the patella was everted and theprepatellar fat pad resected. The superficial medial collateral  ligament was then elevated from anterior to posterior along the proximal  flare of the tibia and anterior half of the menisci resected. The knee was hyperflexed exposing bone on bone arthritis. Peripheral and notch osteophytes as well as the cruciate ligaments were then resected. We continued to  work our way around posteriorly along the proximal tibia, and externally  rotated the tibia subluxing it  out from underneath the femur. A McHale  retractor was placed through the notch and a lateral Hohmann retractor  placed, and we then drilled through the proximal tibia in line with the  axis of the tibia followed by an intramedullary guide rod and 2-degree  posterior slope cutting guide. The tibial cutting guide was pinned into place  allowing resection of 4 mm of bone medially and about 6 mm of bone  laterally because of her varus deformity. Satisfied with the tibial resection, we then  entered the distal femur 2 mm anterior to the PCL origin with the  intramedullary guide rod and applied the distal femoral cutting guide  set at 39mm, with 5 degrees of valgus. This was pinned along the  epicondylar axis. At this point, the distal femoral cut was accomplished without difficulty. We then sized for a #5 femoral component and pinned the guide in 3 degrees of external rotation.The chamfer cutting guide was pinned into place. The anterior, posterior, and chamfer cuts  were accomplished without difficulty followed by  the  RP box cutting guide and the box cut. We also removed posterior osteophytes from the posterior femoral condyles. At this  time, the knee was brought into full extension. We checked our  extension and flexion gaps and found them symmetric at 76mm.  The patella thickness measured at 25 mm. We set the cutting guide at 15 and removed the posterior 9.5-10 mm  of the patella sized for 32 button and drilled the lollipop. The knee  was then once again hyperflexed exposing the proximal tibia. We sized for a #6 tibial base plate, applied the smokestack and the conical reamer followed by the the Delta fin keel punch. We then hammered into place the  RP trial femoral component, inserted a 1 trial bearing, trial patellar button, and took the knee through range of motion from 0-130 degrees. No thumb pressure was required for patellar  tracking. At this point, all trial components were removed, a double batch of DePuy HV cement   was mixed and applied to all bony metallic mating surfaces except for the posterior condyles of the femur itself. In order, we  hammered into place the tibial tray and removed excess cement, the femoral component and removed excess cement, a 69mm  RP bearing  was inserted, and the knee brought to full extension with compression.  The patellar button was clamped into place, and excess cement  removed. While the cement cured the wound was irrigated out with normal saline solution pulse lavage.. Ligament stability and patellar tracking were checked and found to be excellent.. The parapatellar arthrotomy was closed with  #1 Vicryl suture. The subcutaneous tissue with 0 and 2-0 undyed  Vicryl suture, and 4-0 Monocryl.. A dressing of Aquaseal,  4 x 4, dressing sponges, Webril, and Ace wrap applied. Needle and sponge count were correct times 2.The patient awakened, extubated, and taken to recovery room without difficulty. Vascular status was  normal, pulses 2+ and symmetric.   Lorn Junes 12/06/2017, 9:06 AM

## 2017-12-06 NOTE — Interval H&P Note (Signed)
History and Physical Interval Note:  12/06/2017 7:02 AM  Nancy Steele  has presented today for surgery, with the diagnosis of DJD LEFT KNEE  The various methods of treatment have been discussed with the patient and family. After consideration of risks, benefits and other options for treatment, the patient has consented to  Procedure(s): LEFT TOTAL KNEE ARTHROPLASTY (Left) as a surgical intervention .  The patient's history has been reviewed, patient examined, no change in status, stable for surgery.  I have reviewed the patient's chart and labs.  Questions were answered to the patient's satisfaction.     Lorn Junes

## 2017-12-06 NOTE — Anesthesia Procedure Notes (Signed)
Anesthesia Regional Block: Adductor canal block   Pre-Anesthetic Checklist: ,, timeout performed, Correct Patient, Correct Site, Correct Laterality, Correct Procedure,, site marked, risks and benefits discussed, Surgical consent,  Pre-op evaluation,  At surgeon's request and post-op pain management  Laterality: Left  Prep: chloraprep       Needles:  Injection technique: Single-shot  Needle Type: Echogenic Stimulator Needle     Needle Length: 9cm  Needle Gauge: 21     Additional Needles:   Procedures:,,,, ultrasound used (permanent image in chart),,,,  Narrative:  Start time: 12/06/2017 7:00 AM End time: 12/06/2017 7:10 AM Injection made incrementally with aspirations every 5 mL.  Performed by: Personally  Anesthesiologist: Murvin Natal, MD  Additional Notes: Functioning IV was confirmed and monitors were applied.  A 57mm 21ga Arrow echogenic stimulator needle was used. Sterile prep, hand hygiene and sterile gloves were used.  Negative aspiration and negative test dose prior to incremental administration of local anesthetic. The patient tolerated the procedure well.

## 2017-12-06 NOTE — Progress Notes (Signed)
Orthopedic Tech Progress Note Patient Details:  Nancy Steele September 02, 1956 [id number]  CPM Left Knee CPM Left Knee: On Left Knee Flexion (Degrees): 90 Left Knee Extension (Degrees): 0 Additional Comments: Foot roll  Post Interventions Patient Tolerated: Well Instructions Provided: Care of device, Adjustment of device  [name] 12/06/2017, 10:14 AM

## 2017-12-06 NOTE — Anesthesia Procedure Notes (Signed)
Spinal  Patient location during procedure: OR Start time: 12/06/2017 7:20 AM End time: 12/06/2017 7:25 AM Staffing Anesthesiologist: Murvin Natal, MD Performed: anesthesiologist  Preanesthetic Checklist Completed: patient identified, surgical consent, pre-op evaluation, timeout performed, IV checked, risks and benefits discussed and monitors and equipment checked Spinal Block Patient position: sitting Prep: DuraPrep Patient monitoring: cardiac monitor, continuous pulse ox and blood pressure Approach: midline Location: L3-4 Injection technique: single-shot Needle Needle type: Pencan  Needle gauge: 24 G Needle length: 9 cm Assessment Sensory level: T10 Additional Notes Functioning IV was confirmed and monitors were applied. Sterile prep and drape, including hand hygiene and sterile gloves were used. The patient was positioned and the spine was prepped. The skin was anesthetized with lidocaine.  Free flow of clear CSF was obtained prior to injecting local anesthetic into the CSF.  The spinal needle aspirated freely following injection.  The needle was carefully withdrawn.  The patient tolerated the procedure well.

## 2017-12-06 NOTE — Transfer of Care (Signed)
Immediate Anesthesia Transfer of Care Note  Patient: Nancy Steele  Procedure(s) Performed: LEFT TOTAL KNEE ARTHROPLASTY (Left Knee)  Patient Location: PACU  Anesthesia Type:Spinal and MAC combined with regional for post-op pain  Level of Consciousness: awake, alert  and oriented  Airway & Oxygen Therapy: Patient Spontanous Breathing and Patient connected to nasal cannula oxygen  Post-op Assessment: Report given to RN and Post -op Vital signs reviewed and stable  Post vital signs: Reviewed and stable  Last Vitals:  Vitals:   12/06/17 0612 12/06/17 0949  BP: 107/66   Pulse: 64   Resp: 20   Temp: 36.7 C (!) (P) 36.4 C  SpO2: 98%     Last Pain:  Vitals:   12/06/17 0612  TempSrc: Oral      Patients Stated Pain Goal: 4 (36/46/80 3212)  Complications: No apparent anesthesia complications

## 2017-12-06 NOTE — Evaluation (Signed)
Physical Therapy Evaluation Patient Details Name: Nancy Steele MRN: [id number] DOB: 1956-05-07 Today's Date: 12/06/2017   History of Present Illness  Pt is a 62 y/o female s/p elective L TKA. PMH inlucdes glaucoma, HTN, and bilat carpal tunnel release.   Clinical Impression  Pt s/p surgery above with deficits below. Pt tolerated gait training well and required min to min guard A for mobility with RW. Supine HEP initiated and reviewed knee precautions. Will continue to follow acutely to maximize functional mobility independence and safety.     Follow Up Recommendations Follow surgeon's recommendation for DC plan and follow-up therapies;Supervision for mobility/OOB    Equipment Recommendations  None recommended by PT    Recommendations for Other Services       Precautions / Restrictions Precautions Precautions: Knee Precaution Booklet Issued: Yes (comment) Precaution Comments: Reviewed supine ther ex.  Restrictions Weight Bearing Restrictions: Yes LLE Weight Bearing: Weight bearing as tolerated      Mobility  Bed Mobility Overal bed mobility: Needs Assistance Bed Mobility: Supine to Sit     Supine to sit: Min assist     General bed mobility comments: Min A for LLE management.   Transfers Overall transfer level: Needs assistance Equipment used: Rolling walker (2 wheeled) Transfers: Sit to/from Stand Sit to Stand: Min assist         General transfer comment: Min A for lift assist and steadying assist. Verbal cues for safe hand placement.   Ambulation/Gait Ambulation/Gait assistance: Min guard Ambulation Distance (Feet): 50 Feet Assistive device: Rolling walker (2 wheeled) Gait Pattern/deviations: Step-to pattern;Decreased step length - right;Decreased step length - left;Antalgic;Decreased weight shift to left Gait velocity: Decreased  Gait velocity interpretation: Below normal speed for age/gender General Gait Details: Slow, antalgic gait. Verbal cues for  sequencing using RW. Decreased weightshift to LLE secondary to pain.   Stairs            Wheelchair Mobility    Modified Rankin (Stroke Patients Only)       Balance Overall balance assessment: Needs assistance Sitting-balance support: No upper extremity supported;Feet supported Sitting balance-Leahy Scale: Good     Standing balance support: Bilateral upper extremity supported;During functional activity Standing balance-Leahy Scale: Poor Standing balance comment: Reliant on BUE support.                              Pertinent Vitals/Pain Pain Assessment: 0-10 Pain Score: 4  Pain Location: L knee  Pain Descriptors / Indicators: Aching;Operative site guarding Pain Intervention(s): Limited activity within patient's tolerance;Monitored during session;Repositioned    Home Living Family/patient expects to be discharged to:: Private residence Living Arrangements: Spouse/significant other Available Help at Discharge: Family;Available 24 hours/day Type of Home: House Home Access: Stairs to enter Entrance Stairs-Rails: Psychiatric nurse of Steps: 5 Home Layout: Two level Home Equipment: Walker - 2 wheels;Bedside commode      Prior Function Level of Independence: Independent               Hand Dominance   Dominant Hand: Left    Extremity/Trunk Assessment   Upper Extremity Assessment Upper Extremity Assessment: Defer to OT evaluation    Lower Extremity Assessment Lower Extremity Assessment: LLE deficits/detail LLE Deficits / Details: Numbness in the foot. Deficits consistent with post op pain and weakness. Able to perform ther ex below.     Cervical / Trunk Assessment Cervical / Trunk Assessment: Normal  Communication   Communication: No difficulties  Cognition Arousal/Alertness: Awake/alert Behavior During Therapy: WFL for tasks assessed/performed Overall Cognitive Status: Within Functional Limits for tasks assessed                                         General Comments General comments (skin integrity, edema, etc.): Pt's husband and children present during session.     Exercises Total Joint Exercises Ankle Circles/Pumps: AROM;Both;20 reps Quad Sets: AROM;Left;10 reps Towel Squeeze: AROM;Both;10 reps Heel Slides: AROM;Left;10 reps   Assessment/Plan    PT Assessment Patient needs continued PT services  PT Problem List Decreased strength;Decreased range of motion;Decreased balance;Decreased mobility;Decreased knowledge of use of DME;Decreased knowledge of precautions;Pain;Impaired sensation       PT Treatment Interventions DME instruction;Gait training;Stair training;Functional mobility training;Therapeutic activities;Therapeutic exercise;Balance training;Neuromuscular re-education;Patient/family education    PT Goals (Current goals can be found in the Care Plan section)  Acute Rehab PT Goals Patient Stated Goal: to go home  PT Goal Formulation: With patient Time For Goal Achievement: 12/20/17 Potential to Achieve Goals: Good    Frequency 7X/week   Barriers to discharge        Co-evaluation               AM-PAC PT "6 Clicks" Daily Activity  Outcome Measure Difficulty turning over in bed (including adjusting bedclothes, sheets and blankets)?: A Little Difficulty moving from lying on back to sitting on the side of the bed? : Unable Difficulty sitting down on and standing up from a chair with arms (e.g., wheelchair, bedside commode, etc,.)?: Unable Help needed moving to and from a bed to chair (including a wheelchair)?: A Little Help needed walking in hospital room?: A Little Help needed climbing 3-5 steps with a railing? : A Lot 6 Click Score: 13    End of Session Equipment Utilized During Treatment: Gait belt;Left knee immobilizer Activity Tolerance: Patient tolerated treatment well Patient left: in chair;with call bell/phone within reach;with family/visitor  present Nurse Communication: Mobility status PT Visit Diagnosis: Other abnormalities of gait and mobility (R26.89);Pain Pain - Right/Left: Left Pain - part of body: Knee    Time: 8177-1165 PT Time Calculation (min) (ACUTE ONLY): 34 min   Charges:   PT Evaluation $PT Eval Low Complexity: 1 Low PT Treatments $Gait Training: 8-22 mins   PT G Codes:        Leighton Ruff, PT, DPT  Acute Rehabilitation Services  Pager: [phone]   Rudean Hitt 12/06/2017, 2:45 PM

## 2017-12-06 NOTE — Anesthesia Postprocedure Evaluation (Signed)
Anesthesia Post Note  Patient: Nancy Steele  Procedure(s) Performed: LEFT TOTAL KNEE ARTHROPLASTY (Left Knee)     Patient location during evaluation: PACU Anesthesia Type: Regional and Spinal Level of consciousness: oriented and awake and alert Pain management: pain level controlled Vital Signs Assessment: post-procedure vital signs reviewed and stable Respiratory status: spontaneous breathing, respiratory function stable and patient connected to nasal cannula oxygen Cardiovascular status: blood pressure returned to baseline and stable Postop Assessment: no headache, no backache, no apparent nausea or vomiting and spinal receding Anesthetic complications: no    Last Vitals:  Vitals:   12/06/17 1051 12/06/17 1240  BP: 121/84 122/85  Pulse: 67 77  Resp: 12 18  Temp: (!) 36.3 C 37 C  SpO2: 99% 98%    Last Pain:  Vitals:   12/06/17 1248  TempSrc:   PainSc: 5                  Ryan P Ellender

## 2017-12-06 NOTE — Discharge Instructions (Signed)

## 2017-12-07 ENCOUNTER — Encounter (HOSPITAL_COMMUNITY): Payer: Self-pay | Admitting: Orthopedic Surgery

## 2017-12-07 DIAGNOSIS — M1712 Unilateral primary osteoarthritis, left knee: Secondary | ICD-10-CM | POA: Diagnosis not present

## 2017-12-07 LAB — CBC
HCT: 32.4 % — ABNORMAL LOW (ref 36.0–46.0)
HEMOGLOBIN: 11 g/dL — AB (ref 12.0–15.0)
MCH: 31.1 pg (ref 26.0–34.0)
MCHC: 34 g/dL (ref 30.0–36.0)
MCV: 91.5 fL (ref 78.0–100.0)
Platelets: 213 10*3/uL (ref 150–400)
RBC: 3.54 MIL/uL — AB (ref 3.87–5.11)
RDW: 13.3 % (ref 11.5–15.5)
WBC: 12.9 10*3/uL — ABNORMAL HIGH (ref 4.0–10.5)

## 2017-12-07 LAB — BASIC METABOLIC PANEL
Anion gap: 11 (ref 5–15)
BUN: 9 mg/dL (ref 6–20)
CHLORIDE: 105 mmol/L (ref 101–111)
CO2: 24 mmol/L (ref 22–32)
Calcium: 9.1 mg/dL (ref 8.9–10.3)
Creatinine, Ser: 0.68 mg/dL (ref 0.44–1.00)
GFR calc Af Amer: >60 mL/min (ref >60)
GFR calc non Af Amer: >60 mL/min (ref >60)
GLUCOSE: 158 mg/dL — AB (ref 65–99)
POTASSIUM: 4.4 mmol/L (ref 3.5–5.1)
Sodium: 140 mmol/L (ref 135–145)

## 2017-12-07 NOTE — Care Management Note (Signed)
Case Management Note  Patient Details  Name: Nancy Steele MRN: [id number] Date of Birth: Dec 19, 1955  Subjective/Objective:   62 yr old female s/p left total knee arthroplasty.                 Action/Plan: Patient was preoperatively setup with [facility], no changes. Has DME. Patient will have family support at discharge.    Expected Discharge Date:  12/07/17               Expected Discharge Plan:  Nancy Steele  In-House Referral:  NA  Discharge planning Services  CM Consult  Post Acute Care Choice:  Durable Medical Equipment, Home Health Choice offered to:  Patient  DME Arranged:  CPM(has RW and 3in1) DME Agency:  [employer]  HH Arranged:  PT HH Agency:  [facility] (formerly [employer])  Status of Service:  Completed, signed off  If discussed at [employer], dates discussed:    Additional Comments:  Ninfa Meeker, RN 12/07/2017, 11:17 AM

## 2017-12-07 NOTE — Progress Notes (Signed)
Discharge instructions, RX's and follow up appts explained and provided to patient Patient verbalized understanding. Patient left floor via wheelchair accompanied by volunteers. No c/o pain or shortness of breath at d/c.  Briyan Kleven, Tivis Ringer, RN

## 2017-12-07 NOTE — Discharge Summary (Signed)
Discharge Summary  Patient ID: Nancy Steele MRN: [id number] DOB/AGE: 22-Feb-1956 62 y.o.  Admit date: 12/06/2017 Discharge date: 12/07/2017  Admission Diagnoses:  Primary localized osteoarthritis of left knee  Discharge Diagnoses:  Principal Problem:   Primary localized osteoarthritis of left knee Active Problems:   Hypertension   Primary osteoarthritis of knee   Past Medical History:  Diagnosis Date  . Acute medial meniscus tear of left knee   . Allergy    seasonal  . Arthritis   . Blood transfusion without reported diagnosis    28 yrs ago  . Cataract    left eye-removal with implant   . DDD (degenerative disc disease), cervical   . Family history of adverse reaction to anesthesia    brother slow to wake up  . Glaucoma    glucoma like issue- cuased from past surgery increasing pressurein the eye   . Hypertension   . Primary localized osteoarthritis of left knee 11/24/2017    Surgeries: Procedure(s): LEFT TOTAL KNEE ARTHROPLASTY on 12/06/2017   Consultants (if any):   Discharged Condition: Improved  Hospital Course: Nancy Steele is an 62 y.o. female who was admitted 12/06/2017 with a diagnosis of Primary localized osteoarthritis of left knee and went to the operating room on 12/06/2017 and underwent the above named procedures.    She was given perioperative antibiotics:  Anti-infectives (From admission, onward)   Start     Dose/Rate Route Frequency Ordered Stop   12/06/17 1330  ceFAZolin (ANCEF) IVPB 1 g/50 mL premix     1 g 100 mL/hr over 30 Minutes Intravenous Every 6 hours 12/06/17 1126 12/06/17 2019   12/06/17 0600  ceFAZolin (ANCEF) IVPB 2g/100 mL premix    Comments:  Luna Glasgow, PA confirmed that patient can tolerate Ancef.   2 g 200 mL/hr over 30 Minutes Intravenous  Once 12/06/17 0548 12/06/17 0754    .  She was given sequential compression devices, early ambulation, and Aspirin for DVT prophylaxis.  She benefited maximally from the  hospital stay and there were no complications.    Recent vital signs:  Vitals:   12/07/17 0136 12/07/17 0428  BP: 116/69 118/66  Pulse: 80 71  Resp:  20  Temp: 98.4 F (36.9 C) 97.6 F (36.4 C)  SpO2: 100% 95%    Recent laboratory studies:  Lab Results  Component Value Date   HGB 12.3 11/26/2017   HGB 12.7 09/08/2017   HGB 12.6 08/25/2016   Lab Results  Component Value Date   WBC 5.5 11/26/2017   PLT 217 11/26/2017   Lab Results  Component Value Date   INR 1.04 11/26/2017   Lab Results  Component Value Date   NA 139 11/26/2017   K 3.9 11/26/2017   CL 103 11/26/2017   CO2 24 11/26/2017   BUN 17 11/26/2017   CREATININE 0.82 11/26/2017   GLUCOSE 89 11/26/2017    Discharge Medications:   Allergies as of 12/07/2017      Reactions   Penicillins Swelling, Rash, Other (See Comments)   SWELLING OF THE THROAT, OTHER CHILDHOOD REACTION PCN REACTION WITH IMMEDIATE RASH, FACIAL/TONGUE/THROAT SWELLING, SOB, OR LIGHTHEADEDNESS WITH HYPOTENSION:  #  #  #  YES  #  #  #  Has patient had a PCN reaction causing severe rash involving mucus membranes or skin necrosis: Unknown Has patient had a PCN reaction that required hospitalization: No Has patient had a PCN reaction occurring within the last 10 years: No  Sulfa Antibiotics Nausea And Vomiting   Sulfasalazine Nausea And Vomiting   Vancomycin Hives      Medication List    TAKE these medications   acetaminophen 500 MG tablet Commonly known as:  TYLENOL Take 2 tablets (1,000 mg total) by mouth every 8 (eight) hours for 14 days. For Pain. What changed:    how much to take  when to take this  reasons to take this  additional instructions   aspirin EC 325 MG tablet Take 1 tablet (325 mg total) by mouth daily. For 30 days post op for DVT Prophylaxis   atorvastatin 20 MG tablet Commonly known as:  LIPITOR Take 1 tablet (20 mg total) by mouth daily.   CALTRATE 600+D 600-800 MG-UNIT Tabs Generic drug:  Calcium  Carb-Cholecalciferol Take 1 tablet by mouth daily.   COMBIGAN 0.2-0.5 % ophthalmic solution Generic drug:  brimonidine-timolol INSTILL 1 DROP INTO LEFT EYE TWICE A DAY   docusate sodium 100 MG capsule Commonly known as:  COLACE Take 1 capsule (100 mg total) by mouth 2 (two) times daily. To prevent constipation while taking pain medication.   lisinopril 10 MG tablet Commonly known as:  PRINIVIL,ZESTRIL TAKE 1 TABLET BY MOUTH EVERY DAY What changed:    how much to take  how to take this  when to take this   methocarbamol 500 MG tablet Commonly known as:  ROBAXIN Take 1 tablet (500 mg total) by mouth every 6 (six) hours as needed for muscle spasms.   multivitamin with minerals Tabs tablet Take 1 tablet by mouth daily.   naproxen sodium 220 MG tablet Commonly known as:  ALEVE Take 220 mg by mouth 2 (two) times daily with a meal.   omeprazole 20 MG capsule Commonly known as:  PRILOSEC Take 1 capsule (20 mg total) by mouth daily. 30 days for gastroprotection while taking NSAIDs.   ondansetron 4 MG tablet Commonly known as:  ZOFRAN Take 1 tablet (4 mg total) by mouth every 8 (eight) hours as needed for nausea or vomiting.   oxyCODONE 5 MG immediate release tablet Commonly known as:  ROXICODONE Take 1-2 tablets (5-10 mg total) by mouth every 4 (four) hours as needed for breakthrough pain (S/P Total Knee Arthroplasty.).   SAXENDA 18 MG/3ML Sopn Generic drug:  Liraglutide -Weight Management Inject 3 mg into the skin daily.   SYSTANE ULTRA 0.4-0.3 % Soln Generic drug:  Polyethyl Glycol-Propyl Glycol Place 1-2 drops into both eyes 3 (three) times daily as needed (for dry/irritated eyes.).       Diagnostic Studies: Dg Knee Left Port  Result Date: 12/06/2017 CLINICAL DATA:  Knee replacement EXAM: PORTABLE LEFT KNEE - 1-2 VIEW COMPARISON:  12/06/2017 none FINDINGS: Total knee replacement in satisfactory position alignment. Gas in the knee joint. No fracture or immediate  complication IMPRESSION: Satisfactory left knee replacement. Electronically Signed   By: Franchot Gallo M.D.   On: 12/06/2017 10:53    Disposition: 01-Home or Self Care  Discharge Instructions    Discharge patient   Complete by:  As directed    After therapy   Discharge disposition:  01-Home or Self Care   Discharge patient date:  12/07/2017      Follow-up Information    Elsie Saas, MD Follow up.   Specialty:  Orthopedic Surgery Contact information: [address] [place] [address] [phone]            Signed: Prudencio Burly III PA-C 12/07/2017, 6:35 AM

## 2017-12-07 NOTE — Plan of Care (Signed)
  Completed/Met Education: Knowledge of General Education information will improve 12/07/2017 1453 - Completed/Met by Emmaline Life, RN Health Behavior/Discharge Planning: Ability to manage health-related needs will improve 12/07/2017 1453 - Completed/Met by Emmaline Life, RN Clinical Measurements: Ability to maintain clinical measurements within normal limits will improve 12/07/2017 1453 - Completed/Met by Emmaline Life, RN Will remain free from infection 12/07/2017 1453 - Completed/Met by Emmaline Life, RN Diagnostic test results will improve 12/07/2017 1453 - Completed/Met by Emmaline Life, RN Respiratory complications will improve 12/07/2017 1453 - Completed/Met by Emmaline Life, RN Cardiovascular complication will be avoided 12/07/2017 1453 - Completed/Met by Emmaline Life, RN Activity: Risk for activity intolerance will decrease 12/07/2017 1453 - Completed/Met by Emmaline Life, RN Coping: Level of anxiety will decrease 12/07/2017 1453 - Completed/Met by Emmaline Life, RN Elimination: Will not experience complications related to bowel motility 12/07/2017 1453 - Completed/Met by Emmaline Life, RN Will not experience complications related to urinary retention 12/07/2017 1453 - Completed/Met by Emmaline Life, RN Pain Managment: General experience of comfort will improve 12/07/2017 1453 - Completed/Met by Emmaline Life, RN Safety: Ability to remain free from injury will improve 12/07/2017 1453 - Completed/Met by Emmaline Life, RN Skin Integrity: Risk for impaired skin integrity will decrease 12/07/2017 1453 - Completed/Met by Emmaline Life, RN Education: Knowledge of the prescribed therapeutic regimen will improve 12/07/2017 1453 - Completed/Met by Emmaline Life, RN Activity: Ability to avoid complications of mobility impairment will improve 12/07/2017 1453 - Completed/Met by Emmaline Life, RN Range of joint motion will  improve 12/07/2017 1453 - Completed/Met by Emmaline Life, RN Clinical Measurements: Postoperative complications will be avoided or minimized 12/07/2017 1453 - Completed/Met by Emmaline Life, RN Pain Management: Pain level will decrease with appropriate interventions 12/07/2017 1453 - Completed/Met by Emmaline Life, RN Skin Integrity: Signs of wound healing will improve 12/07/2017 1453 - Completed/Met by Emmaline Life, RN

## 2017-12-07 NOTE — Evaluation (Signed)
Occupational Therapy Evaluation Patient Details Name: Nancy Steele MRN: [id number] DOB: 10/30/1955 Today's Date: 12/07/2017    History of Present Illness Pt is a 62 y/o female s/p elective L TKA. PMH inlucdes glaucoma, HTN, and bilat carpal tunnel release.    Clinical Impression   Patient evaluated by Occupational Therapy with no further acute OT needs identified. All education has been completed and the patient has no further questions. See below for any follow-up Occupational Therapy or equipment needs. OT to sign off. Thank you for referral.      Follow Up Recommendations  No OT follow up    Equipment Recommendations  None recommended by OT    Recommendations for Other Services       Precautions / Restrictions Precautions Precautions: Knee Precaution Comments: reviewed knee extension Restrictions Weight Bearing Restrictions: Yes LLE Weight Bearing: Weight bearing as tolerated      Mobility Bed Mobility Overal bed mobility: Needs Assistance;Modified Independent             General bed mobility comments: up in chair  Transfers Overall transfer level: Needs assistance Equipment used: Rolling walker (2 wheeled) Transfers: Sit to/from Stand Sit to Stand: Supervision         General transfer comment: also demonstrates tub transfer from 3n1     Balance Overall balance assessment: Needs assistance   Sitting balance-Leahy Scale: Good       Standing balance-Leahy Scale: Fair Standing balance comment: washed hands without UE support, toileted on her own for hygiene                           ADL either performed or assessed with clinical judgement   ADL Overall ADL's : Modified independent                                       General ADL Comments: able to reach feet    Educated patient on knee full extension with return demonstration, educated tub transfer,never to wash directly on incision site, always use fresh clean  linen (one time use then place in laundry), avoid water under bandage and benefits of wrapping dressing, sleeping positioning, avoid putting pillow under knee, educated on use of a belt or sheet as a leg lifter. Pt is able to reach feet so no AE required. Pt has reacher at home already    Ada?: No apparent visual deficits     Perception     Praxis      Pertinent Vitals/Pain Pain Assessment: 0-10 Pain Score: 3  Pain Location: L knee  Pain Descriptors / Indicators: Aching;Operative site guarding Pain Intervention(s): Monitored during session;Repositioned;Premedicated before session;Ice applied     Hand Dominance Left   Extremity/Trunk Assessment Upper Extremity Assessment Upper Extremity Assessment: Overall WFL for tasks assessed   Lower Extremity Assessment Lower Extremity Assessment: Defer to PT evaluation   Cervical / Trunk Assessment Cervical / Trunk Assessment: Normal   Communication Communication Communication: No difficulties   Cognition Arousal/Alertness: Awake/alert Behavior During Therapy: WFL for tasks assessed/performed Overall Cognitive Status: Within Functional Limits for tasks assessed                                     General Comments  dressing dry and intact  Exercises Total Joint Exercises Ankle Circles/Pumps: AROM;Both;10 reps;Seated Quad Sets: AROM;10 reps;Left;Seated Heel Slides: AROM;Left;10 reps;Seated Hip ABduction/ADduction: AROM;10 reps;Seated;Left Straight Leg Raises: Seated;10 reps;Left;AROM Goniometric ROM: -10 to 78   Shoulder Instructions      Home Living Family/patient expects to be discharged to:: Private residence Living Arrangements: Spouse/significant other Available Help at Discharge: Family;Available 24 hours/day Type of Home: House Home Access: Stairs to enter [employer] of Steps: 5 Entrance Stairs-Rails: Right;Left Home Layout: Two level Alternate Level  Stairs-Number of Steps: flight  Alternate Level Stairs-Rails: Left Bathroom Shower/Tub: Teacher, early years/pre: Standard     Home Equipment: Environmental consultant - 2 wheels;Bedside commode   Additional Comments: son and spouse to (A) upon d/c . has 2 dogs in the home that are children dogs during the day       Prior Functioning/Environment Level of Independence: Independent                 OT Problem List:        OT Treatment/Interventions:      OT Goals(Current goals can be found in the care plan section) Acute Rehab OT Goals Patient Stated Goal: to go home  OT Goal Formulation: With patient  OT Frequency:     Barriers to D/C:            Co-evaluation              AM-PAC PT "6 Clicks" Daily Activity     Outcome Measure Help from another person eating meals?: None Help from another person taking care of personal grooming?: None Help from another person toileting, which includes using toliet, bedpan, or urinal?: None Help from another person bathing (including washing, rinsing, drying)?: None Help from another person to put on and taking off regular upper body clothing?: None Help from another person to put on and taking off regular lower body clothing?: None 6 Click Score: 24   End of Session Equipment Utilized During Treatment: Gait belt;Rolling walker CPM Left Knee CPM Left Knee: Off Additional Comments: Foot roll Nurse Communication: Mobility status;Precautions  Activity Tolerance: Patient tolerated treatment well Patient left: in chair;with call bell/phone within reach;with family/visitor present  OT Visit Diagnosis: Unsteadiness on feet (R26.81)                Time: 2637-8588 OT Time Calculation (min): 30 min Charges:  OT General Charges $OT Visit: 1 Visit OT Evaluation $OT Eval Moderate Complexity: 1 Mod OT Treatments $Self Care/Home Management : 8-22 mins G-Codes:      Jeri Modena   OTR/L Pager: [phone] Office:  [phone] .   Parke Poisson B 12/07/2017, 2:27 PM

## 2017-12-07 NOTE — Progress Notes (Signed)
Physical Therapy Treatment Patient Details Name: Nancy Steele MRN: [id number] DOB: 1956-07-21 Today's Date: 12/07/2017    History of Present Illness Pt is a 62 y/o female s/p elective L TKA. PMH inlucdes glaucoma, HTN, and bilat carpal tunnel release.     PT Comments    Patient progressing with mobility practice for stairs, ambulation, HEP and simulated car transfer.  Feel patient safe for d/c home with family support and would recommend follow up PT at d/c.    Follow Up Recommendations  Follow surgeon's recommendation for DC plan and follow-up therapies;Supervision for mobility/OOB     Equipment Recommendations  None recommended by PT    Recommendations for Other Services       Precautions / Restrictions Precautions Precautions: Knee Restrictions Weight Bearing Restrictions: Yes LLE Weight Bearing: Weight bearing as tolerated    Mobility  Bed Mobility               General bed mobility comments: up in chair  Transfers Overall transfer level: Needs assistance Equipment used: Rolling walker (2 wheeled) Transfers: Sit to/from Stand Sit to Stand: Supervision         General transfer comment: up from recliner, also simulated car transfer to tall SUV and spouse assisted leg as needed  Ambulation/Gait Ambulation/Gait assistance: Supervision Ambulation Distance (Feet): 200 Feet Assistive device: Rolling walker (2 wheeled) Gait Pattern/deviations: Decreased stride length;Step-through pattern     General Gait Details: mild antalgia   Stairs Stairs: Yes   Stair Management: One rail Left;Step to pattern;Sideways;With cane;Forwards Number of Stairs: 2(x 4) General stair comments: practiced with rail sideways for home entry x 1 w/ PT, then with spouse assist, then forwards with cane and rail with PT, then spouse assist  Wheelchair Mobility    Modified Rankin (Stroke Patients Only)       Balance Overall balance assessment: Needs assistance   Sitting  balance-Leahy Scale: Good       Standing balance-Leahy Scale: Fair Standing balance comment: washed hands without UE support, toileted on her own for hygiene                            Cognition Arousal/Alertness: Awake/alert Behavior During Therapy: WFL for tasks assessed/performed Overall Cognitive Status: Within Functional Limits for tasks assessed                                        Exercises Total Joint Exercises Ankle Circles/Pumps: AROM;Both;10 reps;Seated Quad Sets: AROM;10 reps;Left;Seated Heel Slides: AROM;Left;10 reps;Seated Hip ABduction/ADduction: AROM;10 reps;Seated;Left Straight Leg Raises: Seated;10 reps;Left;AROM Goniometric ROM: -10 to 78    General Comments        Pertinent Vitals/Pain Pain Score: 5  Pain Location: L knee  Pain Descriptors / Indicators: Aching;Operative site guarding Pain Intervention(s): Monitored during session    Home Living                      Prior Function            PT Goals (current goals can now be found in the care plan section) Progress towards PT goals: Progressing toward goals    Frequency    7X/week      PT Plan Current plan remains appropriate    Co-evaluation              AM-PAC PT "6  Clicks" Daily Activity  Outcome Measure  Difficulty turning over in bed (including adjusting bedclothes, sheets and blankets)?: A Little Difficulty moving from lying on back to sitting on the side of the bed? : A Little Difficulty sitting down on and standing up from a chair with arms (e.g., wheelchair, bedside commode, etc,.)?: A Little Help needed moving to and from a bed to chair (including a wheelchair)?: A Little Help needed walking in hospital room?: A Little Help needed climbing 3-5 steps with a railing? : A Little 6 Click Score: 18    End of Session Equipment Utilized During Treatment: Gait belt Activity Tolerance: Patient tolerated treatment well Patient left:  with call bell/phone within reach;in chair;with family/visitor present   PT Visit Diagnosis: Other abnormalities of gait and mobility (R26.89);Pain Pain - Right/Left: Left Pain - part of body: Knee     Time: 3704-8889 PT Time Calculation (min) (ACUTE ONLY): 36 min  Charges:  $Gait Training: 8-22 mins $Therapeutic Exercise: 8-22 mins                    G CodesMagda Steele, [place] [phone] 12/07/2017    Nancy Steele 12/07/2017, 1:45 PM

## 2017-12-07 NOTE — Progress Notes (Signed)
    Subjective: Patient reports pain as mild to moderate, controlled with p.o. medicine.  Tolerating diet.  Urinating.  +Flatus.  No CP, SOB. OOB in room and with therapy.  Objective:   VITALS:   Vitals:   12/06/17 1500 12/06/17 2018 12/07/17 0136 12/07/17 0428  BP: 126/85 112/62 116/69 118/66  Pulse: 80 89 80 71  Resp: 18   20  Temp: 98.6 F (37 C)  98.4 F (36.9 C) 97.6 F (36.4 C)  TempSrc: Oral  Oral Oral  SpO2: 100% 100% 100% 95%  Weight:      Height:       CBC Latest Ref Rng & Units 11/26/2017 09/08/2017 08/25/2016  WBC 4.0 - 10.5 K/uL 5.5 4.9 4.0  Hemoglobin 12.0 - 15.0 g/dL 12.3 12.7 12.6  Hematocrit 36.0 - 46.0 % 36.5 37.4 38.0  Platelets 150 - 400 K/uL 217 240 219   BMP Latest Ref Rng & Units 11/26/2017 09/08/2017 08/25/2016  Glucose 65 - 99 mg/dL 89 106(H) 94  BUN 6 - 20 mg/dL 17 17 17   Creatinine 0.44 - 1.00 mg/dL 0.82 0.82 0.95  BUN/Creat Ratio 6 - 22 (calc) - NOT APPLICABLE -  Sodium 917 - 145 mmol/L 139 140 141  Potassium 3.5 - 5.1 mmol/L 3.9 5.0 4.3  Chloride 101 - 111 mmol/L 103 104 105  CO2 22 - 32 mmol/L 24 29 26   Calcium 8.9 - 10.3 mg/dL 9.3 9.6 9.7   Intake/Output      02/18 0701 - 02/19 0700   I.V. (mL/kg) 1641.7 (19.2)   IV Piggyback 50   Total Intake(mL/kg) 1691.7 (19.8)   Urine (mL/kg/hr) 1850 (0.9)   Blood 30   Total Output 1880   Net -188.3       Urine Occurrence 2 x      Physical Exam: General: NAD.  Upright in bed.  Calm, pleasant, conversant Resp: No increased wob Cardio: regular rate and rhythm ABD soft Neurologically intact MSK Neurovascularly intact Sensation intact distally Feet warm Dorsiflexion/Plantar flexion intact Incision: dressing C/D/I   Assessment: 1 Day Post-Op  S/P Procedure(s) (LRB): LEFT TOTAL KNEE ARTHROPLASTY (Left) by Dr. Noemi Chapel on 12/06/2017  Principal Problem:   Primary localized osteoarthritis of left knee Active Problems:   Hypertension   Primary osteoarthritis of knee   Primary  osteoarthritis left knee, status post total knee arthroplasty Doing well postop day 1. Labs pending. Eating, drinking, voiding, and mobilizing well in room and with therapy.  Plan: Advance diet Up with therapy D/C IV fluids Incentive Spirometry Elevate and Apply ice CPM and bone foam  Weight Bearing: Weight Bearing as Tolerated (WBAT) LLE Dressings: Aquacel.  VTE prophylaxis: Aspirin, SCDs, ambulation  Dispo: Home today after therapy    Prudencio Burly III, PA-C 12/07/2017, 6:30 AM

## 2017-12-08 DIAGNOSIS — M1712 Unilateral primary osteoarthritis, left knee: Secondary | ICD-10-CM | POA: Diagnosis not present

## 2017-12-08 DIAGNOSIS — Z96652 Presence of left artificial knee joint: Secondary | ICD-10-CM | POA: Diagnosis not present

## 2017-12-09 DIAGNOSIS — I10 Essential (primary) hypertension: Secondary | ICD-10-CM | POA: Diagnosis not present

## 2017-12-09 DIAGNOSIS — Z471 Aftercare following joint replacement surgery: Secondary | ICD-10-CM | POA: Diagnosis not present

## 2017-12-09 DIAGNOSIS — M5030 Other cervical disc degeneration, unspecified cervical region: Secondary | ICD-10-CM | POA: Diagnosis not present

## 2017-12-10 ENCOUNTER — Other Ambulatory Visit: Payer: Self-pay | Admitting: Physician Assistant

## 2017-12-10 DIAGNOSIS — M5030 Other cervical disc degeneration, unspecified cervical region: Secondary | ICD-10-CM | POA: Diagnosis not present

## 2017-12-10 DIAGNOSIS — Z471 Aftercare following joint replacement surgery: Secondary | ICD-10-CM | POA: Diagnosis not present

## 2017-12-10 DIAGNOSIS — M1712 Unilateral primary osteoarthritis, left knee: Secondary | ICD-10-CM

## 2017-12-10 DIAGNOSIS — I10 Essential (primary) hypertension: Secondary | ICD-10-CM | POA: Diagnosis not present

## 2017-12-13 DIAGNOSIS — Z471 Aftercare following joint replacement surgery: Secondary | ICD-10-CM | POA: Diagnosis not present

## 2017-12-13 DIAGNOSIS — M5030 Other cervical disc degeneration, unspecified cervical region: Secondary | ICD-10-CM | POA: Diagnosis not present

## 2017-12-13 DIAGNOSIS — I10 Essential (primary) hypertension: Secondary | ICD-10-CM | POA: Diagnosis not present

## 2017-12-14 DIAGNOSIS — Z471 Aftercare following joint replacement surgery: Secondary | ICD-10-CM | POA: Diagnosis not present

## 2017-12-14 DIAGNOSIS — I10 Essential (primary) hypertension: Secondary | ICD-10-CM | POA: Diagnosis not present

## 2017-12-14 DIAGNOSIS — M5030 Other cervical disc degeneration, unspecified cervical region: Secondary | ICD-10-CM | POA: Diagnosis not present

## 2017-12-15 DIAGNOSIS — I10 Essential (primary) hypertension: Secondary | ICD-10-CM | POA: Diagnosis not present

## 2017-12-15 DIAGNOSIS — Z471 Aftercare following joint replacement surgery: Secondary | ICD-10-CM | POA: Diagnosis not present

## 2017-12-15 DIAGNOSIS — M5030 Other cervical disc degeneration, unspecified cervical region: Secondary | ICD-10-CM | POA: Diagnosis not present

## 2017-12-17 DIAGNOSIS — I10 Essential (primary) hypertension: Secondary | ICD-10-CM | POA: Diagnosis not present

## 2017-12-17 DIAGNOSIS — M5030 Other cervical disc degeneration, unspecified cervical region: Secondary | ICD-10-CM | POA: Diagnosis not present

## 2017-12-17 DIAGNOSIS — Z471 Aftercare following joint replacement surgery: Secondary | ICD-10-CM | POA: Diagnosis not present

## 2017-12-20 DIAGNOSIS — M5030 Other cervical disc degeneration, unspecified cervical region: Secondary | ICD-10-CM | POA: Diagnosis not present

## 2017-12-20 DIAGNOSIS — I10 Essential (primary) hypertension: Secondary | ICD-10-CM | POA: Diagnosis not present

## 2017-12-20 DIAGNOSIS — Z471 Aftercare following joint replacement surgery: Secondary | ICD-10-CM | POA: Diagnosis not present

## 2017-12-20 DIAGNOSIS — M1712 Unilateral primary osteoarthritis, left knee: Secondary | ICD-10-CM | POA: Diagnosis not present

## 2017-12-21 ENCOUNTER — Other Ambulatory Visit: Payer: Self-pay

## 2017-12-21 ENCOUNTER — Ambulatory Visit (HOSPITAL_COMMUNITY): Payer: 59 | Attending: Physician Assistant | Admitting: Physical Therapy

## 2017-12-21 ENCOUNTER — Encounter (HOSPITAL_COMMUNITY): Payer: Self-pay | Admitting: Physical Therapy

## 2017-12-21 DIAGNOSIS — R2689 Other abnormalities of gait and mobility: Secondary | ICD-10-CM | POA: Diagnosis present

## 2017-12-21 DIAGNOSIS — M25562 Pain in left knee: Secondary | ICD-10-CM | POA: Insufficient documentation

## 2017-12-21 DIAGNOSIS — R29898 Other symptoms and signs involving the musculoskeletal system: Secondary | ICD-10-CM | POA: Diagnosis not present

## 2017-12-21 DIAGNOSIS — M6281 Muscle weakness (generalized): Secondary | ICD-10-CM | POA: Diagnosis not present

## 2017-12-21 NOTE — Therapy (Signed)
[place], [place], [address] Phone: [phone]   Fax:  [phone]  Physical Therapy Evaluation  Patient Details  Name: Nancy Steele MRN: [id number] Date of Birth: Jul 24, 1956 Referring Provider: Matthew Saras, PA-C   Encounter Date: 12/21/2017  PT End of Session - 12/21/17 2003    Visit Number  1    Number of Visits  17    Date for PT Re-Evaluation  01/18/18    Authorization Type  [employer]    Authorization Time Period  12/21/17 - 02/18/18    PT Start Time  1302    PT Stop Time  1344    PT Time Calculation (min)  42 min    Activity Tolerance  Patient tolerated treatment well    Behavior During Therapy  [facility] for tasks assessed/performed       Past Medical History:  Diagnosis Date  . Acute medial meniscus tear of left knee   . Allergy    seasonal  . Arthritis   . Blood transfusion without reported diagnosis    28 yrs ago  . Cataract    left eye-removal with implant   . DDD (degenerative disc disease), cervical   . Family history of adverse reaction to anesthesia    brother slow to wake up  . Glaucoma    glucoma like issue- cuased from past surgery increasing pressurein the eye   . Hypertension   . Primary localized osteoarthritis of left knee 11/24/2017    Past Surgical History:  Procedure Laterality Date  . CARPAL TUNNEL RELEASE     bilateral  . CERVICAL FUSION  2000  . CHONDROPLASTY Left 10/17/2014   Procedure: CHONDROPLASTY;  Surgeon: Lorn Junes, MD;  Location: [place];  Service: Orthopedics;  Laterality: Left;  . COLONOSCOPY    . KNEE ARTHROSCOPY     bilateral  . KNEE ARTHROSCOPY WITH LATERAL MENISECTOMY Left 10/17/2014   Procedure: KNEE ARTHROSCOPY WITH LATERAL MENISECTOMY;  Surgeon: Lorn Junes, MD;  Location: [place];  Service: Orthopedics;  Laterality: Left;  . KNEE ARTHROSCOPY WITH MEDIAL MENISECTOMY Left 10/17/2014   Procedure:  LEFT KNEE ARTHROSCOPY WITH MEDIAL MENISCECTOMY;  Surgeon: Lorn Junes, MD;  Location: [place];  Service: Orthopedics;  Laterality: Left;  . POLYPECTOMY    . TOTAL KNEE ARTHROPLASTY Left 12/06/2017   Procedure: LEFT TOTAL KNEE ARTHROPLASTY;  Surgeon: Elsie Saas, MD;  Location: [place];  Service: Orthopedics;  Laterality: Left;  . UPPER GI ENDOSCOPY     pt states she has never had an EGD in the past     There were no vitals filed for this visit.   Subjective Assessment - 12/21/17 1315    Subjective  Patient reported that she had a left total knee replacement on 12/06/17. Patient stated that she has been receiving physical therapy at home following her surgery. Patient stated that at first she ambulated with a rolling walker, but that now she is able to ambulate with a cane when out and has ambulated some at home without the cane. Patient reported that she does have some numbness on the lateral side of her left knee. She reported that standing is most uncomfortable. She reported a history of meniscus repairs bilaterally. Patient also stated she has been waking up at night every night due to pain and has not been able to return to bed without taking medication.     Pertinent History  S/P left TKA 12/06/17    Limitations  Standing;Sitting;Walking;House hold activities    How long can you sit comfortably?  1 hour    How long can you stand comfortably?  5-10 minutes    How long can you walk comfortably?  30 minutes    Diagnostic tests  12/06/17: Left knee "satisfactory left knee replacement"    Patient Stated Goals  To get her leg straight; and sleep on side with knees curled up     Currently in Pain?  Yes    Pain Score  3     Pain Location  Knee    Pain Orientation  Left    Pain Descriptors / Indicators  Aching    Pain Type  Surgical pain    Pain Onset  1 to 4 weeks ago    Pain Frequency  Constant    Aggravating Factors   Standing     Pain Relieving Factors  Ice, tylenol     Effect of Pain on Daily Activities  Moderately limiting    Multiple Pain Sites  No         [facility] PT Assessment - 12/21/17 0001      Assessment   Medical Diagnosis  Primary localized osteoarthritis of left knee; S/p left TKA    Referring Provider  Matthew Saras, PA-C    Onset Date/Surgical Date  12/06/17    Next MD Visit  01/18/18    Prior Therapy  Yes, home health PT      Restrictions   Weight Bearing Restrictions  Yes    LLE Weight Bearing  Weight bearing as tolerated      Balance Screen   Has the patient fallen in the past 6 months  No    Has the patient had a decrease in activity level because of a fear of falling?   Yes    Is the patient reluctant to leave their home because of a fear of falling?   No      Home Environment   Living Environment  Private residence    Living Arrangements  Spouse/significant other    Type of [place] to enter    Entrance Stairs-Number of Steps  5    Entrance Stairs-Rails  Cannot reach both;Right;Left    Home Layout  Two level    Alternate Level Stairs-Number of Steps  13 stairs    Alternate Level Stairs-Rails  Right;Left;Can reach both    [employer] - single point;Walker - 2 wheels;Crutches      Prior Function   Level of Independence  Independent    Vocation  Full time employment    Vocation Requirements  Lift about 25 pounds squat and pick up things and standing    Leisure  Walking and bike riding       Cognition   Overall Cognitive Status  Within Functional Limits for tasks assessed      Observation/Other Assessments   Observations  Patient's incision appeared clean and intact, no redness noted    Focus on Therapeutic Outcomes (FOTO)   34% (66% limited)      Circumferential Edema   Circumferential - Right  17.25 inches midpatellar over leggings    Circumferential - Left   18 inches midpatellar over leggings      Sensation   Light Touch  Impaired by gross assessment    Additional  Comments  Patient reported all areas of left lower leg  felt normal except on the lateral side of her left knee      AROM   AROM Assessment Site  Knee    Right/Left Knee  Left;Right    Right Knee Extension  0    Right Knee Flexion  124    Left Knee Extension  16 lacks 16 degrees from 0    Left Knee Flexion  87      Strength   Strength Assessment Site  Hip;Knee;Ankle    Right/Left Hip  Right;Left    Right Hip Flexion  5/5    Right Hip Extension  4+/5    Right Hip ABduction  5/5    Left Hip Flexion  4+/5    Left Hip Extension  3+/5    Left Hip ABduction  4/5    Right/Left Knee  Right;Left    Right Knee Flexion  5/5    Right Knee Extension  5/5    Left Knee Flexion  3/5    Left Knee Extension  4+/5    Right/Left Ankle  Right;Left    Right Ankle Dorsiflexion  5/5    Left Ankle Dorsiflexion  5/5      Palpation   Palpation comment  Patient reported tenderness around left knee superiorly, inferiorly, medially, and laterally      Ambulation/Gait   Ambulation/Gait  Yes    Ambulation/Gait Assistance  6: Modified independent (Device/Increase time)    Ambulation Distance (Feet)  604 Feet    Assistive device  Straight cane    Gait Pattern  Step-through pattern;Decreased stride length;Decreased step length - right;Decreased stance time - left;Decreased hip/knee flexion - left;Antalgic    Ambulation Surface  Level    Gait velocity  1.02 m/s    Stairs  Yes    Stairs Assistance  6: Modified independent (Device/Increase time)    Stair Management Technique  One rail Left;Step to pattern;With cane    Number of Stairs  13    Height of Stairs  6 inches    Gait Comments  Patient ascended and descended stairs with step to gait pattern      Static Standing Balance   Static Standing - Balance Support  No upper extremity supported    Static Standing - Level of Assistance  7: Independent    Static Standing Balance -  Activities   Single Leg Stance - Right Leg;Single Leg Stance - Left Leg     Static Standing - Comment/# of Minutes  SLS Rt.: 32 seconds; SLS Lt.: 18 seconds      Standardized Balance Assessment   Five times sit to stand comments   17.64 seconds             Objective measurements completed on examination: See above findings.              PT Education - 12/21/17 2002    Education provided  Yes    Education Details  Patient was educated on examination findings, plan of care, and continuing HEP given by home health physical therapist at this time.     Person(s) Educated  Patient    Methods  Explanation    Comprehension  Verbalized understanding       PT Short Term Goals - 12/21/17 2019      PT SHORT TERM GOAL #1   Title  Patient will demonstrate understanding and report regular compliance with HEP.     Time  4    Period  Weeks    Status  New    Target Date  01/18/18      PT SHORT TERM GOAL #2   Title  Patient will demonstrate left knee AROM extension/flexion of at least 5-95 degrees in order to assist patient with more normalized gait and stair ambulation.     Time  4    Period  Weeks    Status  New    Target Date  01/18/18      PT SHORT TERM GOAL #3   Title  Patient will demonstrate improvement of 1/2 MMT grade in all deficient tested musculature in order to assist patient in performing ambulation and stair ambulation with improved mechanics.     Time  4    Period  Weeks    Status  New    Target Date  01/18/18        PT Long Term Goals - 12/21/17 2023      PT LONG TERM GOAL #1   Title  Patient will demonstrate left knee AROM extension/flexion of at least 0-120 degrees in order to assist patient with ambulation, stairs, and squatting.     Time  8    Period  Weeks    Status  New    Target Date  02/15/18      PT LONG TERM GOAL #2   Title  Patient will demonstrate improvement of 1 MMT grade or be 5/5 in all deficient tested musculature in order to assist patient in performing ambulation and stair ambulation with improved  mechanics.     Time  8    Period  Weeks    Status  New    Target Date  02/15/18      PT LONG TERM GOAL #3   Title  Patient will report ability to sleep through the night for at least 3 nights over the course of 1 week without waking up and taking medication to get back to sleep.     Time  8    Period  Weeks    Status  New    Target Date  02/15/18      PT LONG TERM GOAL #4   Title  Patient will demonstrate ability to perform the 5 times sit to stand test in 11 seconds or less indicating improved balance and decreased risk of falls.     Time  8    Period  Weeks    Status  New    Target Date  02/15/18      PT LONG TERM GOAL #5   Title  Patient will demonstrate ability to perform ascending and descending 13 stairs with single handrail with reciprocal gait pattern for improved mechanics and navigation of stairs at patient's home.     Time  8    Period  Weeks    Status  New    Target Date  02/15/18      Additional Long Term Goals   Additional Long Term Goals  Yes      PT LONG TERM GOAL #6   Title  Patient will report ability to stand for 20 minutes to allow for patient to prepare a meal at home.     Time  8    Period  Weeks    Status  New    Target Date  02/15/18             Plan - 12/21/17 2045    Clinical Impression Statement  Patient reported to physical therapy for evaluation following left total  knee replacement on 12/06/17. Upon examination, patient demonstrated decreased left knee range of motion, and left lower extremity strength. Patient was limited in balance of the left lower extremity compared to the right, and patient demonstrated decreased balance during the five times sit to stand test. Circumferential measurement also suggested edema of the left lower extremity. With ambulation patient demonstrated decreased stance time on the left lower extremity and antalgic gait. In addition, patient demonstrated step-to rather than reciprocal gait pattern with stair  navigation. In addition, patient reported pain that keeps her up every night of the week. Patient's FOTO score was found to be 34%. Patient would benefit from skilled physical therapy in order to address the abovementioned deficits and improve patient's overall functional mobility and quality of life.     History and Personal Factors relevant to plan of care:  S/P Left TKA 12/06/17, Hypertension, left knee arthroscopy with medial menisectomy 2015, left knee arthroscopy with lateral menisectomy 2015    Clinical Presentation  Stable    Clinical Presentation due to:  ROM, MMT, 5xSTS, FOTO, 3MWT, clinical judgement    Clinical Decision Making  Low    Rehab Potential  Good    Clinical Impairments Affecting Rehab Potential  Positive: Motivated; Negative: history of left knee problems     PT Frequency  2x / week    PT Duration  8 weeks    PT Treatment/Interventions  ADLs/Self Care Home Management;Aquatic Therapy;Cryotherapy;Moist Heat;DME Instruction;Gait training;Stair training;Functional mobility training;Therapeutic activities;Therapeutic exercise;Balance training;Neuromuscular re-education;Patient/family education;Orthotic Fit/Training;Manual techniques;Scar mobilization;Passive range of motion;Energy conservation;Taping    PT Next Visit Plan  Review Evaluation and goals, review HEP and discuss which exercises patient should continue; manual therapy for edema reduction if edema is still present; begin aggressive left knee ROM; gait training; integrate functional strengthening as ROM improves    PT Home Exercise Plan  Patient instructed to continue HEP given by home health physical therapist, review at first treatment    Consulted and Agree with Plan of Care  Patient       Patient will benefit from skilled therapeutic intervention in order to improve the following deficits and impairments:  Abnormal gait, Decreased activity tolerance, Decreased endurance, Decreased range of motion, Decreased strength,  Hypomobility, Increased fascial restricitons, Impaired sensation, Pain, Improper body mechanics, Decreased balance, Decreased mobility, Decreased scar mobility, Difficulty walking, Increased edema, Impaired flexibility  Visit Diagnosis: Acute pain of left knee  Muscle weakness (generalized)  Other symptoms and signs involving the musculoskeletal system  Other abnormalities of gait and mobility     Problem List Patient Active Problem List   Diagnosis Date Noted  . Primary osteoarthritis of knee 12/06/2017  . Primary localized osteoarthritis of left knee 11/24/2017  . Acute medial meniscus tear of left knee   . Hypertension    Clarene Critchley PT, DPT 8:55 PM, 12/21/17 [place], [place], [address] Phone: [phone]   Fax:  [phone]  Name: Nancy Steele MRN: [id number] Date of Birth: Jul 22, 1956

## 2017-12-21 NOTE — Patient Instructions (Signed)
Patient was instructed to continue home exercises from home health physical therapist currently.

## 2017-12-23 ENCOUNTER — Ambulatory Visit (HOSPITAL_COMMUNITY): Payer: 59 | Admitting: Physical Therapy

## 2017-12-23 DIAGNOSIS — M6281 Muscle weakness (generalized): Secondary | ICD-10-CM

## 2017-12-23 DIAGNOSIS — M25562 Pain in left knee: Secondary | ICD-10-CM

## 2017-12-23 DIAGNOSIS — R29898 Other symptoms and signs involving the musculoskeletal system: Secondary | ICD-10-CM

## 2017-12-23 DIAGNOSIS — R2689 Other abnormalities of gait and mobility: Secondary | ICD-10-CM

## 2017-12-23 NOTE — Patient Instructions (Signed)
  QUAD SET WITH TOWEL UNDER HEEL While lying or sitting with a small towel roll under your ankle, tighten your top thigh muscle to press the back of your knee downward towards the ground. 2x 15 with 5 second holds Repeat 15 Times Hold 5 Seconds Complete 2 Sets Perform 1 Time(s) a Day   SHORT ARC QUAD - SAQ Place a rolled up towel or object under your knee and slowly straighten your knee as your raise up your foot. 2 x 15  Repeat 15 Times Hold 3 Seconds Complete 2 Sets Perform 1 Time(s) a Day    HEEL SLIDES - SUPINE Lying on your back with knees straight, slide the affected heel towards your buttock as you bend your knee. Hold a gentle stretch in this position and then return to original position.  Repeat 15 Times Hold 5 Seconds Complete 2 Sets Perform 1 Time(s) a Day    HEEL SLIDES - SELF ASSISTED While seated, slide your heel towards your buttock with the assist of the unaffected leg.  Repeat 15 Times Hold 3 Seconds Complete 2 Sets Perform 1 Time(s) a Day

## 2017-12-23 NOTE — Therapy (Signed)
[facility], [place], [address] Phone: [phone]   Fax:  [phone]  Physical Therapy Treatment  Patient Details  Name: Nancy Steele MRN: [id number] Date of Birth: 02-15-1956 Referring Provider: Matthew Saras, PA-C   Encounter Date: 12/23/2017  PT End of Session - 12/23/17 1512    Visit Number  2    Number of Visits  17    Date for PT Re-Evaluation  01/18/18    Authorization Type  [employer]    Authorization Time Period  12/21/17 - 02/18/18    PT Start Time  1303    PT Stop Time  1344    PT Time Calculation (min)  41 min    Activity Tolerance  Patient tolerated treatment well    Behavior During Therapy  [facility] for tasks assessed/performed       Past Medical History:  Diagnosis Date  . Acute medial meniscus tear of left knee   . Allergy    seasonal  . Arthritis   . Blood transfusion without reported diagnosis    28 yrs ago  . Cataract    left eye-removal with implant   . DDD (degenerative disc disease), cervical   . Family history of adverse reaction to anesthesia    brother slow to wake up  . Glaucoma    glucoma like issue- cuased from past surgery increasing pressurein the eye   . Hypertension   . Primary localized osteoarthritis of left knee 11/24/2017    Past Surgical History:  Procedure Laterality Date  . CARPAL TUNNEL RELEASE     bilateral  . CERVICAL FUSION  2000  . CHONDROPLASTY Left 10/17/2014   Procedure: CHONDROPLASTY;  Surgeon: Lorn Junes, MD;  Location: [place];  Service: Orthopedics;  Laterality: Left;  . COLONOSCOPY    . KNEE ARTHROSCOPY     bilateral  . KNEE ARTHROSCOPY WITH LATERAL MENISECTOMY Left 10/17/2014   Procedure: KNEE ARTHROSCOPY WITH LATERAL MENISECTOMY;  Surgeon: Lorn Junes, MD;  Location: [place];  Service: Orthopedics;  Laterality: Left;  . KNEE ARTHROSCOPY WITH MEDIAL MENISECTOMY Left 10/17/2014   Procedure:  LEFT KNEE ARTHROSCOPY WITH MEDIAL MENISCECTOMY;  Surgeon: Lorn Junes, MD;  Location: [place];  Service: Orthopedics;  Laterality: Left;  . POLYPECTOMY    . TOTAL KNEE ARTHROPLASTY Left 12/06/2017   Procedure: LEFT TOTAL KNEE ARTHROPLASTY;  Surgeon: Elsie Saas, MD;  Location: [place];  Service: Orthopedics;  Laterality: Left;  . UPPER GI ENDOSCOPY     pt states she has never had an EGD in the past     There were no vitals filed for this visit.  Subjective Assessment - 12/23/17 1304    Subjective  Patient reported she is having about a 2/10 pain. Patient reported she has been doing exercises.     Pertinent History  S/P left TKA 12/06/17    Limitations  Standing;Sitting;Walking;House hold activities    How long can you sit comfortably?  1 hour    How long can you stand comfortably?  5-10 minutes    How long can you walk comfortably?  30 minutes    Diagnostic tests  12/06/17: Left knee "satisfactory left knee replacement"    Patient Stated Goals  To get her leg straight; and sleep on side with knees curled up     Currently in Pain?  Yes    Pain Score  2     Pain  Location  Knee    Pain Orientation  Left    Pain Descriptors / Indicators  Aching    Pain Type  Surgical pain    Pain Onset  1 to 4 weeks ago    Multiple Pain Sites  No                      [facility] Adult PT Treatment/Exercise - 12/23/17 0001      Knee/Hip Exercises: Seated   Heel Slides  Left;AAROM;2 sets;15 reps;Limitations    Heel Slides Limitations  3 second holds      Knee/Hip Exercises: Supine   Quad Sets  AROM;Strengthening;Left;2 sets;15 reps    Quad Sets Limitations  Towel under heel. 5 second holds    Short [employer]  AROM;Strengthening;Left;2 sets;15 reps    Short [employer] Limitations  Over foam roll 3 second holds    Heel Slides  Left;AROM;2 sets;15 reps    Heel Slides Limitations  5 second holds    Knee Extension  AROM    Knee Extension Limitations  15 degrees     Knee Flexion  AROM    Knee Flexion Limitations  90 degrees      Manual Therapy   Manual Therapy  Edema management;Joint mobilization    Manual therapy comments  All manual therapy completed separately from the rest of skilled intervention    Edema Management  Performed supine with patient's lower extremities elevated retrograde massage to left lower extremity for edema reduction    Joint Mobilization  Gentle anterior to posterior and posterior to anterior glides to tibiofemoral joint grade II to patient's tolerance 1 minute total each direction             PT Education - 12/23/17 1510    Education provided  Yes    Education Details  This session reviewed patient's evaluation and goals, educated on HEP and gave handout, patient was educated on purpose and technique of interventions throughout.     Person(s) Educated  Patient    Methods  Explanation;Tactile cues;Verbal cues;Handout    Comprehension  Verbalized understanding;Returned demonstration       PT Short Term Goals - 12/21/17 2019      PT SHORT TERM GOAL #1   Title  Patient will demonstrate understanding and report regular compliance with HEP.     Time  4    Period  Weeks    Status  New    Target Date  01/18/18      PT SHORT TERM GOAL #2   Title  Patient will demonstrate left knee AROM extension/flexion of at least 5-95 degrees in order to assist patient with more normalized gait and stair ambulation.     Time  4    Period  Weeks    Status  New    Target Date  01/18/18      PT SHORT TERM GOAL #3   Title  Patient will demonstrate improvement of 1/2 MMT grade in all deficient tested musculature in order to assist patient in performing ambulation and stair ambulation with improved mechanics.     Time  4    Period  Weeks    Status  New    Target Date  01/18/18        PT Long Term Goals - 12/21/17 2023      PT LONG TERM GOAL #1   Title  Patient will demonstrate left knee AROM extension/flexion of at least 0-120  degrees in order to assist patient with ambulation, stairs, and squatting.     Time  8    Period  Weeks    Status  New    Target Date  02/15/18      PT LONG TERM GOAL #2   Title  Patient will demonstrate improvement of 1 MMT grade or be 5/5 in all deficient tested musculature in order to assist patient in performing ambulation and stair ambulation with improved mechanics.     Time  8    Period  Weeks    Status  New    Target Date  02/15/18      PT LONG TERM GOAL #3   Title  Patient will report ability to sleep through the night for at least 3 nights over the course of 1 week without waking up and taking medication to get back to sleep.     Time  8    Period  Weeks    Status  New    Target Date  02/15/18      PT LONG TERM GOAL #4   Title  Patient will demonstrate ability to perform the 5 times sit to stand test in 11 seconds or less indicating improved balance and decreased risk of falls.     Time  8    Period  Weeks    Status  New    Target Date  02/15/18      PT LONG TERM GOAL #5   Title  Patient will demonstrate ability to perform ascending and descending 13 stairs with single handrail with reciprocal gait pattern for improved mechanics and navigation of stairs at patient's home.     Time  8    Period  Weeks    Status  New    Target Date  02/15/18      Additional Long Term Goals   Additional Long Term Goals  Yes      PT LONG TERM GOAL #6   Title  Patient will report ability to stand for 20 minutes to allow for patient to prepare a meal at home.     Time  8    Period  Weeks    Status  New    Target Date  02/15/18            Plan - 12/23/17 1513    Clinical Impression Statement  This session began with a review of patient's evaluation and goals. Then performed manual therapy for edema management and for joint mobility to patient's tolerance, with manual therapy totaling 10 minutes. Then session progressed with patient performing exercises with a focus on ROM  improvement. Patient did not bring list of home exercises provided by home health therapist, so therapist provided patient with a handout of the most important exercises to be performing at this time. Patient reported understanding and said that they were similar to what she has been doing. Patient demonstrated some improvement in both knee extension range of motion and knee flexion range of motion this session. Patient would benefit from continued skilled physical therapy in order to continue addressing patient's deficits in ROM, strength, and overall functional mobility.     History and Personal Factors relevant to plan of care:  S/P Left TKA 12/06/17, hypertension, left knee arthroscopy with medial menisectomy 2015, left knee arthroscopy with lateral menisectomy 2015    Rehab Potential  Good    Clinical Impairments Affecting Rehab Potential  Positive: Motivated; Negative: history of left knee problems  PT Frequency  2x / week    PT Duration  8 weeks    PT Treatment/Interventions  ADLs/Self Care Home Management;Aquatic Therapy;Cryotherapy;Moist Heat;DME Instruction;Gait training;Stair training;Functional mobility training;Therapeutic activities;Therapeutic exercise;Balance training;Neuromuscular re-education;Patient/family education;Orthotic Fit/Training;Manual techniques;Scar mobilization;Passive range of motion;Energy conservation;Taping    PT Next Visit Plan  Manual therapy for edema reduction if edema is still present; progress ROM exercises for left knee following protocol; such as TKE, bike without resistance; gastroc stretch; gait training    PT Home Exercise Plan  12/23/17: Quad set with towel under heel 2 x 15 with 5 second hold 1x/day; SAQ 2 x 15 repetitions with 3 second holds 1x/day; Heel slide supine 2 x 15 5 second holds 1x/day;  Seated self-assisted heel slide 2 x 15 3 second holds 1x/week    Consulted and Agree with Plan of Care  Patient       Patient will benefit from skilled  therapeutic intervention in order to improve the following deficits and impairments:  Abnormal gait, Decreased activity tolerance, Decreased endurance, Decreased range of motion, Decreased strength, Hypomobility, Increased fascial restricitons, Impaired sensation, Pain, Improper body mechanics, Decreased balance, Decreased mobility, Decreased scar mobility, Difficulty walking, Increased edema, Impaired flexibility  Visit Diagnosis: Acute pain of left knee  Muscle weakness (generalized)  Other symptoms and signs involving the musculoskeletal system  Other abnormalities of gait and mobility     Problem List Patient Active Problem List   Diagnosis Date Noted  . Primary osteoarthritis of knee 12/06/2017  . Primary localized osteoarthritis of left knee 11/24/2017  . Acute medial meniscus tear of left knee   . Hypertension    Clarene Critchley PT, DPT 3:42 PM, 12/23/17 [place] [address] [place], [place], [address] Phone: [phone]   Fax:  [phone]  Name: ANNAMARIA SALAH MRN: [id number] Date of Birth: Dec 12, 1955

## 2017-12-28 ENCOUNTER — Encounter (HOSPITAL_COMMUNITY): Payer: Self-pay

## 2017-12-28 ENCOUNTER — Ambulatory Visit (HOSPITAL_COMMUNITY): Payer: 59

## 2017-12-28 ENCOUNTER — Other Ambulatory Visit: Payer: Self-pay

## 2017-12-28 DIAGNOSIS — R29898 Other symptoms and signs involving the musculoskeletal system: Secondary | ICD-10-CM

## 2017-12-28 DIAGNOSIS — M25562 Pain in left knee: Secondary | ICD-10-CM

## 2017-12-28 DIAGNOSIS — R2689 Other abnormalities of gait and mobility: Secondary | ICD-10-CM

## 2017-12-28 DIAGNOSIS — M6281 Muscle weakness (generalized): Secondary | ICD-10-CM

## 2017-12-28 NOTE — Patient Instructions (Signed)
    SHORT ARC QUAD  - SAQ : 1-2 times 15-20 repetitions, use a basketball or soccer ball under your knee, hold for 3 seconds  Place a rolled up towel or object under your knee and slowly straighten your knee as your raise up  your foot.

## 2017-12-28 NOTE — Therapy (Signed)
[facility] [place], [place], [address] Phone: [phone]   Fax:  [phone]  Physical Therapy Treatment  Patient Details  Name: Nancy Steele MRN: [id number] Date of Birth: 01/10/1956 Referring Provider: Matthew Saras, PA-C   Encounter Date: 12/28/2017  PT End of Session - 12/28/17 1106    Visit Number  3    Number of Visits  17    Date for PT Re-Evaluation  01/18/18    Authorization Type  [employer]    Authorization Time Period  12/21/17 - 02/18/18    PT Start Time  1033    PT Stop Time  1115    PT Time Calculation (min)  42 min    Activity Tolerance  Patient tolerated treatment well    Behavior During Therapy  [facility] for tasks assessed/performed       Past Medical History:  Diagnosis Date  . Acute medial meniscus tear of left knee   . Allergy    seasonal  . Arthritis   . Blood transfusion without reported diagnosis    28 yrs ago  . Cataract    left eye-removal with implant   . DDD (degenerative disc disease), cervical   . Family history of adverse reaction to anesthesia    brother slow to wake up  . Glaucoma    glucoma like issue- cuased from past surgery increasing pressurein the eye   . Hypertension   . Primary localized osteoarthritis of left knee 11/24/2017    Past Surgical History:  Procedure Laterality Date  . CARPAL TUNNEL RELEASE     bilateral  . CERVICAL FUSION  2000  . CHONDROPLASTY Left 10/17/2014   Procedure: CHONDROPLASTY;  Surgeon: Lorn Junes, MD;  Location: [place];  Service: Orthopedics;  Laterality: Left;  . COLONOSCOPY    . KNEE ARTHROSCOPY     bilateral  . KNEE ARTHROSCOPY WITH LATERAL MENISECTOMY Left 10/17/2014   Procedure: KNEE ARTHROSCOPY WITH LATERAL MENISECTOMY;  Surgeon: Lorn Junes, MD;  Location: [place];  Service: Orthopedics;  Laterality: Left;  . KNEE ARTHROSCOPY WITH MEDIAL MENISECTOMY Left 10/17/2014   Procedure:  LEFT KNEE ARTHROSCOPY WITH MEDIAL MENISCECTOMY;  Surgeon: Lorn Junes, MD;  Location: [place];  Service: Orthopedics;  Laterality: Left;  . POLYPECTOMY    . TOTAL KNEE ARTHROPLASTY Left 12/06/2017   Procedure: LEFT TOTAL KNEE ARTHROPLASTY;  Surgeon: Elsie Saas, MD;  Location: [place];  Service: Orthopedics;  Laterality: Left;  . UPPER GI ENDOSCOPY     pt states she has never had an EGD in the past     There were no vitals filed for this visit.  Subjective Assessment - 12/28/17 1103    Subjective  Patient reports she feels ok and is taking her pain medicine before therapy and before bed. She reports she is a little stiff because it is an earlier appointment.     Pertinent History  S/P left TKA 12/06/17    Limitations  Standing;Sitting;Walking;House hold activities    How long can you sit comfortably?  1 hour    How long can you stand comfortably?  5-10 minutes    How long can you walk comfortably?  30 minutes    Diagnostic tests  12/06/17: Left knee "satisfactory left knee replacement"    Patient Stated Goals  To get her leg straight; and sleep on side with knees curled up     Currently in Pain?  Yes    Pain Score  2     Pain Location  Knee    Pain Orientation  Left    Pain Descriptors / Indicators  Aching    Pain Type  Surgical pain    Pain Onset  1 to 4 weeks ago    Pain Frequency  Constant    Aggravating Factors   walking, standing    Pain Relieving Factors  ice, tylenol    Effect of Pain on Daily Activities  moderate         [facility] Adult PT Treatment/Exercise - 12/28/17 0001      Knee/Hip Exercises: Stretches   Passive Hamstring Stretch  2 reps;Left;30 seconds;Limitations    Passive Hamstring Stretch Limitations  12" box    Knee: Self-Stretch to increase Flexion  5 reps;10 seconds;Left;Limitations    Knee: Self-Stretch Limitations  12" box      Knee/Hip Exercises: Supine   Short Arc Quad Sets  AROM;Strengthening;Left;2 sets;20 reps    Short Arc Quad  Sets Limitations  over basketball; 3 second holds    Heel Slides  2 sets;20 reps;Left;AROM    Heel Slides Limitations  5 second holds    Knee Extension Limitations  8    Knee Flexion Limitations  100      Manual Therapy   Manual Therapy  Edema management;Joint mobilization    Manual therapy comments  All manual therapy completed separately from the rest of skilled intervention    Edema Management  Performed supine with patient's lower extremities elevated retrograde massage to left lower extremity for edema reduction    Joint Mobilization  3x 30-45 seconds for patellofemoral glides in all 4 directions grade III, AP/PA gildes grade III to tibiofemoral joint to improve ROM         PT Education - 12/28/17 1104    Education provided  Yes    Education Details  Educated on ice and elevation, to reduce swelling and on exercise throuhgout session. Educated on new HEP.     Person(s) Educated  Patient    Methods  Explanation;Handout    Comprehension  Verbalized understanding       PT Short Term Goals - 12/21/17 2019      PT SHORT TERM GOAL #1   Title  Patient will demonstrate understanding and report regular compliance with HEP.     Time  4    Period  Weeks    Status  New    Target Date  01/18/18      PT SHORT TERM GOAL #2   Title  Patient will demonstrate left knee AROM extension/flexion of at least 5-95 degrees in order to assist patient with more normalized gait and stair ambulation.     Time  4    Period  Weeks    Status  New    Target Date  01/18/18      PT SHORT TERM GOAL #3   Title  Patient will demonstrate improvement of 1/2 MMT grade in all deficient tested musculature in order to assist patient in performing ambulation and stair ambulation with improved mechanics.     Time  4    Period  Weeks    Status  New    Target Date  01/18/18        PT Long Term Goals - 12/21/17 2023      PT LONG TERM GOAL #1   Title  Patient will demonstrate left knee AROM  extension/flexion of at least 0-120 degrees  in order to assist patient with ambulation, stairs, and squatting.     Time  8    Period  Weeks    Status  New    Target Date  02/15/18      PT LONG TERM GOAL #2   Title  Patient will demonstrate improvement of 1 MMT grade or be 5/5 in all deficient tested musculature in order to assist patient in performing ambulation and stair ambulation with improved mechanics.     Time  8    Period  Weeks    Status  New    Target Date  02/15/18      PT LONG TERM GOAL #3   Title  Patient will report ability to sleep through the night for at least 3 nights over the course of 1 week without waking up and taking medication to get back to sleep.     Time  8    Period  Weeks    Status  New    Target Date  02/15/18      PT LONG TERM GOAL #4   Title  Patient will demonstrate ability to perform the 5 times sit to stand test in 11 seconds or less indicating improved balance and decreased risk of falls.     Time  8    Period  Weeks    Status  New    Target Date  02/15/18      PT LONG TERM GOAL #5   Title  Patient will demonstrate ability to perform ascending and descending 13 stairs with single handrail with reciprocal gait pattern for improved mechanics and navigation of stairs at patient's home.     Time  8    Period  Weeks    Status  New    Target Date  02/15/18      Additional Long Term Goals   Additional Long Term Goals  Yes      PT LONG TERM GOAL #6   Title  Patient will report ability to stand for 20 minutes to allow for patient to prepare a meal at home.     Time  8    Period  Weeks    Status  New    Target Date  02/15/18         Plan - 12/28/17 1107    Clinical Impression Statement  Patient is progressing well in therapy and her ROM has improved to 8-100 degrees on her left knee. Session focused on manual therapy for edema and joint mobility and exercise for ROM. She progressed quad strengthening to SAQ and her HEP was updated to include  this. She was educated on optimal elevation and ice technique to manage edema. Patient reported some soreness at end of session but denied an increase in pain. She will continue to benefit from skilled PT to address impairments and progress towards goals to improve function.    Rehab Potential  Good    Clinical Impairments Affecting Rehab Potential  Positive: Motivated; Negative: history of left knee problems     PT Frequency  2x / week    PT Duration  8 weeks    PT Treatment/Interventions  ADLs/Self Care Home Management;Aquatic Therapy;Cryotherapy;Moist Heat;DME Instruction;Gait training;Stair training;Functional mobility training;Therapeutic activities;Therapeutic exercise;Balance training;Neuromuscular re-education;Patient/family education;Orthotic Fit/Training;Manual techniques;Scar mobilization;Passive range of motion;Energy conservation;Taping    PT Next Visit Plan  Continue with manual therapy for edema reduction if edema is still present; initiate scar mobility when fully healed; progress ROM exercises for left knee  following protocol; such as TKE, bike without resistance; gastroc stretch; gait training    PT Home Exercise Plan  12/23/17: Quad set with towel under heel 2 x 15 with 5 second hold 1x/day; SAQ 2 x 15 repetitions with 3 second holds 1x/day; Heel slide supine 2 x 15 5 second holds 1x/day;  Seated self-assisted heel slide 2 x 15 3 second holds 1x/week; 12/28/17 - SAQ with basketball    Consulted and Agree with Plan of Care  Patient       Patient will benefit from skilled therapeutic intervention in order to improve the following deficits and impairments:  Abnormal gait, Decreased activity tolerance, Decreased endurance, Decreased range of motion, Decreased strength, Hypomobility, Increased fascial restricitons, Impaired sensation, Pain, Improper body mechanics, Decreased balance, Decreased mobility, Decreased scar mobility, Difficulty walking, Increased edema, Impaired flexibility  Visit  Diagnosis: Acute pain of left knee  Muscle weakness (generalized)  Other symptoms and signs involving the musculoskeletal system  Other abnormalities of gait and mobility     Problem List Patient Active Problem List   Diagnosis Date Noted  . Primary osteoarthritis of knee 12/06/2017  . Primary localized osteoarthritis of left knee 11/24/2017  . Acute medial meniscus tear of left knee   . Hypertension      Kipp Brood, PT, DPT Physical Therapist with [facility]  12/28/2017 11:17 AM    [facility] [facility] [address] [place], [place], [address] Phone: [phone]   Fax:  [phone]  Name: JAIRY ANGULO MRN: [id number] Date of Birth: 10-Sep-1956

## 2017-12-30 ENCOUNTER — Encounter (HOSPITAL_COMMUNITY): Payer: Self-pay | Admitting: Physical Therapy

## 2017-12-30 ENCOUNTER — Ambulatory Visit (HOSPITAL_COMMUNITY): Payer: 59 | Admitting: Physical Therapy

## 2017-12-30 DIAGNOSIS — M25562 Pain in left knee: Secondary | ICD-10-CM

## 2017-12-30 DIAGNOSIS — R2689 Other abnormalities of gait and mobility: Secondary | ICD-10-CM

## 2017-12-30 DIAGNOSIS — R29898 Other symptoms and signs involving the musculoskeletal system: Secondary | ICD-10-CM

## 2017-12-30 DIAGNOSIS — M6281 Muscle weakness (generalized): Secondary | ICD-10-CM

## 2017-12-30 NOTE — Therapy (Signed)
[place], [place], [address] Phone: [phone]   Fax:  [phone]  Physical Therapy Treatment  Patient Details  Name: Nancy Steele MRN: [id number] Date of Birth: 1956-03-09 Referring Provider: Matthew Saras, PA-C   Encounter Date: 12/30/2017  PT End of Session - 12/30/17 1438    Visit Number  4    Number of Visits  17    Date for PT Re-Evaluation  01/18/18    Authorization Type  [employer]    Authorization Time Period  12/21/17 - 02/18/18    PT Start Time  1303    PT Stop Time  1344    PT Time Calculation (min)  41 min    Activity Tolerance  Patient tolerated treatment well    Behavior During Therapy  [facility] for tasks assessed/performed       Past Medical History:  Diagnosis Date  . Acute medial meniscus tear of left knee   . Allergy    seasonal  . Arthritis   . Blood transfusion without reported diagnosis    28 yrs ago  . Cataract    left eye-removal with implant   . DDD (degenerative disc disease), cervical   . Family history of adverse reaction to anesthesia    brother slow to wake up  . Glaucoma    glucoma like issue- cuased from past surgery increasing pressurein the eye   . Hypertension   . Primary localized osteoarthritis of left knee 11/24/2017    Past Surgical History:  Procedure Laterality Date  . CARPAL TUNNEL RELEASE     bilateral  . CERVICAL FUSION  2000  . CHONDROPLASTY Left 10/17/2014   Procedure: CHONDROPLASTY;  Surgeon: Lorn Junes, MD;  Location: [place];  Service: Orthopedics;  Laterality: Left;  . COLONOSCOPY    . KNEE ARTHROSCOPY     bilateral  . KNEE ARTHROSCOPY WITH LATERAL MENISECTOMY Left 10/17/2014   Procedure: KNEE ARTHROSCOPY WITH LATERAL MENISECTOMY;  Surgeon: Lorn Junes, MD;  Location: [place];  Service: Orthopedics;  Laterality: Left;  . KNEE ARTHROSCOPY WITH MEDIAL MENISECTOMY Left 10/17/2014   Procedure:  LEFT KNEE ARTHROSCOPY WITH MEDIAL MENISCECTOMY;  Surgeon: Lorn Junes, MD;  Location: [place];  Service: Orthopedics;  Laterality: Left;  . POLYPECTOMY    . TOTAL KNEE ARTHROPLASTY Left 12/06/2017   Procedure: LEFT TOTAL KNEE ARTHROPLASTY;  Surgeon: Elsie Saas, MD;  Location: [place];  Service: Orthopedics;  Laterality: Left;  . UPPER GI ENDOSCOPY     pt states she has never had an EGD in the past     There were no vitals filed for this visit.  Subjective Assessment - 12/30/17 1306    Subjective  Patient reported she is feeling okay and that the pain is minimal today. She stated she's been doing her home exercises.     Pertinent History  S/P left TKA 12/06/17    Limitations  Standing;Sitting;Walking;House hold activities    How long can you sit comfortably?  1 hour    How long can you stand comfortably?  5-10 minutes    How long can you walk comfortably?  30 minutes    Diagnostic tests  12/06/17: Left knee "satisfactory left knee replacement"    Patient Stated Goals  To get her leg straight; and sleep on side with knees curled up     Currently in Pain?  Yes    Pain Score  2  Pain Location  Knee    Pain Orientation  Left    Pain Descriptors / Indicators  Aching    Pain Type  Surgical pain    Pain Onset  1 to 4 weeks ago    Multiple Pain Sites  No                      [facility] Adult PT Treatment/Exercise - 12/30/17 0001      Knee/Hip Exercises: Stretches   Passive Hamstring Stretch  3 reps;30 seconds    Passive Hamstring Stretch Limitations  12" box    Knee: Self-Stretch to increase Flexion  10 seconds;Left;Limitations;Other (comment) 10 repetitions    Knee: Self-Stretch Limitations  12" box    Gastroc Stretch  Left;3 reps;30 seconds;Limitations    Gastroc Stretch Limitations  On slant board       Knee/Hip Exercises: Aerobic   Stationary Bike  4 minutes full revolutions on bike level 1 seat 16 for range of motion      Knee/Hip Exercises:  Standing   Terminal Knee Extension  Strengthening;AROM;Left;1 set;10 reps;Theraband;Limitations    Theraband Level (Terminal Knee Extension)  Level 3 (Green)    Terminal Knee Extension Limitations  10 second holds    Functional Squat  1 set;10 reps;Limitations    Functional Squat Limitations  Quarter squat with       Knee/Hip Exercises: Supine   Short Arc Quad Sets  AROM;Strengthening;Left;2 sets;10 reps    Short Arc Quad Sets Limitations  Over basketball x 3 second holds    Heel Slides  2 sets;10 reps    Heel Slides Limitations  5 second holds    Knee Extension Limitations  6    Knee Flexion Limitations  101      Knee/Hip Exercises: Prone   Hamstring Curl  10 reps    Prone Knee Hang  2 minutes      Manual Therapy   Manual Therapy  Edema management;Joint mobilization    Manual therapy comments  All manual therapy completed separately from the rest of skilled intervention    Edema Management  Performed supine with patient's lower extremities elevated retrograde massage to left lower extremity for edema reduction    Joint Mobilization  3x 30-45 seconds for patellofemoral glides in all 4 directions grade III, AP/PA gildes grade III to tibiofemoral joint to improve ROM             PT Education - 12/30/17 1313    Education provided  Yes    Education Details  Educated patient on purpose and technique with exercises throughout session.     Person(s) Educated  Patient    Methods  Explanation;Verbal cues;Tactile cues    Comprehension  Verbalized understanding;Returned demonstration       PT Short Term Goals - 12/21/17 2019      PT SHORT TERM GOAL #1   Title  Patient will demonstrate understanding and report regular compliance with HEP.     Time  4    Period  Weeks    Status  New    Target Date  01/18/18      PT SHORT TERM GOAL #2   Title  Patient will demonstrate left knee AROM extension/flexion of at least 5-95 degrees in order to assist patient with more normalized gait  and stair ambulation.     Time  4    Period  Weeks    Status  New    Target Date  01/18/18  PT SHORT TERM GOAL #3   Title  Patient will demonstrate improvement of 1/2 MMT grade in all deficient tested musculature in order to assist patient in performing ambulation and stair ambulation with improved mechanics.     Time  4    Period  Weeks    Status  New    Target Date  01/18/18        PT Long Term Goals - 12/21/17 2023      PT LONG TERM GOAL #1   Title  Patient will demonstrate left knee AROM extension/flexion of at least 0-120 degrees in order to assist patient with ambulation, stairs, and squatting.     Time  8    Period  Weeks    Status  New    Target Date  02/15/18      PT LONG TERM GOAL #2   Title  Patient will demonstrate improvement of 1 MMT grade or be 5/5 in all deficient tested musculature in order to assist patient in performing ambulation and stair ambulation with improved mechanics.     Time  8    Period  Weeks    Status  New    Target Date  02/15/18      PT LONG TERM GOAL #3   Title  Patient will report ability to sleep through the night for at least 3 nights over the course of 1 week without waking up and taking medication to get back to sleep.     Time  8    Period  Weeks    Status  New    Target Date  02/15/18      PT LONG TERM GOAL #4   Title  Patient will demonstrate ability to perform the 5 times sit to stand test in 11 seconds or less indicating improved balance and decreased risk of falls.     Time  8    Period  Weeks    Status  New    Target Date  02/15/18      PT LONG TERM GOAL #5   Title  Patient will demonstrate ability to perform ascending and descending 13 stairs with single handrail with reciprocal gait pattern for improved mechanics and navigation of stairs at patient's home.     Time  8    Period  Weeks    Status  New    Target Date  02/15/18      Additional Long Term Goals   Additional Long Term Goals  Yes      PT LONG TERM  GOAL #6   Title  Patient will report ability to stand for 20 minutes to allow for patient to prepare a meal at home.     Time  8    Period  Weeks    Status  New    Target Date  02/15/18            Plan - 12/30/17 1441    Clinical Impression Statement  This session continued with a focus on improving patient's left knee range of motion. Session began with patient performing the bicycle for improved range of motion. Then session progressed to patient performing standing stretches followed by standing strengthening exercises. This session added quarter squats which patient tolerated well. Then session continued with patient performing exercises on the mat for improved range of motion. Finally, session ended with manual therapy to patient's left knee for 10 minutes total. Patient reported that pain level was about the same as when  starting session. Patient would benefit from continued skilled physical therapy to continue addressing deficits in range of motion, strength, and overall functional mobility.     Rehab Potential  Good    Clinical Impairments Affecting Rehab Potential  Positive: Motivated; Negative: history of left knee problems     PT Frequency  2x / week    PT Duration  8 weeks    PT Treatment/Interventions  ADLs/Self Care Home Management;Aquatic Therapy;Cryotherapy;Moist Heat;DME Instruction;Gait training;Stair training;Functional mobility training;Therapeutic activities;Therapeutic exercise;Balance training;Neuromuscular re-education;Patient/family education;Orthotic Fit/Training;Manual techniques;Scar mobilization;Passive range of motion;Energy conservation;Taping    PT Next Visit Plan  Continue with manual therapy for edema reduction if edema is still present; initiate scar mobility when fully healed; progress ROM exercises for left knee following protocol; such as TKE, bike without resistance; gastroc stretch; gait training    PT Home Exercise Plan  12/23/17: Quad set with towel under  heel 2 x 15 with 5 second hold 1x/day; SAQ 2 x 15 repetitions with 3 second holds 1x/day; Heel slide supine 2 x 15 5 second holds 1x/day;  Seated self-assisted heel slide 2 x 15 3 second holds 1x/week; 12/28/17 - SAQ with basketball    Consulted and Agree with Plan of Care  Patient       Patient will benefit from skilled therapeutic intervention in order to improve the following deficits and impairments:  Abnormal gait, Decreased activity tolerance, Decreased endurance, Decreased range of motion, Decreased strength, Hypomobility, Increased fascial restricitons, Impaired sensation, Pain, Improper body mechanics, Decreased balance, Decreased mobility, Decreased scar mobility, Difficulty walking, Increased edema, Impaired flexibility  Visit Diagnosis: Acute pain of left knee  Muscle weakness (generalized)  Other symptoms and signs involving the musculoskeletal system  Other abnormalities of gait and mobility     Problem List Patient Active Problem List   Diagnosis Date Noted  . Primary osteoarthritis of knee 12/06/2017  . Primary localized osteoarthritis of left knee 11/24/2017  . Acute medial meniscus tear of left knee   . Hypertension    Clarene Critchley PT, DPT 2:43 PM, 12/30/17 [place], [place], [address] Phone: [phone]   Fax:  [phone]  Name: ARMONI KLUDT MRN: [id number] Date of Birth: 02-27-1956

## 2018-01-04 ENCOUNTER — Encounter (HOSPITAL_COMMUNITY): Payer: Self-pay | Admitting: Physical Therapy

## 2018-01-04 ENCOUNTER — Ambulatory Visit (HOSPITAL_COMMUNITY): Payer: 59 | Admitting: Physical Therapy

## 2018-01-04 DIAGNOSIS — M25562 Pain in left knee: Secondary | ICD-10-CM | POA: Diagnosis not present

## 2018-01-04 DIAGNOSIS — M6281 Muscle weakness (generalized): Secondary | ICD-10-CM

## 2018-01-04 DIAGNOSIS — R2689 Other abnormalities of gait and mobility: Secondary | ICD-10-CM

## 2018-01-04 DIAGNOSIS — R29898 Other symptoms and signs involving the musculoskeletal system: Secondary | ICD-10-CM

## 2018-01-04 NOTE — Therapy (Signed)
[place], [place], [address] Phone: [phone]   Fax:  [phone]  Physical Therapy Treatment  Patient Details  Name: Nancy Steele MRN: [id number] Date of Birth: Sep 16, 1956 Referring Provider: Matthew Saras, PA-C   Encounter Date: 01/04/2018  PT End of Session - 01/04/18 1332    Visit Number  5    Number of Visits  17    Date for PT Re-Evaluation  01/18/18    Authorization Type  [employer]    Authorization Time Period  12/21/17 - 02/18/18    PT Start Time  1301    PT Stop Time  1345    PT Time Calculation (min)  44 min    Activity Tolerance  Patient tolerated treatment well    Behavior During Therapy  [facility] for tasks assessed/performed       Past Medical History:  Diagnosis Date  . Acute medial meniscus tear of left knee   . Allergy    seasonal  . Arthritis   . Blood transfusion without reported diagnosis    28 yrs ago  . Cataract    left eye-removal with implant   . DDD (degenerative disc disease), cervical   . Family history of adverse reaction to anesthesia    brother slow to wake up  . Glaucoma    glucoma like issue- cuased from past surgery increasing pressurein the eye   . Hypertension   . Primary localized osteoarthritis of left knee 11/24/2017    Past Surgical History:  Procedure Laterality Date  . CARPAL TUNNEL RELEASE     bilateral  . CERVICAL FUSION  2000  . CHONDROPLASTY Left 10/17/2014   Procedure: CHONDROPLASTY;  Surgeon: Lorn Junes, MD;  Location: [place];  Service: Orthopedics;  Laterality: Left;  . COLONOSCOPY    . KNEE ARTHROSCOPY     bilateral  . KNEE ARTHROSCOPY WITH LATERAL MENISECTOMY Left 10/17/2014   Procedure: KNEE ARTHROSCOPY WITH LATERAL MENISECTOMY;  Surgeon: Lorn Junes, MD;  Location: [place];  Service: Orthopedics;  Laterality: Left;  . KNEE ARTHROSCOPY WITH MEDIAL MENISECTOMY Left 10/17/2014   Procedure:  LEFT KNEE ARTHROSCOPY WITH MEDIAL MENISCECTOMY;  Surgeon: Lorn Junes, MD;  Location: [place];  Service: Orthopedics;  Laterality: Left;  . POLYPECTOMY    . TOTAL KNEE ARTHROPLASTY Left 12/06/2017   Procedure: LEFT TOTAL KNEE ARTHROPLASTY;  Surgeon: Elsie Saas, MD;  Location: [place];  Service: Orthopedics;  Laterality: Left;  . UPPER GI ENDOSCOPY     pt states she has never had an EGD in the past     There were no vitals filed for this visit.  Subjective Assessment - 01/04/18 1304    Subjective  Patient stated that she isn't having any pain currently. Patient reported that she has been doing well with her exercises, but that she has to ice it due to the swelling.     Pertinent History  S/P left TKA 12/06/17    Limitations  Standing;Sitting;Walking;House hold activities    How long can you sit comfortably?  1 hour    How long can you stand comfortably?  5-10 minutes    How long can you walk comfortably?  30 minutes    Diagnostic tests  12/06/17: Left knee "satisfactory left knee replacement"    Patient Stated Goals  To get her leg straight; and sleep on side with knees curled up     Currently in  Pain?  No/denies                      [facility] Adult PT Treatment/Exercise - 01/04/18 0001      Knee/Hip Exercises: Stretches   Passive Hamstring Stretch  3 reps;30 seconds    Passive Hamstring Stretch Limitations  12" box    Knee: Self-Stretch to increase Flexion  10 seconds;Left;Limitations;Other (comment) 10 repetitions    Knee: Self-Stretch Limitations  12" box    Gastroc Stretch  Both;30 seconds;3 reps    Gastroc Stretch Limitations  On slant board       Knee/Hip Exercises: Aerobic   Stationary Bike  4 minutes full revolutions on bike level 1 seat 15 for range of motion      Knee/Hip Exercises: Standing   Heel Raises  Both;2 sets;10 reps    Knee Flexion  AROM;Strengthening;Left;Other (comment);10 reps;1 set Standing hamstring curls    Terminal Knee  Extension  Strengthening;AROM;Left;1 set;10 reps;Theraband;Limitations    Theraband Level (Terminal Knee Extension)  Level 4 (Blue)    Terminal Knee Extension Limitations  10 second holds    Functional Squat  1 set;10 reps;Limitations    Functional Squat Limitations  Quarter squat with verbal cues for form    Other Standing Knee Exercises  Single leg stance on the left lower extremity x 5 for 20-30 seconds each      Knee/Hip Exercises: Supine   Short Arc Quad Sets  AROM;Strengthening;Left;2 sets;10 reps    Short Arc Quad Sets Limitations  Over basketball with 3 second holds    Heel Slides  2 sets;10 reps    Heel Slides Limitations  5 second holds    Knee Extension Limitations  5    Knee Flexion Limitations  104      Manual Therapy   Manual Therapy  Joint mobilization;Muscle Energy Technique    Manual therapy comments  All manual therapy completed separately from the rest of skilled intervention    Joint Mobilization  3x 30-45 seconds for patellofemoral glides in all 4 directions grade III left lower extremity, 3 x 30-45 seconds AP/PA gildes grade III to tibiofemoral joint left lower extremity to improve ROM    Muscle Energy Technique  Contract relax to patient's left hamstrings and left quadriceps 10 second holds x 5 each to improve left knee range of motion             PT Education - 01/04/18 1304    Education provided  Yes    Education Details  Patient was educated on purpose and technique with exercises throughout session.     Person(s) Educated  Patient    Methods  Explanation;Verbal cues;Demonstration;Tactile cues    Comprehension  Verbalized understanding;Returned demonstration       PT Short Term Goals - 12/21/17 2019      PT SHORT TERM GOAL #1   Title  Patient will demonstrate understanding and report regular compliance with HEP.     Time  4    Period  Weeks    Status  New    Target Date  01/18/18      PT SHORT TERM GOAL #2   Title  Patient will demonstrate  left knee AROM extension/flexion of at least 5-95 degrees in order to assist patient with more normalized gait and stair ambulation.     Time  4    Period  Weeks    Status  New    Target Date  01/18/18  PT SHORT TERM GOAL #3   Title  Patient will demonstrate improvement of 1/2 MMT grade in all deficient tested musculature in order to assist patient in performing ambulation and stair ambulation with improved mechanics.     Time  4    Period  Weeks    Status  New    Target Date  01/18/18        PT Long Term Goals - 12/21/17 2023      PT LONG TERM GOAL #1   Title  Patient will demonstrate left knee AROM extension/flexion of at least 0-120 degrees in order to assist patient with ambulation, stairs, and squatting.     Time  8    Period  Weeks    Status  New    Target Date  02/15/18      PT LONG TERM GOAL #2   Title  Patient will demonstrate improvement of 1 MMT grade or be 5/5 in all deficient tested musculature in order to assist patient in performing ambulation and stair ambulation with improved mechanics.     Time  8    Period  Weeks    Status  New    Target Date  02/15/18      PT LONG TERM GOAL #3   Title  Patient will report ability to sleep through the night for at least 3 nights over the course of 1 week without waking up and taking medication to get back to sleep.     Time  8    Period  Weeks    Status  New    Target Date  02/15/18      PT LONG TERM GOAL #4   Title  Patient will demonstrate ability to perform the 5 times sit to stand test in 11 seconds or less indicating improved balance and decreased risk of falls.     Time  8    Period  Weeks    Status  New    Target Date  02/15/18      PT LONG TERM GOAL #5   Title  Patient will demonstrate ability to perform ascending and descending 13 stairs with single handrail with reciprocal gait pattern for improved mechanics and navigation of stairs at patient's home.     Time  8    Period  Weeks    Status  New     Target Date  02/15/18      Additional Long Term Goals   Additional Long Term Goals  Yes      PT LONG TERM GOAL #6   Title  Patient will report ability to stand for 20 minutes to allow for patient to prepare a meal at home.     Time  8    Period  Weeks    Status  New    Target Date  02/15/18            Plan - 01/04/18 1333    Clinical Impression Statement  This session focused on progressing patient's knee range of motion. This session patient was able to tolerate the bicycle on seat level 15 which was 1 notch further forward. This session added heelraises and standing hamstring curls. This session patient demonstrated improvement of left knee range of motion with knee extension at 5 degrees and knee flexion range of motion at 104 degrees. This session incorporated contract relax in manual therapy to increase patient's left knee range of motion. Patient continued to demonstrate improvement with range of motion  this session, slight soreness at end of session. Patient would benefit from continued skilled physical therapy to continue progressing patient's lower extremity strength, range of motion, balance, and overall functional mobility.     Rehab Potential  Good    Clinical Impairments Affecting Rehab Potential  Positive: Motivated; Negative: history of left knee problems     PT Frequency  2x / week    PT Duration  8 weeks    PT Treatment/Interventions  ADLs/Self Care Home Management;Aquatic Therapy;Cryotherapy;Moist Heat;DME Instruction;Gait training;Stair training;Functional mobility training;Therapeutic activities;Therapeutic exercise;Balance training;Neuromuscular re-education;Patient/family education;Orthotic Fit/Training;Manual techniques;Scar mobilization;Passive range of motion;Energy conservation;Taping    PT Next Visit Plan  Continue with manual therapy for edema reduction if edema is still present; initiate scar mobility when fully healed; progress ROM exercises for left knee  following protocol; such as TKE, bike without resistance; gastroc stretch; gait training    PT Home Exercise Plan  12/23/17: Quad set with towel under heel 2 x 15 with 5 second hold 1x/day; SAQ 2 x 15 repetitions with 3 second holds 1x/day; Heel slide supine 2 x 15 5 second holds 1x/day;  Seated self-assisted heel slide 2 x 15 3 second holds 1x/week; 12/28/17 - SAQ with basketball    Consulted and Agree with Plan of Care  Patient       Patient will benefit from skilled therapeutic intervention in order to improve the following deficits and impairments:  Abnormal gait, Decreased activity tolerance, Decreased endurance, Decreased range of motion, Decreased strength, Hypomobility, Increased fascial restricitons, Impaired sensation, Pain, Improper body mechanics, Decreased balance, Decreased mobility, Decreased scar mobility, Difficulty walking, Increased edema, Impaired flexibility  Visit Diagnosis: Acute pain of left knee  Muscle weakness (generalized)  Other symptoms and signs involving the musculoskeletal system  Other abnormalities of gait and mobility     Problem List Patient Active Problem List   Diagnosis Date Noted  . Primary osteoarthritis of knee 12/06/2017  . Primary localized osteoarthritis of left knee 11/24/2017  . Acute medial meniscus tear of left knee   . Hypertension     Clarene Critchley PT, DPT 2:14 PM, 01/04/18 [place], [place], [address] Phone: [phone]   Fax:  [phone]  Name: Nancy Steele MRN: [id number] Date of Birth: 05-Feb-1956

## 2018-01-06 ENCOUNTER — Encounter (HOSPITAL_COMMUNITY): Payer: Self-pay | Admitting: Physical Therapy

## 2018-01-06 ENCOUNTER — Ambulatory Visit (HOSPITAL_COMMUNITY): Payer: 59 | Admitting: Physical Therapy

## 2018-01-06 DIAGNOSIS — M25562 Pain in left knee: Secondary | ICD-10-CM | POA: Diagnosis not present

## 2018-01-06 DIAGNOSIS — M6281 Muscle weakness (generalized): Secondary | ICD-10-CM

## 2018-01-06 DIAGNOSIS — R2689 Other abnormalities of gait and mobility: Secondary | ICD-10-CM

## 2018-01-06 DIAGNOSIS — R29898 Other symptoms and signs involving the musculoskeletal system: Secondary | ICD-10-CM

## 2018-01-06 NOTE — Therapy (Signed)
[place], [place], [address] Phone: [phone]   Fax:  [phone]  Physical Therapy Treatment  Patient Details  Name: Nancy Steele MRN: [id number] Date of Birth: 14-Jul-1956 Referring Provider: Matthew Saras, PA-C   Encounter Date: 01/06/2018  PT End of Session - 01/06/18 1312    Visit Number  6    Number of Visits  17    Date for PT Re-Evaluation  01/18/18    Authorization Type  [employer]    Authorization Time Period  12/21/17 - 02/18/18    PT Start Time  1300    PT Stop Time  1345    PT Time Calculation (min)  45 min    Activity Tolerance  Patient tolerated treatment well    Behavior During Therapy  [facility] for tasks assessed/performed       Past Medical History:  Diagnosis Date  . Acute medial meniscus tear of left knee   . Allergy    seasonal  . Arthritis   . Blood transfusion without reported diagnosis    28 yrs ago  . Cataract    left eye-removal with implant   . DDD (degenerative disc disease), cervical   . Family history of adverse reaction to anesthesia    brother slow to wake up  . Glaucoma    glucoma like issue- cuased from past surgery increasing pressurein the eye   . Hypertension   . Primary localized osteoarthritis of left knee 11/24/2017    Past Surgical History:  Procedure Laterality Date  . CARPAL TUNNEL RELEASE     bilateral  . CERVICAL FUSION  2000  . CHONDROPLASTY Left 10/17/2014   Procedure: CHONDROPLASTY;  Surgeon: Lorn Junes, MD;  Location: [facility];  Service: Orthopedics;  Laterality: Left;  . COLONOSCOPY    . KNEE ARTHROSCOPY     bilateral  . KNEE ARTHROSCOPY WITH LATERAL MENISECTOMY Left 10/17/2014   Procedure: KNEE ARTHROSCOPY WITH LATERAL MENISECTOMY;  Surgeon: Lorn Junes, MD;  Location: [facility];  Service: Orthopedics;  Laterality: Left;  . KNEE ARTHROSCOPY WITH MEDIAL MENISECTOMY Left 10/17/2014   Procedure:  LEFT KNEE ARTHROSCOPY WITH MEDIAL MENISCECTOMY;  Surgeon: Lorn Junes, MD;  Location: [facility];  Service: Orthopedics;  Laterality: Left;  . POLYPECTOMY    . TOTAL KNEE ARTHROPLASTY Left 12/06/2017   Procedure: LEFT TOTAL KNEE ARTHROPLASTY;  Surgeon: Elsie Saas, MD;  Location: [facility];  Service: Orthopedics;  Laterality: Left;  . UPPER GI ENDOSCOPY     pt states she has never had an EGD in the past     There were no vitals filed for this visit.  Subjective Assessment - 01/06/18 1259    Subjective  Pt states that she is doing well with her exercises but she can tell the weather still gets to her.      Pertinent History  S/P left TKA 12/06/17    Limitations  Standing;Sitting;Walking;House hold activities    How long can you sit comfortably?  1 hour    How long can you stand comfortably?  5-10 minutes    How long can you walk comfortably?  30 minutes    Diagnostic tests  12/06/17: Left knee "satisfactory left knee replacement"    Patient Stated Goals  To get her leg straight; and sleep on side with knees curled up     Currently in Pain?  Yes    Pain Score  3  Pain Location  Knee    Pain Orientation  Left    Pain Descriptors / Indicators  Aching    Pain Type  Acute pain    Pain Onset  1 to 4 weeks ago    Pain Frequency  Intermittent    Aggravating Factors   weather     Pain Relieving Factors  ice     Effect of Pain on Daily Activities  limits                       [facility] Adult PT Treatment/Exercise - 01/06/18 0001      Exercises   Exercises  Knee/Hip      Knee/Hip Exercises: Stretches   Active Hamstring Stretch  Left;3 reps;30 seconds    Passive Hamstring Stretch  3 reps;30 seconds    Passive Hamstring Stretch Limitations  12" box    Knee: Self-Stretch to increase Flexion  10 seconds;Left;Limitations;Other (comment) 10 repetitions    Knee: Self-Stretch Limitations  12" box    Gastroc Stretch  Both;30 seconds;3 reps    Gastroc Stretch  Limitations  On slant board       Knee/Hip Exercises: Standing   Heel Raises  Both;15 reps    Knee Flexion  AROM;Strengthening;Left;Other (comment);10 reps;1 set Standing hamstring curls    Terminal Knee Extension  Strengthening;Left;15 reps    Lateral Step Up  Left;Hand Hold: 2;Step Height: 6"    Forward Step Up  Right;10 reps;Hand Hold: 2;Step Height: 6"    Functional Squat  1 set;15 reps;Limitations    Rocker Board  2 minutes    SLS with Vectors  15" x3       Knee/Hip Exercises: Supine   Quad Sets  Left;5 reps    Terminal Knee Extension  Left;10 reps    Knee Extension  AROM    Knee Extension Limitations  5    Knee Flexion Limitations  116      Manual Therapy   Manual Therapy  Edema management;Joint mobilization    Manual therapy comments  All manual therapy completed separately from the rest of skilled intervention    Edema Management  Performed supine with patient's lower extremities elevated retrograde massage to left lower extremity for edema reduction               PT Short Term Goals - 12/21/17 2019      PT SHORT TERM GOAL #1   Title  Patient will demonstrate understanding and report regular compliance with HEP.     Time  4    Period  Weeks    Status  New    Target Date  01/18/18      PT SHORT TERM GOAL #2   Title  Patient will demonstrate left knee AROM extension/flexion of at least 5-95 degrees in order to assist patient with more normalized gait and stair ambulation.     Time  4    Period  Weeks    Status  New    Target Date  01/18/18      PT SHORT TERM GOAL #3   Title  Patient will demonstrate improvement of 1/2 MMT grade in all deficient tested musculature in order to assist patient in performing ambulation and stair ambulation with improved mechanics.     Time  4    Period  Weeks    Status  New    Target Date  01/18/18        PT Long Term Goals -  12/21/17 2023      PT LONG TERM GOAL #1   Title  Patient will demonstrate left knee AROM  extension/flexion of at least 0-120 degrees in order to assist patient with ambulation, stairs, and squatting.     Time  8    Period  Weeks    Status  New    Target Date  02/15/18      PT LONG TERM GOAL #2   Title  Patient will demonstrate improvement of 1 MMT grade or be 5/5 in all deficient tested musculature in order to assist patient in performing ambulation and stair ambulation with improved mechanics.     Time  8    Period  Weeks    Status  New    Target Date  02/15/18      PT LONG TERM GOAL #3   Title  Patient will report ability to sleep through the night for at least 3 nights over the course of 1 week without waking up and taking medication to get back to sleep.     Time  8    Period  Weeks    Status  New    Target Date  02/15/18      PT LONG TERM GOAL #4   Title  Patient will demonstrate ability to perform the 5 times sit to stand test in 11 seconds or less indicating improved balance and decreased risk of falls.     Time  8    Period  Weeks    Status  New    Target Date  02/15/18      PT LONG TERM GOAL #5   Title  Patient will demonstrate ability to perform ascending and descending 13 stairs with single handrail with reciprocal gait pattern for improved mechanics and navigation of stairs at patient's home.     Time  8    Period  Weeks    Status  New    Target Date  02/15/18      Additional Long Term Goals   Additional Long Term Goals  Yes      PT LONG TERM GOAL #6   Title  Patient will report ability to stand for 20 minutes to allow for patient to prepare a meal at home.     Time  8    Period  Weeks    Status  New    Target Date  02/15/18            Plan - 01/06/18 1345    Clinical Impression Statement  Significant improvement in right knee flexion this session.  Added strengthening as well as increasing difficulty of balance by adding vector stance exercises.  Pt voiced concern on sensitivity of scar therapist verbalized how a desensitization progam  would progress and urged pt to google desensitization program and begin at home.     Rehab Potential  Good    Clinical Impairments Affecting Rehab Potential  Positive: Motivated; Negative: history of left knee problems     PT Frequency  2x / week    PT Duration  8 weeks    PT Treatment/Interventions  ADLs/Self Care Home Management;Aquatic Therapy;Cryotherapy;Moist Heat;DME Instruction;Gait training;Stair training;Functional mobility training;Therapeutic activities;Therapeutic exercise;Balance training;Neuromuscular re-education;Patient/family education;Orthotic Fit/Training;Manual techniques;Scar mobilization;Passive range of motion;Energy conservation;Taping    PT Next Visit Plan  Continue with manual therapy for edema reduction if edema is still present; initiate scar mobility see if pt has any questions on the scar desensitization.     PT Home  Exercise Plan  12/23/17: Quad set with towel under heel 2 x 15 with 5 second hold 1x/day; SAQ 2 x 15 repetitions with 3 second holds 1x/day; Heel slide supine 2 x 15 5 second holds 1x/day;  Seated self-assisted heel slide 2 x 15 3 second holds 1x/week; 12/28/17 - SAQ with basketball    Consulted and Agree with Plan of Care  Patient       Patient will benefit from skilled therapeutic intervention in order to improve the following deficits and impairments:  Abnormal gait, Decreased activity tolerance, Decreased endurance, Decreased range of motion, Decreased strength, Hypomobility, Increased fascial restricitons, Impaired sensation, Pain, Improper body mechanics, Decreased balance, Decreased mobility, Decreased scar mobility, Difficulty walking, Increased edema, Impaired flexibility  Visit Diagnosis: Acute pain of left knee  Muscle weakness (generalized)  Other symptoms and signs involving the musculoskeletal system  Other abnormalities of gait and mobility     Problem List Patient Active Problem List   Diagnosis Date Noted  . Primary osteoarthritis  of knee 12/06/2017  . Primary localized osteoarthritis of left knee 11/24/2017  . Acute medial meniscus tear of left knee   . Hypertension    Rayetta Humphrey, [place] CLT [phone] 01/06/2018, 1:49 PM  [facility] [address] [place], [place], [address] Phone: [phone]   Fax:  [phone]  Name: Nancy Steele MRN: [id number] Date of Birth: May 06, 1956

## 2018-01-10 ENCOUNTER — Ambulatory Visit
Admission: RE | Admit: 2018-01-10 | Discharge: 2018-01-10 | Disposition: A | Discharge status: Home / Self Care | Payer: 59 | Source: Ambulatory Visit | Attending: Obstetrics | Admitting: Obstetrics

## 2018-01-10 DIAGNOSIS — Z1231 Encounter for screening mammogram for malignant neoplasm of breast: Secondary | ICD-10-CM

## 2018-01-11 ENCOUNTER — Ambulatory Visit (HOSPITAL_COMMUNITY): Payer: 59 | Admitting: Physical Therapy

## 2018-01-11 DIAGNOSIS — M6281 Muscle weakness (generalized): Secondary | ICD-10-CM

## 2018-01-11 DIAGNOSIS — M25562 Pain in left knee: Secondary | ICD-10-CM

## 2018-01-11 DIAGNOSIS — R2689 Other abnormalities of gait and mobility: Secondary | ICD-10-CM

## 2018-01-11 DIAGNOSIS — R29898 Other symptoms and signs involving the musculoskeletal system: Secondary | ICD-10-CM

## 2018-01-11 NOTE — Therapy (Addendum)
[place], [place], [address] Phone: [phone]   Fax:  [phone]  Physical Therapy Treatment  Patient Details  Name: Nancy Steele MRN: [id number] Date of Birth: 1956/04/14 Referring Provider: Matthew Saras, PA-C   Encounter Date: 01/11/2018  PT End of Session - 01/11/18 1329    Visit Number  7    Number of Visits  17    Date for PT Re-Evaluation  01/18/18    Authorization Type  [employer]    Authorization Time Period  12/21/17 - 02/18/18    PT Start Time  1302    PT Stop Time  1345    PT Time Calculation (min)  43 min    Activity Tolerance  Patient tolerated treatment well    Behavior During Therapy  [facility] for tasks assessed/performed       Past Medical History:  Diagnosis Date  . Acute medial meniscus tear of left knee   . Allergy    seasonal  . Arthritis   . Blood transfusion without reported diagnosis    28 yrs ago  . Cataract    left eye-removal with implant   . DDD (degenerative disc disease), cervical   . Family history of adverse reaction to anesthesia    brother slow to wake up  . Glaucoma    glucoma like issue- cuased from past surgery increasing pressurein the eye   . Hypertension   . Primary localized osteoarthritis of left knee 11/24/2017    Past Surgical History:  Procedure Laterality Date  . CARPAL TUNNEL RELEASE     bilateral  . CERVICAL FUSION  2000  . CHONDROPLASTY Left 10/17/2014   Procedure: CHONDROPLASTY;  Surgeon: Lorn Junes, MD;  Location: [facility];  Service: Orthopedics;  Laterality: Left;  . COLONOSCOPY    . KNEE ARTHROSCOPY     bilateral  . KNEE ARTHROSCOPY WITH LATERAL MENISECTOMY Left 10/17/2014   Procedure: KNEE ARTHROSCOPY WITH LATERAL MENISECTOMY;  Surgeon: Lorn Junes, MD;  Location: [facility];  Service: Orthopedics;  Laterality: Left;  . KNEE ARTHROSCOPY WITH MEDIAL MENISECTOMY Left 10/17/2014   Procedure:  LEFT KNEE ARTHROSCOPY WITH MEDIAL MENISCECTOMY;  Surgeon: Lorn Junes, MD;  Location: [facility];  Service: Orthopedics;  Laterality: Left;  . POLYPECTOMY    . TOTAL KNEE ARTHROPLASTY Left 12/06/2017   Procedure: LEFT TOTAL KNEE ARTHROPLASTY;  Surgeon: Elsie Saas, MD;  Location: [facility];  Service: Orthopedics;  Laterality: Left;  . UPPER GI ENDOSCOPY     pt states she has never had an EGD in the past     There were no vitals filed for this visit.  Subjective Assessment - 01/11/18 1306    Subjective  PT states she walked alot over the weekend and iced afterward.  States it was more sore than normal due to this but currently no pain just stiffness/soreness.    Currently in Pain?  No/denies                No data recorded       [facility] Adult PT Treatment/Exercise - 01/11/18 0001      Knee/Hip Exercises: Stretches   Passive Hamstring Stretch  3 reps;30 seconds    Passive Hamstring Stretch Limitations  12" box    Knee: Self-Stretch to increase Flexion  10 seconds;Left;Limitations;Other (comment)    Knee: Self-Stretch Limitations  12" box    Gastroc Stretch  Both;30 seconds;3 reps  Gastroc Stretch Limitations  On slant board       Knee/Hip Exercises: Standing   Lateral Step Up  10 reps;Hand Hold: 1;Step Height: 6";Both    Forward Step Up  10 reps;Hand Hold: 1;Step Height: 6";Both    Step Down  10 reps;Hand Hold: 1;Step Height: 4";Both    Rocker Board  2 minutes Rt/Lt only    SLS with Vectors  15" x3       Knee/Hip Exercises: Supine   Quad Sets  Left;5 reps    Short Arc Quad Sets  AROM;Strengthening;Left;2 sets;10 reps    Short Arc Quad Sets Limitations  Over basketball with 3 second holds    Heel Slides  2 sets;10 reps    Heel Slides Limitations  5 second holds    Knee Extension  AROM    Knee Extension Limitations  3    Knee Flexion  AROM    Knee Flexion Limitations  118     Knee/Hip Exercises: Prone   Hamstring Curl  15 reps    Other Prone  Exercises  terminal knee extension 10X5"      Manual Therapy   Manual Therapy  Edema management;Joint mobilization    Manual therapy comments  All manual therapy completed separately from the rest of skilled intervention    Edema Management  Performed supine with patient's lower extremities elevated retrograde massage to left lower extremity for edema reduction               PT Short Term Goals - 12/21/17 2019      PT SHORT TERM GOAL #1   Title  Patient will demonstrate understanding and report regular compliance with HEP.     Time  4    Period  Weeks    Status  New    Target Date  01/18/18      PT SHORT TERM GOAL #2   Title  Patient will demonstrate left knee AROM extension/flexion of at least 5-95 degrees in order to assist patient with more normalized gait and stair ambulation.     Time  4    Period  Weeks    Status  New    Target Date  01/18/18      PT SHORT TERM GOAL #3   Title  Patient will demonstrate improvement of 1/2 MMT grade in all deficient tested musculature in order to assist patient in performing ambulation and stair ambulation with improved mechanics.     Time  4    Period  Weeks    Status  New    Target Date  01/18/18        PT Long Term Goals - 12/21/17 2023      PT LONG TERM GOAL #1   Title  Patient will demonstrate left knee AROM extension/flexion of at least 0-120 degrees in order to assist patient with ambulation, stairs, and squatting.     Time  8    Period  Weeks    Status  New    Target Date  02/15/18      PT LONG TERM GOAL #2   Title  Patient will demonstrate improvement of 1 MMT grade or be 5/5 in all deficient tested musculature in order to assist patient in performing ambulation and stair ambulation with improved mechanics.     Time  8    Period  Weeks    Status  New    Target Date  02/15/18      PT LONG TERM  GOAL #3   Title  Patient will report ability to sleep through the night for at least 3 nights over the course of 1 week  without waking up and taking medication to get back to sleep.     Time  8    Period  Weeks    Status  New    Target Date  02/15/18      PT LONG TERM GOAL #4   Title  Patient will demonstrate ability to perform the 5 times sit to stand test in 11 seconds or less indicating improved balance and decreased risk of falls.     Time  8    Period  Weeks    Status  New    Target Date  02/15/18      PT LONG TERM GOAL #5   Title  Patient will demonstrate ability to perform ascending and descending 13 stairs with single handrail with reciprocal gait pattern for improved mechanics and navigation of stairs at patient's home.     Time  8    Period  Weeks    Status  New    Target Date  02/15/18      Additional Long Term Goals   Additional Long Term Goals  Yes      PT LONG TERM GOAL #6   Title  Patient will report ability to stand for 20 minutes to allow for patient to prepare a meal at home.     Time  8    Period  Weeks    Status  New    Target Date  02/15/18            Plan - 01/11/18 1334    Clinical Impression Statement  continued focus on ROM this session with patient completing session without difficulty or complaints.  Able to increase reps of some therex.  Manual completed at EOS with several "pops" of scar tissue release in infrapatellar region.  ROM increased slightly today at 3-118.  Swelling persists, however is improving.      Rehab Potential  Good    Clinical Impairments Affecting Rehab Potential  Positive: Motivated; Negative: history of left knee problems     PT Frequency  2x / week    PT Duration  8 weeks    PT Treatment/Interventions  ADLs/Self Care Home Management;Aquatic Therapy;Cryotherapy;Moist Heat;DME Instruction;Gait training;Stair training;Functional mobility training;Therapeutic activities;Therapeutic exercise;Balance training;Neuromuscular re-education;Patient/family education;Orthotic Fit/Training;Manual techniques;Scar mobilization;Passive range of  motion;Energy conservation;Taping    PT Next Visit Plan  Continue with manual therapy for edema reduction if edema is still present; initiate scar mobility see if pt has any questions on the scar desensitization. Measure ROM each session.    PT Home Exercise Plan  12/23/17: Quad set with towel under heel 2 x 15 with 5 second hold 1x/day; SAQ 2 x 15 repetitions with 3 second holds 1x/day; Heel slide supine 2 x 15 5 second holds 1x/day;  Seated self-assisted heel slide 2 x 15 3 second holds 1x/week; 12/28/17 - SAQ with basketball    Consulted and Agree with Plan of Care  Patient       Patient will benefit from skilled therapeutic intervention in order to improve the following deficits and impairments:  Abnormal gait, Decreased activity tolerance, Decreased endurance, Decreased range of motion, Decreased strength, Hypomobility, Increased fascial restricitons, Impaired sensation, Pain, Improper body mechanics, Decreased balance, Decreased mobility, Decreased scar mobility, Difficulty walking, Increased edema, Impaired flexibility  Visit Diagnosis: Acute pain of left knee  Muscle weakness (generalized)  Other symptoms and signs involving the musculoskeletal system  Other abnormalities of gait and mobility     Problem List Patient Active Problem List   Diagnosis Date Noted  . Primary osteoarthritis of knee 12/06/2017  . Primary localized osteoarthritis of left knee 11/24/2017  . Acute medial meniscus tear of left knee   . Hypertension    Teena Irani, PTA/CLT [phone]  Teena Irani 01/11/2018, 4:24 PM  [facility] [address] [place], [place], [address] Phone: [phone]   Fax:  [phone]  Name: VERNETA HAMIDI MRN: [id number] Date of Birth: 1956/07/31

## 2018-01-13 ENCOUNTER — Ambulatory Visit (HOSPITAL_COMMUNITY): Payer: 59 | Admitting: Physical Therapy

## 2018-01-13 ENCOUNTER — Encounter (HOSPITAL_COMMUNITY): Payer: Self-pay | Admitting: Physical Therapy

## 2018-01-13 DIAGNOSIS — M25562 Pain in left knee: Secondary | ICD-10-CM | POA: Diagnosis not present

## 2018-01-13 DIAGNOSIS — M6281 Muscle weakness (generalized): Secondary | ICD-10-CM

## 2018-01-13 DIAGNOSIS — R2689 Other abnormalities of gait and mobility: Secondary | ICD-10-CM

## 2018-01-13 DIAGNOSIS — R29898 Other symptoms and signs involving the musculoskeletal system: Secondary | ICD-10-CM

## 2018-01-13 NOTE — Therapy (Signed)
[place], [place], [address] Phone: [phone]   Fax:  [phone]  Physical Therapy Treatment  Patient Details  Name: Nancy Steele MRN: [id number] Date of Birth: 10-01-1956 Referring Provider: Matthew Saras, PA-C   Encounter Date: 01/13/2018  PT End of Session - 01/13/18 1354    Visit Number  8    Number of Visits  17    Date for PT Re-Evaluation  01/18/18    Authorization Type  [employer]    Authorization Time Period  12/21/17 - 02/18/18    PT Start Time  1306    PT Stop Time  1345    PT Time Calculation (min)  39 min    Activity Tolerance  Patient tolerated treatment well    Behavior During Therapy  [facility] for tasks assessed/performed       Past Medical History:  Diagnosis Date  . Acute medial meniscus tear of left knee   . Allergy    seasonal  . Arthritis   . Blood transfusion without reported diagnosis    28 yrs ago  . Cataract    left eye-removal with implant   . DDD (degenerative disc disease), cervical   . Family history of adverse reaction to anesthesia    brother slow to wake up  . Glaucoma    glucoma like issue- cuased from past surgery increasing pressurein the eye   . Hypertension   . Primary localized osteoarthritis of left knee 11/24/2017    Past Surgical History:  Procedure Laterality Date  . CARPAL TUNNEL RELEASE     bilateral  . CERVICAL FUSION  2000  . CHONDROPLASTY Left 10/17/2014   Procedure: CHONDROPLASTY;  Surgeon: Lorn Junes, MD;  Location: [place];  Service: Orthopedics;  Laterality: Left;  . COLONOSCOPY    . KNEE ARTHROSCOPY     bilateral  . KNEE ARTHROSCOPY WITH LATERAL MENISECTOMY Left 10/17/2014   Procedure: KNEE ARTHROSCOPY WITH LATERAL MENISECTOMY;  Surgeon: Lorn Junes, MD;  Location: [facility];  Service: Orthopedics;  Laterality: Left;  . KNEE ARTHROSCOPY WITH MEDIAL MENISECTOMY Left 10/17/2014   Procedure:  LEFT KNEE ARTHROSCOPY WITH MEDIAL MENISCECTOMY;  Surgeon: Lorn Junes, MD;  Location: [facility];  Service: Orthopedics;  Laterality: Left;  . POLYPECTOMY    . TOTAL KNEE ARTHROPLASTY Left 12/06/2017   Procedure: LEFT TOTAL KNEE ARTHROPLASTY;  Surgeon: Elsie Saas, MD;  Location: [facility];  Service: Orthopedics;  Laterality: Left;  . UPPER GI ENDOSCOPY     pt states she has never had an EGD in the past     There were no vitals filed for this visit.  Subjective Assessment - 01/13/18 1309    Subjective  Patient stated she isn't having bad pain maybe 2/10 pain in her left knee. Patient reported she has been doing her exercises.     Currently in Pain?  Yes    Pain Score  2     Pain Location  Knee    Pain Orientation  Left    Pain Descriptors / Indicators  Aching    Pain Type  Acute pain    Pain Onset  More than a month ago    Multiple Pain Sites  No                No data recorded       [facility] Adult PT Treatment/Exercise - 01/13/18 0001  Knee/Hip Exercises: Stretches   Passive Hamstring Stretch  3 reps;30 seconds    Passive Hamstring Stretch Limitations  12" box    Knee: Self-Stretch to increase Flexion  10 seconds;Left;Limitations;Other (comment) 10 repetitions    Knee: Self-Stretch Limitations  12" box    Gastroc Stretch  Both;30 seconds;3 reps    Gastroc Stretch Limitations  On slant board       Knee/Hip Exercises: Standing   Terminal Knee Extension  Strengthening;Left;Theraband;10 reps Red theraband. Holding for 10 seconds    Lateral Step Up  10 reps;Step Height: 6";Left;Hand Hold: 1    Forward Step Up  10 reps;Step Height: 6";Hand Hold: 0;Left    Step Down  10 reps;Hand Hold: 1;Step Height: 6";Left    Functional Squat  1 set;15 reps;Limitations    Functional Squat Limitations  Bilateral upper extremity support. Verbal cues to not allow knees to pass toes.     Rocker Board  2 minutes;Other (comment) Right and left only    SLS with Vectors   15" without upper extremity support forward, side, and back x 3 each standing on left lower extremity      Knee/Hip Exercises: Supine   Quad Sets  Left;5 reps;Other (comment) 5 second holds    Short [employer]  AROM;Strengthening;Left;2 sets;10 reps    Short [employer] Limitations  On foam roll 3 second holds    Heel Slides  2 sets;10 reps    Heel Slides Limitations  5 second holds    Knee Extension  AROM    Knee Extension Limitations  5    Knee Flexion  AROM    Knee Flexion Limitations  118      Knee/Hip Exercises: Prone   Hamstring Curl  15 reps    Other Prone Exercises  terminal knee extension 10X5"             PT Education - 01/13/18 1353    Education provided  Yes    Education Details  Patient was educated on purpose and technique of exercises throghout session and patient's HEP was updated.     Person(s) Educated  Patient    Methods  Explanation;Verbal cues;Tactile cues;Handout    Comprehension  Verbalized understanding;Returned demonstration       PT Short Term Goals - 12/21/17 2019      PT SHORT TERM GOAL #1   Title  Patient will demonstrate understanding and report regular compliance with HEP.     Time  4    Period  Weeks    Status  New    Target Date  01/18/18      PT SHORT TERM GOAL #2   Title  Patient will demonstrate left knee AROM extension/flexion of at least 5-95 degrees in order to assist patient with more normalized gait and stair ambulation.     Time  4    Period  Weeks    Status  New    Target Date  01/18/18      PT SHORT TERM GOAL #3   Title  Patient will demonstrate improvement of 1/2 MMT grade in all deficient tested musculature in order to assist patient in performing ambulation and stair ambulation with improved mechanics.     Time  4    Period  Weeks    Status  New    Target Date  01/18/18        PT Long Term Goals - 12/21/17 2023      PT LONG TERM GOAL #  1   Title  Patient will demonstrate left knee AROM extension/flexion  of at least 0-120 degrees in order to assist patient with ambulation, stairs, and squatting.     Time  8    Period  Weeks    Status  New    Target Date  02/15/18      PT LONG TERM GOAL #2   Title  Patient will demonstrate improvement of 1 MMT grade or be 5/5 in all deficient tested musculature in order to assist patient in performing ambulation and stair ambulation with improved mechanics.     Time  8    Period  Weeks    Status  New    Target Date  02/15/18      PT LONG TERM GOAL #3   Title  Patient will report ability to sleep through the night for at least 3 nights over the course of 1 week without waking up and taking medication to get back to sleep.     Time  8    Period  Weeks    Status  New    Target Date  02/15/18      PT LONG TERM GOAL #4   Title  Patient will demonstrate ability to perform the 5 times sit to stand test in 11 seconds or less indicating improved balance and decreased risk of falls.     Time  8    Period  Weeks    Status  New    Target Date  02/15/18      PT LONG TERM GOAL #5   Title  Patient will demonstrate ability to perform ascending and descending 13 stairs with single handrail with reciprocal gait pattern for improved mechanics and navigation of stairs at patient's home.     Time  8    Period  Weeks    Status  New    Target Date  02/15/18      Additional Long Term Goals   Additional Long Term Goals  Yes      PT LONG TERM GOAL #6   Title  Patient will report ability to stand for 20 minutes to allow for patient to prepare a meal at home.     Time  8    Period  Weeks    Status  New    Target Date  02/15/18            Plan - 01/13/18 1355    Clinical Impression Statement  This session continued to progress patient primarily with range of motion exercises for her left knee.  This session was able to increase step height of step-downs to 6 inch step. Patient was also able to perform forward step-ups without upper extremity support this  session. Patient's active knee extension range of motion was found to be 5 degrees this session, and patient was still at 118 degrees of knee flexion active range of motion. Therapist updated patient's home exercises program to include prone hamstring curls at this session. Patient would benefit from continued skilled physical therapy to continue progress toward functional goals.     Rehab Potential  Good    Clinical Impairments Affecting Rehab Potential  Positive: Motivated; Negative: history of left knee problems     PT Frequency  2x / week    PT Duration  8 weeks    PT Treatment/Interventions  ADLs/Self Care Home Management;Aquatic Therapy;Cryotherapy;Moist Heat;DME Instruction;Gait training;Stair training;Functional mobility training;Therapeutic activities;Therapeutic exercise;Balance training;Neuromuscular re-education;Patient/family education;Orthotic Fit/Training;Manual techniques;Scar mobilization;Passive range of  motion;Energy conservation;Taping    PT Next Visit Plan  Continue with manual therapy for edema reduction if edema is still present; initiate scar mobility see if pt has any questions on the scar desensitization. Measure ROM each session.    PT Home Exercise Plan  12/23/17: Quad set with towel under heel 2 x 15 with 5 second hold 1x/day; SAQ 2 x 15 repetitions with 3 second holds 1x/day; Heel slide supine 2 x 15 5 second holds 1x/day;  Seated self-assisted heel slide 2 x 15 3 second holds 1x/week; 12/28/17 - SAQ with basketball; 01/13/18: Hamstring curls x 10 prone 1x/day    Consulted and Agree with Plan of Care  Patient       Patient will benefit from skilled therapeutic intervention in order to improve the following deficits and impairments:  Abnormal gait, Decreased activity tolerance, Decreased endurance, Decreased range of motion, Decreased strength, Hypomobility, Increased fascial restricitons, Impaired sensation, Pain, Improper body mechanics, Decreased balance, Decreased mobility,  Decreased scar mobility, Difficulty walking, Increased edema, Impaired flexibility  Visit Diagnosis: Acute pain of left knee  Muscle weakness (generalized)  Other symptoms and signs involving the musculoskeletal system  Other abnormalities of gait and mobility     Problem List Patient Active Problem List   Diagnosis Date Noted  . Primary osteoarthritis of knee 12/06/2017  . Primary localized osteoarthritis of left knee 11/24/2017  . Acute medial meniscus tear of left knee   . Hypertension    Clarene Critchley PT, DPT 1:59 PM, 01/13/18 [place], [place], [address] Phone: [phone]   Fax:  [phone]  Name: Nancy Steele MRN: [id number] Date of Birth: 1956/09/04

## 2018-01-13 NOTE — Patient Instructions (Signed)
  PRONE HAMSTRING CURLS While lying face down, slowly bend your knee as you bring your foot towards your Buttock. Repeat 10 Times Hold 1 Second Complete 1 Set Perform 1 Time(s) a Day

## 2018-01-18 ENCOUNTER — Ambulatory Visit (HOSPITAL_COMMUNITY): Payer: 59 | Attending: Physician Assistant | Admitting: Physical Therapy

## 2018-01-18 ENCOUNTER — Encounter (HOSPITAL_COMMUNITY): Payer: Self-pay | Admitting: Physical Therapy

## 2018-01-18 DIAGNOSIS — R29898 Other symptoms and signs involving the musculoskeletal system: Secondary | ICD-10-CM | POA: Diagnosis not present

## 2018-01-18 DIAGNOSIS — M25562 Pain in left knee: Secondary | ICD-10-CM | POA: Diagnosis present

## 2018-01-18 DIAGNOSIS — M6281 Muscle weakness (generalized): Secondary | ICD-10-CM | POA: Diagnosis not present

## 2018-01-18 DIAGNOSIS — R2689 Other abnormalities of gait and mobility: Secondary | ICD-10-CM | POA: Diagnosis present

## 2018-01-18 NOTE — Therapy (Signed)
[place], [place], [address] Phone: [phone]   Fax:  [phone]  Physical Therapy Treatment / Re-assessment  Patient Details  Name: Nancy Steele MRN: [id number] Date of Birth: 1956-09-17 Referring Provider: Matthew Saras, PA-C   Encounter Date: 01/18/2018  PT End of Session - 01/18/18 1413    Visit Number  9    Number of Visits  17    Date for PT Re-Evaluation  02/18/18    Authorization Type  [employer]    Authorization Time Period  12/21/17 - 02/18/18    PT Start Time  1302    PT Stop Time  1345 Some time unbilled for re-assessment    PT Time Calculation (min)  43 min    Activity Tolerance  Patient tolerated treatment well    Behavior During Therapy  [facility] for tasks assessed/performed       Past Medical History:  Diagnosis Date  . Acute medial meniscus tear of left knee   . Allergy    seasonal  . Arthritis   . Blood transfusion without reported diagnosis    28 yrs ago  . Cataract    left eye-removal with implant   . DDD (degenerative disc disease), cervical   . Family history of adverse reaction to anesthesia    brother slow to wake up  . Glaucoma    glucoma like issue- cuased from past surgery increasing pressurein the eye   . Hypertension   . Primary localized osteoarthritis of left knee 11/24/2017    Past Surgical History:  Procedure Laterality Date  . CARPAL TUNNEL RELEASE     bilateral  . CERVICAL FUSION  2000  . CHONDROPLASTY Left 10/17/2014   Procedure: CHONDROPLASTY;  Surgeon: Lorn Junes, MD;  Location: [facility];  Service: Orthopedics;  Laterality: Left;  . COLONOSCOPY    . KNEE ARTHROSCOPY     bilateral  . KNEE ARTHROSCOPY WITH LATERAL MENISECTOMY Left 10/17/2014   Procedure: KNEE ARTHROSCOPY WITH LATERAL MENISECTOMY;  Surgeon: Lorn Junes, MD;  Location: [facility];  Service: Orthopedics;  Laterality: Left;  . KNEE ARTHROSCOPY  WITH MEDIAL MENISECTOMY Left 10/17/2014   Procedure: LEFT KNEE ARTHROSCOPY WITH MEDIAL MENISCECTOMY;  Surgeon: Lorn Junes, MD;  Location: [facility];  Service: Orthopedics;  Laterality: Left;  . POLYPECTOMY    . TOTAL KNEE ARTHROPLASTY Left 12/06/2017   Procedure: LEFT TOTAL KNEE ARTHROPLASTY;  Surgeon: Elsie Saas, MD;  Location: [facility];  Service: Orthopedics;  Laterality: Left;  . UPPER GI ENDOSCOPY     pt states she has never had an EGD in the past     There were no vitals filed for this visit.  Subjective Assessment - 01/18/18 1331    Subjective  Patient stated she is just having some aching pain about a 3/10 pain in her left knee due to the weather. She stated she feels like therapy has been helping and that her physician said it seems like she has made good improvement. Patient reported that she has been doing her home exercises once a day.     Pertinent History  S/P left TKA 12/06/17    Limitations  Standing;Sitting;Walking;House hold activities    How long can you sit comfortably?  1.5 hours    How long can you stand comfortably?  20-25 minutes    How long can you walk comfortably?  30 minutes    Diagnostic tests  12/06/17:  Left knee "satisfactory left knee replacement"    Patient Stated Goals  To get her leg straight; and sleep on side with knees curled up     Currently in Pain?  Yes    Pain Score  3     Pain Location  Knee    Pain Orientation  Left    Pain Descriptors / Indicators  Aching    Pain Type  Acute pain    Pain Onset  More than a month ago    Aggravating Factors   weather    Pain Relieving Factors  ice    Effect of Pain on Daily Activities  limits    Multiple Pain Sites  No         [facility] PT Assessment - 01/18/18 0001      Assessment   Medical Diagnosis  Primary localized osteoarthritis of left knee; S/p left TKA    Referring Provider  Matthew Saras, PA-C    Onset Date/Surgical Date  12/06/17    Next MD Visit  02/28/18    Prior  Therapy  Yes, home health PT      Restrictions   Weight Bearing Restrictions  Yes    LLE Weight Bearing  Weight bearing as tolerated      Prior Function   Level of Independence  Independent      Cognition   Overall Cognitive Status  Within Functional Limits for tasks assessed      Observation/Other Assessments   Observations  Patient's incision appeared clean and intact, no redness noted    Focus on Therapeutic Outcomes (FOTO)   51% (49% limited)       Circumferential Edema   Circumferential - Right  17 inches Midpatellar     Circumferential - Left   17.25 inches Midpatellar      Sensation   Light Touch  Impaired by gross assessment    Additional Comments  Patient reported all areas of left lower leg felt normal except on the lateral side of her left knee      AROM   Right Knee Extension  0    Right Knee Flexion  125    Left Knee Extension  5    Left Knee Flexion  118      Strength   Right Hip Flexion  5/5 was 5/5    Right Hip Extension  4+/5 was 4+/5    Right Hip ABduction  5/5 was 5/5    Left Hip Flexion  4+/5 was 4+/5    Left Hip Extension  4+/5 was 3+/5    Left Hip ABduction  4+/5 was 4/5    Right Knee Flexion  5/5 was 5/5    Right Knee Extension  5/5 was 5/5    Left Knee Flexion  4+/5 was 3/5    Left Knee Extension  4+/5 Painful. Was 4+/5    Right Ankle Dorsiflexion  5/5 Was 5/5    Left Ankle Dorsiflexion  5/5 was 5/5      Palpation   Palpation comment  Patient reported tenderness to palpation around the medial side of her left knee.       Ambulation/Gait   Ambulation/Gait  Yes    Ambulation/Gait Assistance  7: Independent    Ambulation Distance (Feet)  700 Feet    Assistive device  None    Ambulation Surface  Level    Gait velocity  1.18 m/s    Stairs  Yes    Stairs Assistance  6: Modified  independent (Device/Increase time)    Stair Management Technique  One rail Left;Other (comment) Reciprocal pattern    Number of Stairs  13    Height of Stairs  6  inches    Gait Comments  Patient ascended and descended stairs with reciprocal gait pattern and moderate use of single upper extremity      Static Standing Balance   Static Standing - Balance Support  No upper extremity supported    Static Standing - Level of Assistance  7: Independent    Static Standing Balance -  Activities   Single Leg Stance - Right Leg;Single Leg Stance - Left Leg    Static Standing - Comment/# of Minutes  SLS Rt.: 28 seconds; SLS Lt.: 21 seconds      Standardized Balance Assessment   Five times sit to stand comments   9.29 seconds                   [facility] Adult PT Treatment/Exercise - 01/18/18 0001      Knee/Hip Exercises: Stretches   Passive Hamstring Stretch  3 reps;30 seconds    Passive Hamstring Stretch Limitations  12" box    Knee: Self-Stretch to increase Flexion  Left;3 reps;30 seconds    Knee: Self-Stretch Limitations  12" box    Gastroc Stretch  Both;30 seconds;3 reps    Gastroc Stretch Limitations  On slant board       Knee/Hip Exercises: Supine   Short Arc Quad Sets  AROM;Strengthening;Left;2 sets;10 reps    Short Arc Quad Sets Limitations  Over basketball 3 seocond holds    Heel Slides  2 sets;10 reps    Heel Slides Limitations  5 second holds    Knee Extension  AROM    Knee Extension Limitations  5    Knee Flexion  AROM    Knee Flexion Limitations  118               PT Short Term Goals - 01/18/18 1335      PT SHORT TERM GOAL #1   Title  Patient will demonstrate understanding and report regular compliance with HEP.     Time  4    Period  Weeks    Status  On-going      PT SHORT TERM GOAL #2   Title  Patient will demonstrate left knee AROM extension/flexion of at least 5-95 degrees in order to assist patient with more normalized gait and stair ambulation.     Baseline  01/18/18: Patient has achieved 5-118 degrees of left knee extension/flexion AROM.     Time  4    Period  Weeks    Status  Achieved      PT SHORT TERM  GOAL #3   Title  Patient will demonstrate improvement of 1/2 MMT grade in all deficient tested musculature in order to assist patient in performing ambulation and stair ambulation with improved mechanics.     Baseline  01/18/18: Patient improved by at least 1/2 MMT grade with left hip extension, left hip abduction, and left knee flexion, but not in all deficient areas.     Time  4    Period  Weeks    Status  Partially Met        PT Long Term Goals - 01/18/18 1337      PT LONG TERM GOAL #1   Title  Patient will demonstrate left knee AROM extension/flexion of at least 0-120 degrees in order to assist patient with  ambulation, stairs, and squatting.     Baseline  01/18/18: Patient has achieved 5-118 degrees of left knee extension/flexion AROM.     Time  8    Period  Weeks    Status  On-going      PT LONG TERM GOAL #2   Title  Patient will demonstrate improvement of 1 MMT grade or be 5/5 in all deficient tested musculature in order to assist patient in performing ambulation and stair ambulation with improved mechanics.     Baseline  01/18/18: Patient continued to demonstrate strength less than 5/5 in some tested musculature.     Time  8    Period  Weeks    Status  Partially Met      PT LONG TERM GOAL #3   Title  Patient will report ability to sleep through the night for at least 3 nights over the course of 1 week without waking up and taking medication to get back to sleep.     Baseline  01/18/18: Patient reported she can sleep through the night on 4 out 7 nights of the week.    Time  8    Period  Weeks    Status  Achieved      PT LONG TERM GOAL #4   Title  Patient will demonstrate ability to perform the 5 times sit to stand test in 11 seconds or less indicating improved balance and decreased risk of falls.     Baseline  01/18/18: Patient performed the 5 times sit to stand in 9.29 seconds.     Time  8    Period  Weeks    Status  Achieved      PT LONG TERM GOAL #5   Title  Patient will  demonstrate ability to perform ascending and descending 13 stairs with single handrail with reciprocal gait pattern for improved mechanics and navigation of stairs at patient's home.     Baseline  01/18/18: Patient performed ascending and descending 13 stairs with single handrail with reciprocal gait pattern this session with moderate use of upper extremity support.     Time  8    Period  Weeks    Status  Achieved      PT LONG TERM GOAL #6   Title  Patient will report ability to stand for 20 minutes to allow for patient to prepare a meal at home.     Baseline  Patient reported she can stand for at least 20 minutes at this time.     Time  8    Period  Weeks    Status  Achieved            Plan - 01/18/18 1417    Clinical Impression Statement  This session performed a re-assessment of patient's progress toward goals. Patient has achieved 1 out of 3 of her short term goals and partially met 1 out of three of her short term goals. Patient has achieved 4 out of 6 of her long term goals and partially met 1 out of 6 of her long term goals. Patient is able to demonstrate between 5 and 118 degrees of left knee active range of motion extension and flexion. Patient has made improvements in strength, but continues to demonstrate some deficits in hip and knee strength. Remainder of session focused on improving patient's left knee range of motion through active exercises and passive stretches. Patient would benefit from continued skilled physical therapy to continue progress toward improving range of motion  with a focus on left knee extension range of motion, strength, and overall functional mobility.     Rehab Potential  Good    Clinical Impairments Affecting Rehab Potential  Positive: Motivated; Negative: history of left knee problems     PT Frequency  2x / week    PT Duration  8 weeks    PT Treatment/Interventions  ADLs/Self Care Home Management;Aquatic Therapy;Cryotherapy;Moist Heat;DME Instruction;Gait  training;Stair training;Functional mobility training;Therapeutic activities;Therapeutic exercise;Balance training;Neuromuscular re-education;Patient/family education;Orthotic Fit/Training;Manual techniques;Scar mobilization;Passive range of motion;Energy conservation;Taping    PT Next Visit Plan  Continue with manual therapy for edema reduction if edema is still present; initiate scar mobility see if pt has any questions on the scar desensitization. Measure ROM each session. Focus on knee extension range of motion.     PT Home Exercise Plan  12/23/17: Quad set with towel under heel 2 x 15 with 5 second hold 1x/day; SAQ 2 x 15 repetitions with 3 second holds 1x/day; Heel slide supine 2 x 15 5 second holds 1x/day;  Seated self-assisted heel slide 2 x 15 3 second holds 1x/week; 12/28/17 - SAQ with basketball; 01/13/18: Hamstring curls x 10 prone 1x/day    Consulted and Agree with Plan of Care  Patient       Patient will benefit from skilled therapeutic intervention in order to improve the following deficits and impairments:  Abnormal gait, Decreased activity tolerance, Decreased endurance, Decreased range of motion, Decreased strength, Hypomobility, Increased fascial restricitons, Impaired sensation, Pain, Improper body mechanics, Decreased balance, Decreased mobility, Decreased scar mobility, Difficulty walking, Increased edema, Impaired flexibility  Visit Diagnosis: Acute pain of left knee  Muscle weakness (generalized)  Other symptoms and signs involving the musculoskeletal system  Other abnormalities of gait and mobility     Problem List Patient Active Problem List   Diagnosis Date Noted  . Primary osteoarthritis of knee 12/06/2017  . Primary localized osteoarthritis of left knee 11/24/2017  . Acute medial meniscus tear of left knee   . Hypertension    Clarene Critchley PT, DPT 2:22 PM, 01/18/18 [place],  [place], [address] Phone: [phone]   Fax:  [phone]  Name: MERRI DIMAANO MRN: [id number] Date of Birth: 23-Nov-1955

## 2018-01-20 ENCOUNTER — Ambulatory Visit (HOSPITAL_COMMUNITY): Payer: 59 | Admitting: Physical Therapy

## 2018-01-20 ENCOUNTER — Encounter (HOSPITAL_COMMUNITY): Payer: Self-pay | Admitting: Physical Therapy

## 2018-01-20 DIAGNOSIS — M25562 Pain in left knee: Secondary | ICD-10-CM | POA: Diagnosis not present

## 2018-01-20 DIAGNOSIS — R29898 Other symptoms and signs involving the musculoskeletal system: Secondary | ICD-10-CM

## 2018-01-20 DIAGNOSIS — M6281 Muscle weakness (generalized): Secondary | ICD-10-CM

## 2018-01-20 DIAGNOSIS — R2689 Other abnormalities of gait and mobility: Secondary | ICD-10-CM

## 2018-01-20 NOTE — Therapy (Addendum)
[place], [place], [address] Phone: [phone]   Fax:  [phone]  Physical Therapy Treatment  Patient Details  Name: Nancy Steele MRN: [id number] Date of Birth: October 01, 1956 Referring Provider: Matthew Saras, PA-C   Encounter Date: 01/20/2018   PT End of Session - 01/18/18 1413    Visit Number  10   Number of Visits  17    Date for PT Re-Evaluation  02/18/18    Authorization Type  [employer]    Authorization Time Period  12/21/17 - 02/18/18    PT Start Time  1303    PT Stop Time  1345     PT Time Calculation (min)  42 min    Activity Tolerance  Patient tolerated treatment well    Behavior During Therapy  [facility] for tasks assessed/performed                Past Medical History:  Diagnosis Date  . Acute medial meniscus tear of left knee   . Allergy    seasonal  . Arthritis   . Blood transfusion without reported diagnosis    28 yrs ago  . Cataract    left eye-removal with implant   . DDD (degenerative disc disease), cervical   . Family history of adverse reaction to anesthesia    brother slow to wake up  . Glaucoma    glucoma like issue- cuased from past surgery increasing pressurein the eye   . Hypertension   . Primary localized osteoarthritis of left knee 11/24/2017    Past Surgical History:  Procedure Laterality Date  . CARPAL TUNNEL RELEASE     bilateral  . CERVICAL FUSION  2000  . CHONDROPLASTY Left 10/17/2014   Procedure: CHONDROPLASTY;  Surgeon: Lorn Junes, MD;  Location: [place];  Service: Orthopedics;  Laterality: Left;  . COLONOSCOPY    . KNEE ARTHROSCOPY     bilateral  . KNEE ARTHROSCOPY WITH LATERAL MENISECTOMY Left 10/17/2014   Procedure: KNEE ARTHROSCOPY WITH LATERAL MENISECTOMY;  Surgeon: Lorn Junes, MD;  Location: [place];  Service: Orthopedics;  Laterality: Left;  . KNEE ARTHROSCOPY WITH MEDIAL MENISECTOMY  Left 10/17/2014   Procedure: LEFT KNEE ARTHROSCOPY WITH MEDIAL MENISCECTOMY;  Surgeon: Lorn Junes, MD;  Location: [place];  Service: Orthopedics;  Laterality: Left;  . POLYPECTOMY    . TOTAL KNEE ARTHROPLASTY Left 12/06/2017   Procedure: LEFT TOTAL KNEE ARTHROPLASTY;  Surgeon: Elsie Saas, MD;  Location: [place];  Service: Orthopedics;  Laterality: Left;  . UPPER GI ENDOSCOPY     pt states she has never had an EGD in the past     There were no vitals filed for this visit.  Subjective Assessment - 01/20/18 1309    Subjective  Patient stated that she is not having any pain currently and that she has been doing her exercises focusing on straightening her knee.     Pertinent History  S/P left TKA 12/06/17    Limitations  Standing;Sitting;Walking;House hold activities    How long can you sit comfortably?  1.5 hours    How long can you stand comfortably?  20-25 minutes    How long can you walk comfortably?  30 minutes    Diagnostic tests  12/06/17: Left knee "satisfactory left knee replacement"    Patient Stated Goals  To get her leg straight; and sleep on side with knees curled up  Currently in Pain?  No/denies    Multiple Pain Sites  No                       [facility] Adult PT Treatment/Exercise - 01/20/18 0001      Knee/Hip Exercises: Stretches   Passive Hamstring Stretch  3 reps;30 seconds    Passive Hamstring Stretch Limitations  12" box    Knee: Self-Stretch to increase Flexion  Left;10 seconds;Limitations    Knee: Self-Stretch Limitations  12" box. 10 repetitions    Gastroc Stretch  Both;30 seconds;3 reps    Gastroc Stretch Limitations  On slant board       Knee/Hip Exercises: Standing   Heel Raises  Both;15 reps Bilateral upper extremity support    Knee Flexion  AROM;Strengthening;Left;Other (comment);10 reps;2 sets Standing hamstring curls    Terminal Knee Extension  Strengthening;Left;Theraband;10 reps;Other (comment) Blue theraband. 10  second holds.     Lateral Step Up  Step Height: 6";Left;Hand Hold: 1;15 reps    Forward Step Up  Step Height: 6";Hand Hold: 0;Left;15 reps    Step Down  Hand Hold: 1;Step Height: 6";Left;15 reps    Functional Squat  1 set;15 reps;Limitations Bilateral upper extremity support    Wall Squat  1 set;10 reps;5 seconds;Limitations Half squats    Rocker Board  2 minutes;Other (comment) Right and left only    SLS with Vectors  15" with intermittent upper extremity support forward, side, and back x 3 each standing on left lower extremity    Other Standing Knee Exercises  retro walking 14 feet x 4       Knee/Hip Exercises: Supine   Knee Extension  AROM    Knee Extension Limitations  3    Knee Flexion  AROM    Knee Flexion Limitations  118      Manual Therapy   Manual Therapy  Joint mobilization;Myofascial release    Manual therapy comments  All manual therapy completed separately from the rest of skilled intervention    Joint Mobilization  3 x 30 seconds AP/PA glides grade III to tibiofemoral joint left lower extremity to improve ROM    Myofascial Release  Scar massage to release adhesions                PT Short Term Goals - 01/18/18 1335      PT SHORT TERM GOAL #1   Title  Patient will demonstrate understanding and report regular compliance with HEP.     Time  4    Period  Weeks    Status  On-going      PT SHORT TERM GOAL #2   Title  Patient will demonstrate left knee AROM extension/flexion of at least 5-95 degrees in order to assist patient with more normalized gait and stair ambulation.     Baseline  01/18/18: Patient has achieved 5-118 degrees of left knee extension/flexion AROM.     Time  4    Period  Weeks    Status  Achieved      PT SHORT TERM GOAL #3   Title  Patient will demonstrate improvement of 1/2 MMT grade in all deficient tested musculature in order to assist patient in performing ambulation and stair ambulation with improved mechanics.     Baseline  01/18/18:  Patient improved by at least 1/2 MMT grade with left hip extension, left hip abduction, and left knee flexion, but not in all deficient areas.     Time  4  Period  Weeks    Status  Partially Met        PT Long Term Goals - 01/18/18 1337      PT LONG TERM GOAL #1   Title  Patient will demonstrate left knee AROM extension/flexion of at least 0-120 degrees in order to assist patient with ambulation, stairs, and squatting.     Baseline  01/18/18: Patient has achieved 5-118 degrees of left knee extension/flexion AROM.     Time  8    Period  Weeks    Status  On-going      PT LONG TERM GOAL #2   Title  Patient will demonstrate improvement of 1 MMT grade or be 5/5 in all deficient tested musculature in order to assist patient in performing ambulation and stair ambulation with improved mechanics.     Baseline  01/18/18: Patient continued to demonstrate strength less than 5/5 in some tested musculature.     Time  8    Period  Weeks    Status  Partially Met      PT LONG TERM GOAL #3   Title  Patient will report ability to sleep through the night for at least 3 nights over the course of 1 week without waking up and taking medication to get back to sleep.     Baseline  01/18/18: Patient reported she can sleep through the night on 4 out 7 nights of the week.    Time  8    Period  Weeks    Status  Achieved      PT LONG TERM GOAL #4   Title  Patient will demonstrate ability to perform the 5 times sit to stand test in 11 seconds or less indicating improved balance and decreased risk of falls.     Baseline  01/18/18: Patient performed the 5 times sit to stand in 9.29 seconds.     Time  8    Period  Weeks    Status  Achieved      PT LONG TERM GOAL #5   Title  Patient will demonstrate ability to perform ascending and descending 13 stairs with single handrail with reciprocal gait pattern for improved mechanics and navigation of stairs at patient's home.     Baseline  01/18/18: Patient performed ascending  and descending 13 stairs with single handrail with reciprocal gait pattern this session with moderate use of upper extremity support.     Time  8    Period  Weeks    Status  Achieved      PT LONG TERM GOAL #6   Title  Patient will report ability to stand for 20 minutes to allow for patient to prepare a meal at home.     Baseline  Patient reported she can stand for at least 20 minutes at this time.     Time  8    Period  Weeks    Status  Achieved            Plan - 01/20/18 1521    Clinical Impression Statement  This session focused on improving patient's knee extension range of motion. This session also continued to progress patient with functional strengthening increasing the number of repetitions and adding half wall squats and retro ambulation this session. Patient required verbal and tactile cues for form with squatting. At end of session therapist performed manual therapy to improve patient's joint mobility and decrease scar adhesions for 8 minutes total. At the end of the session  patient's knee range of motion had improved to 3 degrees of extension, but patient's knee remained at 118 degrees of knee flexion AROM. Patient would benefit from skilled physical therapy in order to continue progress toward goals.     Rehab Potential  Good    Clinical Impairments Affecting Rehab Potential  Positive: Motivated; Negative: history of left knee problems     PT Frequency  2x / week    PT Duration  8 weeks    PT Treatment/Interventions  ADLs/Self Care Home Management;Aquatic Therapy;Cryotherapy;Moist Heat;DME Instruction;Gait training;Stair training;Functional mobility training;Therapeutic activities;Therapeutic exercise;Balance training;Neuromuscular re-education;Patient/family education;Orthotic Fit/Training;Manual techniques;Scar mobilization;Passive range of motion;Energy conservation;Taping    PT Next Visit Plan  Continue scar mobility see if pt has any questions on the scar desensitization.  Measure ROM each session. Focus on knee extension range of motion.     PT Home Exercise Plan  12/23/17: Quad set with towel under heel 2 x 15 with 5 second hold 1x/day; SAQ 2 x 15 repetitions with 3 second holds 1x/day; Heel slide supine 2 x 15 5 second holds 1x/day;  Seated self-assisted heel slide 2 x 15 3 second holds 1x/week; 12/28/17 - SAQ with basketball; 01/13/18: Hamstring curls x 10 prone 1x/day    Consulted and Agree with Plan of Care  Patient       Patient will benefit from skilled therapeutic intervention in order to improve the following deficits and impairments:  Abnormal gait, Decreased activity tolerance, Decreased endurance, Decreased range of motion, Decreased strength, Hypomobility, Increased fascial restricitons, Impaired sensation, Pain, Improper body mechanics, Decreased balance, Decreased mobility, Decreased scar mobility, Difficulty walking, Increased edema, Impaired flexibility  Visit Diagnosis: Acute pain of left knee  Muscle weakness (generalized)  Other symptoms and signs involving the musculoskeletal system  Other abnormalities of gait and mobility     Problem List Patient Active Problem List   Diagnosis Date Noted  . Primary osteoarthritis of knee 12/06/2017  . Primary localized osteoarthritis of left knee 11/24/2017  . Acute medial meniscus tear of left knee   . Hypertension      Clarene Critchley PT, DPT 3:23 PM, 01/20/18 [place], [place], [address] Phone: [phone]   Fax:  [phone]  Name: Nancy Steele MRN: [id number] Date of Birth: 1956-05-23

## 2018-01-25 ENCOUNTER — Ambulatory Visit (HOSPITAL_COMMUNITY): Payer: 59 | Admitting: Physical Therapy

## 2018-01-25 DIAGNOSIS — R2689 Other abnormalities of gait and mobility: Secondary | ICD-10-CM

## 2018-01-25 DIAGNOSIS — M25562 Pain in left knee: Secondary | ICD-10-CM

## 2018-01-25 DIAGNOSIS — R29898 Other symptoms and signs involving the musculoskeletal system: Secondary | ICD-10-CM

## 2018-01-25 DIAGNOSIS — M6281 Muscle weakness (generalized): Secondary | ICD-10-CM

## 2018-01-25 NOTE — Therapy (Signed)
[place], [place], [address] Phone: [phone]   Fax:  [phone]  Physical Therapy Treatment  Patient Details  Name: Nancy Steele MRN: [id number] Date of Birth: 03-16-1956 Referring Provider: Matthew Saras, PA-C   Encounter Date: 01/25/2018  PT End of Session - 01/25/18 1456    Visit Number  11    Number of Visits  17    Date for PT Re-Evaluation  02/18/18    Authorization Type  [employer]    Authorization Time Period  12/21/17 - 02/18/18    PT Start Time  1347    PT Stop Time  1430    PT Time Calculation (min)  43 min    Activity Tolerance  Patient tolerated treatment well    Behavior During Therapy  [facility] for tasks assessed/performed       Past Medical History:  Diagnosis Date  . Acute medial meniscus tear of left knee   . Allergy    seasonal  . Arthritis   . Blood transfusion without reported diagnosis    28 yrs ago  . Cataract    left eye-removal with implant   . DDD (degenerative disc disease), cervical   . Family history of adverse reaction to anesthesia    brother slow to wake up  . Glaucoma    glucoma like issue- cuased from past surgery increasing pressurein the eye   . Hypertension   . Primary localized osteoarthritis of left knee 11/24/2017    Past Surgical History:  Procedure Laterality Date  . CARPAL TUNNEL RELEASE     bilateral  . CERVICAL FUSION  2000  . CHONDROPLASTY Left 10/17/2014   Procedure: CHONDROPLASTY;  Surgeon: Lorn Junes, MD;  Location: [place];  Service: Orthopedics;  Laterality: Left;  . COLONOSCOPY    . KNEE ARTHROSCOPY     bilateral  . KNEE ARTHROSCOPY WITH LATERAL MENISECTOMY Left 10/17/2014   Procedure: KNEE ARTHROSCOPY WITH LATERAL MENISECTOMY;  Surgeon: Lorn Junes, MD;  Location: [place];  Service: Orthopedics;  Laterality: Left;  . KNEE ARTHROSCOPY WITH MEDIAL MENISECTOMY Left 10/17/2014   Procedure:  LEFT KNEE ARTHROSCOPY WITH MEDIAL MENISCECTOMY;  Surgeon: Lorn Junes, MD;  Location: [place];  Service: Orthopedics;  Laterality: Left;  . POLYPECTOMY    . TOTAL KNEE ARTHROPLASTY Left 12/06/2017   Procedure: LEFT TOTAL KNEE ARTHROPLASTY;  Surgeon: Elsie Saas, MD;  Location: [place];  Service: Orthopedics;  Laterality: Left;  . UPPER GI ENDOSCOPY     pt states she has never had an EGD in the past     There were no vitals filed for this visit.  Subjective Assessment - 01/25/18 1353    Subjective  Pt states she returned to [place] last week and is doing well overall.  STates she is stiff but not hurting.  STates she went shoppign with her husband this morning.     Currently in Pain?  No/denies                       [facility] Adult PT Treatment/Exercise - 01/25/18 0001      Knee/Hip Exercises: Stretches   Passive Hamstring Stretch  3 reps;30 seconds    Passive Hamstring Stretch Limitations  12" box    Knee: Self-Stretch to increase Flexion  Left;10 seconds;Limitations    Knee: Self-Stretch Limitations  12" box. 10 repetitions    Gastroc Stretch  Both;30  seconds;3 reps    Gastroc Stretch Limitations  On slant board       Knee/Hip Exercises: Standing   Heel Raises  Both;15 reps    Knee Flexion  AROM;Strengthening;Left;Other (comment);10 reps;2 sets    Forward Lunges  Both;Limitations;15 reps    Forward Lunges Limitations  4" step no UE's    Functional Squat  1 set;15 reps;Limitations    Stairs  5 RT 7" height no HR's    SLS with Vectors  15" with intermittent upper extremity support forward, side, and back x 5 each standing on left lower extremity    Other Standing Knee Exercises  balance beam 2RT each tandem, retro tandem and side stepping      Knee/Hip Exercises: Supine   Knee Extension  AROM;Left    Knee Extension Limitations  3 (Rt is 3)    Knee Flexion  AROM;Left    Knee Flexion Limitations  120 (Rt is 125)               PT Short Term  Goals - 01/18/18 1335      PT SHORT TERM GOAL #1   Title  Patient will demonstrate understanding and report regular compliance with HEP.     Time  4    Period  Weeks    Status  On-going      PT SHORT TERM GOAL #2   Title  Patient will demonstrate left knee AROM extension/flexion of at least 5-95 degrees in order to assist patient with more normalized gait and stair ambulation.     Baseline  01/18/18: Patient has achieved 5-118 degrees of left knee extension/flexion AROM.     Time  4    Period  Weeks    Status  Achieved      PT SHORT TERM GOAL #3   Title  Patient will demonstrate improvement of 1/2 MMT grade in all deficient tested musculature in order to assist patient in performing ambulation and stair ambulation with improved mechanics.     Baseline  01/18/18: Patient improved by at least 1/2 MMT grade with left hip extension, left hip abduction, and left knee flexion, but not in all deficient areas.     Time  4    Period  Weeks    Status  Partially Met        PT Long Term Goals - 01/18/18 1337      PT LONG TERM GOAL #1   Title  Patient will demonstrate left knee AROM extension/flexion of at least 0-120 degrees in order to assist patient with ambulation, stairs, and squatting.     Baseline  01/18/18: Patient has achieved 5-118 degrees of left knee extension/flexion AROM.     Time  8    Period  Weeks    Status  On-going      PT LONG TERM GOAL #2   Title  Patient will demonstrate improvement of 1 MMT grade or be 5/5 in all deficient tested musculature in order to assist patient in performing ambulation and stair ambulation with improved mechanics.     Baseline  01/18/18: Patient continued to demonstrate strength less than 5/5 in some tested musculature.     Time  8    Period  Weeks    Status  Partially Met      PT LONG TERM GOAL #3   Title  Patient will report ability to sleep through the night for at least 3 nights over the course of 1 week without waking  up and taking medication  to get back to sleep.     Baseline  01/18/18: Patient reported she can sleep through the night on 4 out 7 nights of the week.    Time  8    Period  Weeks    Status  Achieved      PT LONG TERM GOAL #4   Title  Patient will demonstrate ability to perform the 5 times sit to stand test in 11 seconds or less indicating improved balance and decreased risk of falls.     Baseline  01/18/18: Patient performed the 5 times sit to stand in 9.29 seconds.     Time  8    Period  Weeks    Status  Achieved      PT LONG TERM GOAL #5   Title  Patient will demonstrate ability to perform ascending and descending 13 stairs with single handrail with reciprocal gait pattern for improved mechanics and navigation of stairs at patient's home.     Baseline  01/18/18: Patient performed ascending and descending 13 stairs with single handrail with reciprocal gait pattern this session with moderate use of upper extremity support.     Time  8    Period  Weeks    Status  Achieved      PT LONG TERM GOAL #6   Title  Patient will report ability to stand for 20 minutes to allow for patient to prepare a meal at home.     Baseline  Patient reported she can stand for at least 20 minutes at this time.     Time  8    Period  Weeks    Status  Achieved            Plan - 01/25/18 1602    Clinical Impression Statement  continued POC with ROM focus.  discussed limitations with patient who only c/o swelling at end of day and some discomfort with descending stairs.  Pt voiced readiness to be discharged at next visit.  manual completed to knee with minimal tightness noted.  ROM measurements reveal bilateral LE's are equal in extension, lacking 3 degrees from neutral and flexion is 125 for Rt and 120 for Lt.  Pt is overall progressing well.      Rehab Potential  Good    Clinical Impairments Affecting Rehab Potential  Positive: Motivated; Negative: history of left knee problems     PT Frequency  2x / week    PT Duration  8 weeks    PT  Treatment/Interventions  ADLs/Self Care Home Management;Aquatic Therapy;Cryotherapy;Moist Heat;DME Instruction;Gait training;Stair training;Functional mobility training;Therapeutic activities;Therapeutic exercise;Balance training;Neuromuscular re-education;Patient/family education;Orthotic Fit/Training;Manual techniques;Scar mobilization;Passive range of motion;Energy conservation;Taping    PT Next Visit Plan  Re-evaluate next session wtih possible plan to discharge per patient request and goals met.      PT Home Exercise Plan  12/23/17: Quad set with towel under heel 2 x 15 with 5 second hold 1x/day; SAQ 2 x 15 repetitions with 3 second holds 1x/day; Heel slide supine 2 x 15 5 second holds 1x/day;  Seated self-assisted heel slide 2 x 15 3 second holds 1x/week; 12/28/17 - SAQ with basketball; 01/13/18: Hamstring curls x 10 prone 1x/day    Consulted and Agree with Plan of Care  Patient       Patient will benefit from skilled therapeutic intervention in order to improve the following deficits and impairments:  Abnormal gait, Decreased activity tolerance, Decreased endurance, Decreased range of motion, Decreased strength, Hypomobility,  Increased fascial restricitons, Impaired sensation, Pain, Improper body mechanics, Decreased balance, Decreased mobility, Decreased scar mobility, Difficulty walking, Increased edema, Impaired flexibility  Visit Diagnosis: Acute pain of left knee  Muscle weakness (generalized)  Other symptoms and signs involving the musculoskeletal system  Other abnormalities of gait and mobility     Problem List Patient Active Problem List   Diagnosis Date Noted  . Primary osteoarthritis of knee 12/06/2017  . Primary localized osteoarthritis of left knee 11/24/2017  . Acute medial meniscus tear of left knee   . Hypertension    Teena Irani, PTA/CLT [phone]   Teena Irani 01/25/2018, 4:08 PM  [place], [place], [address] Phone: [phone]   Fax:  [phone]  Name: Nancy Steele MRN: [id number] Date of Birth: 04-29-1956

## 2018-01-27 ENCOUNTER — Encounter (HOSPITAL_COMMUNITY): Payer: Self-pay | Admitting: Physical Therapy

## 2018-01-27 ENCOUNTER — Ambulatory Visit (HOSPITAL_COMMUNITY): Payer: 59 | Admitting: Physical Therapy

## 2018-01-27 DIAGNOSIS — H2511 Age-related nuclear cataract, right eye: Secondary | ICD-10-CM | POA: Diagnosis not present

## 2018-01-27 DIAGNOSIS — M25562 Pain in left knee: Secondary | ICD-10-CM | POA: Diagnosis not present

## 2018-01-27 DIAGNOSIS — R2689 Other abnormalities of gait and mobility: Secondary | ICD-10-CM

## 2018-01-27 DIAGNOSIS — H04123 Dry eye syndrome of bilateral lacrimal glands: Secondary | ICD-10-CM | POA: Diagnosis not present

## 2018-01-27 DIAGNOSIS — H40052 Ocular hypertension, left eye: Secondary | ICD-10-CM | POA: Diagnosis not present

## 2018-01-27 DIAGNOSIS — R29898 Other symptoms and signs involving the musculoskeletal system: Secondary | ICD-10-CM

## 2018-01-27 DIAGNOSIS — M6281 Muscle weakness (generalized): Secondary | ICD-10-CM

## 2018-01-27 NOTE — Patient Instructions (Signed)
  WALL SQUATS Leaning up against a wall or closed door on your back, slide your body downward and then return back to upright position. A door was used here because it was smoother and had less friction than the wall. Knees should bend in line with the 2nd toe and not pass the front of the foot. Hold for 5 seconds x 10  Repeat 10 Times Hold 5 Seconds Complete 1 Set Perform 3 Time(s) a Week

## 2018-01-27 NOTE — Therapy (Signed)
[facility] [address] [place], [place], [address] Phone: [phone]   Fax:  [phone]  Physical Therapy Treatment / Re-assessment / Discharge summary  Patient Details  Name: Nancy Steele MRN: [id number] Date of Birth: [birth year] Referring Provider: Matthew Saras, PA-C   Encounter Date: 01/27/2018  PT End of Session - 01/27/18 1346    Visit Number  12    Number of Visits  17    Date for PT Re-Evaluation  02/18/18    Authorization Type  [employer]    Authorization Time Period  12/21/17 - 02/18/18    PT Start Time  1301    PT Stop Time  1335    PT Time Calculation (min)  34 min    Activity Tolerance  Patient tolerated treatment well    Behavior During Therapy  [facility] for tasks assessed/performed       Past Medical History:  Diagnosis Date  . Acute medial meniscus tear of left knee   . Allergy    seasonal  . Arthritis   . Blood transfusion without reported diagnosis    28 yrs ago  . Cataract    left eye-removal with implant   . DDD (degenerative disc disease), cervical   . Family history of adverse reaction to anesthesia    brother slow to wake up  . Glaucoma    glucoma like issue- cuased from past surgery increasing pressurein the eye   . Hypertension   . Primary localized osteoarthritis of left knee 11/24/2017    Past Surgical History:  Procedure Laterality Date  . CARPAL TUNNEL RELEASE     bilateral  . CERVICAL FUSION  2000  . CHONDROPLASTY Left 10/17/2014   Procedure: CHONDROPLASTY;  Surgeon: Lorn Junes, MD;  Location: [place];  Service: Orthopedics;  Laterality: Left;  . COLONOSCOPY    . KNEE ARTHROSCOPY     bilateral  . KNEE ARTHROSCOPY WITH LATERAL MENISECTOMY Left 10/17/2014   Procedure: KNEE ARTHROSCOPY WITH LATERAL MENISECTOMY;  Surgeon: Lorn Junes, MD;  Location: [place];  Service: Orthopedics;  Laterality: Left;  . KNEE ARTHROSCOPY WITH MEDIAL  MENISECTOMY Left 10/17/2014   Procedure: LEFT KNEE ARTHROSCOPY WITH MEDIAL MENISCECTOMY;  Surgeon: Lorn Junes, MD;  Location: [place];  Service: Orthopedics;  Laterality: Left;  . POLYPECTOMY    . TOTAL KNEE ARTHROPLASTY Left 12/06/2017   Procedure: LEFT TOTAL KNEE ARTHROPLASTY;  Surgeon: Elsie Saas, MD;  Location: [place];  Service: Orthopedics;  Laterality: Left;  . UPPER GI ENDOSCOPY     pt states she has never had an EGD in the past     There were no vitals filed for this visit.  Subjective Assessment - 01/27/18 1303    Subjective  Patient reported that she is feeling ready to be discharged and that at rest her knee doesn't hurt it just hurts some when standing on it.     Pertinent History  S/P left TKA 12/06/17    Limitations  Standing;Walking;Sitting    How long can you sit comfortably?  1.5 hours    How long can you stand comfortably?  25 minutes    How long can you walk comfortably?  30 minutes    Diagnostic tests  12/06/17: Left knee "satisfactory left knee replacement"    Patient Stated Goals  To get her leg straight; and sleep on side with knees curled up     Currently in Pain?  No/denies         [facility] PT Assessment - 01/27/18 0001      Assessment   Medical Diagnosis  Primary localized osteoarthritis of left knee; S/p left TKA    Referring Provider  Matthew Saras, PA-C    Onset Date/Surgical Date  12/06/17    Prior Therapy  Yes, home health PT      Restrictions   Weight Bearing Restrictions  Yes    LLE Weight Bearing  Weight bearing as tolerated      Prior Function   Level of Independence  Independent      Cognition   Overall Cognitive Status  Within Functional Limits for tasks assessed      Observation/Other Assessments   Observations  Patient's incision appeared clean and intact, no redness noted    Focus on Therapeutic Outcomes (FOTO)   46% (54% limited)      Circumferential Edema   Circumferential - Right  17 inches  midpatellar    Circumferential - Left   17 and 1/8 inch Mid patellar      Sensation   Light Touch  Impaired by gross assessment    Additional Comments  Patient reported all areas of left lower leg felt normal except on the lateral side of her left knee      AROM   Right Knee Extension  1    Right Knee Flexion  125    Left Knee Extension  1    Left Knee Flexion  120      Strength   Right Hip Flexion  5/5    Right Hip Extension  5/5    Right Hip ABduction  5/5    Left Hip Flexion  5/5    Left Hip Extension  5/5    Left Hip ABduction  5/5    Right Knee Flexion  5/5    Right Knee Extension  5/5    Left Knee Flexion  5/5    Left Knee Extension  5/5    Right Ankle Dorsiflexion  5/5    Left Ankle Dorsiflexion  5/5      Palpation   Palpation comment  Patient denied any tenderness around the left knee with palpation this session.       Ambulation/Gait   Ambulation/Gait  Yes    Ambulation/Gait Assistance  7: Independent    Assistive device  None    Ambulation Surface  Level    Gait velocity  1.29 m/s    Stairs  Yes    Stairs Assistance  6: Modified independent (Device/Increase time)    Stair Management Technique  One rail Left;Other (comment) reciprocal pattern    Number of Stairs  13    Height of Stairs  6 inches    Gait Comments  Patient ascended and descended stairs with reciprocal gait pattern and minimal use of single upper extremity      Static Standing Balance   Static Standing - Balance Support  No upper extremity supported    Static Standing - Level of Assistance  7: Independent    Static Standing Balance -  Activities   Single Leg Stance - Right Leg;Single Leg Stance - Left Leg    Static Standing - Comment/# of Minutes  SLS Rt. 47.90 seconds; SLS Lt. 34.65 seconds      Standardized Balance Assessment   Five times sit to stand comments   8.26 seconds without upper extremities from standard chair  [facility] PT Treatment/Exercise -  01/27/18 0001      Ambulation/Gait   Ambulation Distance (Feet)  760 Feet 3MWT      Knee/Hip Exercises: Standing   Wall Squat  1 set;10 reps;5 seconds;Limitations;Other (comment) for home exercise program, half squats             PT Education - 01/27/18 1345    Education provided  Yes    Education Details  Patient was educated on updated home exercise program, results of re-assessment and how to contact the clinic if needed.     Person(s) Educated  Patient    Methods  Explanation;Demonstration;Handout;Verbal cues    Comprehension  Verbalized understanding;Returned demonstration       PT Short Term Goals - 01/27/18 1322      PT SHORT TERM GOAL #1   Title  Patient will demonstrate understanding and report regular compliance with HEP.     Time  4    Period  Weeks    Status  Achieved      PT SHORT TERM GOAL #2   Title  Patient will demonstrate left knee AROM extension/flexion of at least 5-95 degrees in order to assist patient with more normalized gait and stair ambulation.     Baseline  01/27/18: Patient has achieved 1-120 degrees of left knee extension/flexion AROM.     Time  4    Period  Weeks    Status  Achieved      PT SHORT TERM GOAL #3   Title  Patient will demonstrate improvement of 1/2 MMT grade in all deficient tested musculature in order to assist patient in performing ambulation and stair ambulation with improved mechanics.     Baseline  01/27/18: Patient has 5/5 MMT grade strength in all tested musculature.     Time  4    Period  Weeks    Status  Achieved        PT Long Term Goals - 01/27/18 1323      PT LONG TERM GOAL #1   Title  Patient will demonstrate left knee AROM extension/flexion of at least 0-120 degrees in order to assist patient with ambulation, stairs, and squatting.     Baseline  01/27/18: Patient has achieved 1-120 degrees of left knee extension/flexion AROM however, it is equal to what is measured on the right side.     Time  8    Period   Weeks    Status  Partially Met      PT LONG TERM GOAL #2   Title  Patient will demonstrate improvement of 1 MMT grade or be 5/5 in all deficient tested musculature in order to assist patient in performing ambulation and stair ambulation with improved mechanics.     Baseline  01/27/18: Patient demonstrated 5/5 MMT grade strength in all tested musculature.     Time  8    Period  Weeks    Status  Achieved      PT LONG TERM GOAL #3   Title  Patient will report ability to sleep through the night for at least 3 nights over the course of 1 week without waking up and taking medication to get back to sleep.     Baseline  01/27/18: Patient reported she can sleep through the night on 4 or 5 out 7 nights of the week.    Time  8    Period  Weeks    Status  Achieved      PT  LONG TERM GOAL #4   Title  Patient will demonstrate ability to perform the 5 times sit to stand test in 11 seconds or less indicating improved balance and decreased risk of falls.     Baseline  01/27/18: Patient performed the 5 times sit to stand in 8.26 seconds.     Time  8    Period  Weeks    Status  Achieved      PT LONG TERM GOAL #5   Title  Patient will demonstrate ability to perform ascending and descending 13 stairs with single handrail with reciprocal gait pattern for improved mechanics and navigation of stairs at patient's home.     Baseline  01/27/18: Patient performed ascending and descending 13 stairs with single handrail with reciprocal gait pattern this session with minimal use of upper extremity support.     Time  8    Period  Weeks    Status  Achieved      PT LONG TERM GOAL #6   Title  Patient will report ability to stand for 20 minutes to allow for patient to prepare a meal at home.     Baseline  01/27/18: Patient reported she can stand for at least 20 minutes at this time.     Time  8    Period  Weeks    Status  Achieved            Plan - 01/27/18 1444    Clinical Impression Statement  This session  performed a re-assessment as patient has demonstrated excellent progress toward all goals and states her agreement that she feels she is ready for discharge from therapy. Patient has achieved 3 out of 3 of her short term goals. Patient has achieved 5 out of 6 of her long term goals. Patient did not meet one long term goal because she was lacking 1 degree of extension, however patient was lacking 1 degree of extension on the other leg also with this assessment. Patient is demonstrating 5/5 strength in all tested musculature, she is able to safely navigate stairs with reciprocal pattern and single upper extremity support. Patient ambulated on the 3 minute walk test at over a 1.2 m/s velocity. Therapist reviewed patient's home exercise program this session and added wall squats to patient's exercise program to continue practice at home. Therapist provided patient on information about the senior center and stated that patient may call the clinic at any time with any questions or concerns. Patient is being discharged at this time as she has met nearly all goals and is pleased with her current functional status.     Rehab Potential  Good    Clinical Impairments Affecting Rehab Potential  Positive: Motivated; Negative: history of left knee problems     PT Frequency  2x / week    PT Duration  8 weeks    PT Treatment/Interventions  ADLs/Self Care Home Management;Aquatic Therapy;Cryotherapy;Moist Heat;DME Instruction;Gait training;Stair training;Functional mobility training;Therapeutic activities;Therapeutic exercise;Balance training;Neuromuscular re-education;Patient/family education;Orthotic Fit/Training;Manual techniques;Scar mobilization;Passive range of motion;Energy conservation;Taping    PT Next Visit Plan  Patient is being discharged at this visit    PT Home Exercise Plan  12/23/17: Quad set with towel under heel 2 x 15 with 5 second hold 1x/day; SAQ 2 x 15 repetitions with 3 second holds 1x/day; Heel slide  supine 2 x 15 5 second holds 1x/day;  Seated self-assisted heel slide 2 x 15 3 second holds 1x/week; 12/28/17 - SAQ with basketball; 01/13/18: Hamstring curls x 10  prone 1x/day; 01/27/18: Wall squats x 10 5 second holds 3x/week    Consulted and Agree with Plan of Care  Patient       Patient will benefit from skilled therapeutic intervention in order to improve the following deficits and impairments:  Abnormal gait, Decreased activity tolerance, Decreased endurance, Decreased range of motion, Decreased strength, Hypomobility, Increased fascial restricitons, Impaired sensation, Pain, Improper body mechanics, Decreased balance, Decreased mobility, Decreased scar mobility, Difficulty walking, Increased edema, Impaired flexibility  Visit Diagnosis: Acute pain of left knee  Muscle weakness (generalized)  Other symptoms and signs involving the musculoskeletal system  Other abnormalities of gait and mobility    PHYSICAL THERAPY DISCHARGE SUMMARY  Visits from Start of Care: 12  Current functional level related to goals / functional outcomes: See above   Remaining deficits: See above   Education / Equipment: Updated HEP, discussed patient going to senior center and continuing exercises at home as well as how to contact clinic if needed.  Plan: Patient agrees to discharge.  Patient goals were partially met. Patient is being discharged due to being pleased with the current functional level.  ????? and meeting the majority of all goals         Problem List Patient Active Problem List   Diagnosis Date Noted  . Primary osteoarthritis of knee 12/06/2017  . Primary localized osteoarthritis of left knee 11/24/2017  . Acute medial meniscus tear of left knee   . Hypertension    Clarene Critchley PT, DPT 2:56 PM, 01/27/18 [place], [place], [address] Phone: [phone]   Fax:  [phone]  Name: Nancy Steele MRN: [id number] Date of Birth: 1956-06-26

## 2018-02-01 ENCOUNTER — Encounter (HOSPITAL_COMMUNITY): Payer: 59 | Admitting: Physical Therapy

## 2018-02-01 ENCOUNTER — Other Ambulatory Visit: Payer: 59

## 2018-02-01 ENCOUNTER — Other Ambulatory Visit: Payer: Self-pay | Admitting: Family Medicine

## 2018-02-01 DIAGNOSIS — Z79899 Other long term (current) drug therapy: Secondary | ICD-10-CM

## 2018-02-01 DIAGNOSIS — I10 Essential (primary) hypertension: Secondary | ICD-10-CM | POA: Diagnosis not present

## 2018-02-01 DIAGNOSIS — E782 Mixed hyperlipidemia: Secondary | ICD-10-CM

## 2018-02-01 LAB — COMPREHENSIVE METABOLIC PANEL
AG Ratio: 2.2 (calc) (ref 1.0–2.5)
ALBUMIN MSPROF: 4.6 g/dL (ref 3.6–5.1)
ALT: 10 U/L (ref 6–29)
AST: 13 U/L (ref 10–35)
Alkaline phosphatase (APISO): 76 U/L (ref 33–130)
BILIRUBIN TOTAL: 0.7 mg/dL (ref 0.2–1.2)
BUN: 17 mg/dL (ref 7–25)
CALCIUM: 9.6 mg/dL (ref 8.6–10.4)
CHLORIDE: 102 mmol/L (ref 98–110)
CO2: 30 mmol/L (ref 20–32)
Creat: 0.79 mg/dL (ref 0.50–1.05)
GLOBULIN: 2.1 g/dL (ref 1.9–3.7)
Glucose, Bld: 97 mg/dL (ref 65–99)
POTASSIUM: 4.7 mmol/L (ref 3.5–5.3)
Sodium: 139 mmol/L (ref 135–146)
Total Protein: 6.7 g/dL (ref 6.1–8.1)

## 2018-02-01 LAB — CBC WITH DIFFERENTIAL/PLATELET
BASOS PCT: 0.7 %
Basophils Absolute: 42 cells/uL (ref 0–200)
EOS ABS: 162 {cells}/uL (ref 15–500)
Eosinophils Relative: 2.7 %
HEMATOCRIT: 36.7 % (ref 35.0–45.0)
Hemoglobin: 12.7 g/dL (ref 11.7–15.5)
LYMPHS ABS: 1830 {cells}/uL (ref 850–3900)
MCH: 31.2 pg (ref 27.0–33.0)
MCHC: 34.6 g/dL (ref 32.0–36.0)
MCV: 90.2 fL (ref 80.0–100.0)
MPV: 11.4 fL (ref 7.5–12.5)
Monocytes Relative: 8.2 %
NEUTROS PCT: 57.9 %
Neutro Abs: 3474 cells/uL (ref 1500–7800)
PLATELETS: 248 10*3/uL (ref 140–400)
RBC: 4.07 10*6/uL (ref 3.80–5.10)
RDW: 11.6 % (ref 11.0–15.0)
TOTAL LYMPHOCYTE: 30.5 %
WBC: 6 10*3/uL (ref 3.8–10.8)
WBCMIX: 492 {cells}/uL (ref 200–950)

## 2018-02-01 LAB — LIPID PANEL
CHOLESTEROL: 211 mg/dL — AB (ref <200)
HDL: 71 mg/dL (ref >50)
LDL Cholesterol (Calc): 124 mg/dL (calc) — ABNORMAL HIGH
Non-HDL Cholesterol (Calc): 140 mg/dL (calc) — ABNORMAL HIGH (ref <130)
Total CHOL/HDL Ratio: 3 (calc) (ref <5.0)
Triglycerides: 67 mg/dL (ref <150)

## 2018-02-03 ENCOUNTER — Encounter (HOSPITAL_COMMUNITY): Payer: 59 | Admitting: Physical Therapy

## 2018-02-07 ENCOUNTER — Encounter (INDEPENDENT_AMBULATORY_CARE_PROVIDER_SITE_OTHER): Payer: Self-pay

## 2018-02-08 ENCOUNTER — Encounter (HOSPITAL_COMMUNITY): Payer: 59 | Admitting: Physical Therapy

## 2018-02-10 ENCOUNTER — Ambulatory Visit (HOSPITAL_COMMUNITY): Payer: 59 | Admitting: Physical Therapy

## 2018-05-07 ENCOUNTER — Other Ambulatory Visit: Payer: Self-pay | Admitting: Family Medicine

## 2018-05-31 DIAGNOSIS — M1712 Unilateral primary osteoarthritis, left knee: Secondary | ICD-10-CM | POA: Diagnosis not present

## 2018-06-09 DIAGNOSIS — H04123 Dry eye syndrome of bilateral lacrimal glands: Secondary | ICD-10-CM | POA: Diagnosis not present

## 2018-06-09 DIAGNOSIS — H2511 Age-related nuclear cataract, right eye: Secondary | ICD-10-CM | POA: Diagnosis not present

## 2018-06-09 DIAGNOSIS — H40052 Ocular hypertension, left eye: Secondary | ICD-10-CM | POA: Diagnosis not present

## 2018-09-28 ENCOUNTER — Ambulatory Visit (INDEPENDENT_AMBULATORY_CARE_PROVIDER_SITE_OTHER): Payer: 59 | Admitting: *Deleted

## 2018-09-28 DIAGNOSIS — Z23 Encounter for immunization: Secondary | ICD-10-CM | POA: Diagnosis not present

## 2018-09-29 DIAGNOSIS — M1712 Unilateral primary osteoarthritis, left knee: Secondary | ICD-10-CM | POA: Diagnosis not present

## 2018-09-29 DIAGNOSIS — M25561 Pain in right knee: Secondary | ICD-10-CM | POA: Diagnosis not present

## 2018-10-05 DIAGNOSIS — H401122 Primary open-angle glaucoma, left eye, moderate stage: Secondary | ICD-10-CM | POA: Diagnosis not present

## 2018-10-05 DIAGNOSIS — H2511 Age-related nuclear cataract, right eye: Secondary | ICD-10-CM | POA: Diagnosis not present

## 2018-10-05 DIAGNOSIS — H04123 Dry eye syndrome of bilateral lacrimal glands: Secondary | ICD-10-CM | POA: Diagnosis not present

## 2018-10-24 ENCOUNTER — Ambulatory Visit: Payer: 59 | Admitting: Family Medicine

## 2018-10-24 ENCOUNTER — Encounter: Payer: Self-pay | Admitting: Family Medicine

## 2018-10-24 VITALS — BP 122/74 | HR 74 | Temp 97.8°F | Resp 16 | Ht 68.0 in | Wt 210.0 lb

## 2018-10-24 DIAGNOSIS — E782 Mixed hyperlipidemia: Secondary | ICD-10-CM

## 2018-10-24 DIAGNOSIS — Z01818 Encounter for other preprocedural examination: Secondary | ICD-10-CM

## 2018-10-24 DIAGNOSIS — I10 Essential (primary) hypertension: Secondary | ICD-10-CM | POA: Diagnosis not present

## 2018-10-24 NOTE — Progress Notes (Signed)
Subjective:    Patient ID: Nancy Steele, female    DOB: 31-Jul-1956, 63 y.o.   MRN: [id number]  HPI Patient is here today for medical clearance for her upcoming right knee replacement.  She has not been seen since November 2018.  Therefore I requested an office visit prior to granting surgical clearance.  Past medical history is significant for hypertension for which she takes lisinopril 10 mg poqday.  She also has borderline hyperlipidemia.  She has been doing well.  She denies any medical problems other than some recent vertigo.  The vertigo began approximately 1 week ago.  It is positional in nature.  It usually resolves after a few minutes of lying still.  It primarily happens when she lays flat on her back at night to go to sleep.  She denies any hearing loss.  She denies any tinnitus.  She denies any chest pain shortness of breath or dyspnea on exertion.  Her mammogram was performed in March 2019.  Her colonoscopy was performed in 2017.  She has had her flu shot.  She is due for hepatitis C and HIV screening however she politely declines this. Past Medical History:  Diagnosis Date  . Acute medial meniscus tear of left knee   . Allergy    seasonal  . Arthritis   . Blood transfusion without reported diagnosis    28 yrs ago  . Cataract    left eye-removal with implant   . DDD (degenerative disc disease), cervical   . Family history of adverse reaction to anesthesia    brother slow to wake up  . Glaucoma    glucoma like issue- cuased from past surgery increasing pressurein the eye   . Hypertension   . Primary localized osteoarthritis of left knee 11/24/2017   Past Surgical History:  Procedure Laterality Date  . CARPAL TUNNEL RELEASE     bilateral  . CERVICAL FUSION  2000  . CHONDROPLASTY Left 10/17/2014   Procedure: CHONDROPLASTY;  Surgeon: Lorn Junes, MD;  Location: [place];  Service: Orthopedics;  Laterality: Left;  . COLONOSCOPY    . KNEE ARTHROSCOPY       bilateral  . KNEE ARTHROSCOPY WITH LATERAL MENISECTOMY Left 10/17/2014   Procedure: KNEE ARTHROSCOPY WITH LATERAL MENISECTOMY;  Surgeon: Lorn Junes, MD;  Location: [place];  Service: Orthopedics;  Laterality: Left;  . KNEE ARTHROSCOPY WITH MEDIAL MENISECTOMY Left 10/17/2014   Procedure: LEFT KNEE ARTHROSCOPY WITH MEDIAL MENISCECTOMY;  Surgeon: Lorn Junes, MD;  Location: [facility];  Service: Orthopedics;  Laterality: Left;  . POLYPECTOMY    . TOTAL KNEE ARTHROPLASTY Left 12/06/2017   Procedure: LEFT TOTAL KNEE ARTHROPLASTY;  Surgeon: Elsie Saas, MD;  Location: [place];  Service: Orthopedics;  Laterality: Left;  . UPPER GI ENDOSCOPY     pt states she has never had an EGD in the past    Current Outpatient Medications on File Prior to Visit  Medication Sig Dispense Refill  . Calcium Carb-Cholecalciferol (CALTRATE 600+D) 600-800 MG-UNIT TABS Take 1 tablet by mouth daily.     . COMBIGAN 0.2-0.5 % ophthalmic solution INSTILL 1 DROP INTO LEFT EYE TWICE A DAY  3  . lisinopril (PRINIVIL,ZESTRIL) 10 MG tablet TAKE 1 TABLET BY MOUTH EVERY DAY (Patient taking differently: TAKE 1 TABLET BY MOUTH EVERY DAY AT NIGHT) 90 tablet 3  . methocarbamol (ROBAXIN) 500 MG tablet Take 1 tablet (500 mg total) by mouth every 6 (six) hours  as needed for muscle spasms. 40 tablet 0  . Multiple Vitamin (MULTIVITAMIN WITH MINERALS) TABS tablet Take 1 tablet by mouth daily.    Vladimir Faster Glycol-Propyl Glycol (SYSTANE ULTRA) 0.4-0.3 % SOLN Place 1-2 drops into both eyes 3 (three) times daily as needed (for dry/irritated eyes.).     Current Facility-Administered Medications on File Prior to Visit  Medication Dose Route Frequency Provider Last Rate Last Dose  . 0.9 %  sodium chloride infusion  500 mL Intravenous Continuous Irene Shipper, MD       Allergies  Allergen Reactions  . Penicillins Swelling, Rash and Other (See Comments)    SWELLING OF THE THROAT, OTHER CHILDHOOD  REACTION PCN REACTION WITH IMMEDIATE RASH, FACIAL/TONGUE/THROAT SWELLING, SOB, OR LIGHTHEADEDNESS WITH HYPOTENSION:  #  #  #  YES  #  #  #  Has patient had a PCN reaction causing severe rash involving mucus membranes or skin necrosis: Unknown Has patient had a PCN reaction that required hospitalization: No Has patient had a PCN reaction occurring within the last 10 years: No      . Sulfa Antibiotics Nausea And Vomiting  . Sulfasalazine Nausea And Vomiting  . Vancomycin Hives   Social History   Socioeconomic History  . Marital status: Married    Spouse name: Not on file  . Number of children: Not on file  . Years of education: Not on file  . Highest education level: Not on file  Occupational History  . Not on file  Social Needs  . Financial resource strain: Not on file  . Food insecurity:    Worry: Not on file    Inability: Not on file  . Transportation needs:    Medical: Not on file    Non-medical: Not on file  Tobacco Use  . Smoking status: Never Smoker  . Smokeless tobacco: Never Used  Substance and Sexual Activity  . Alcohol use: Yes    Comment: occasional, none since November   . Drug use: No  . Sexual activity: Not on file  Lifestyle  . Physical activity:    Days per week: Not on file    Minutes per session: Not on file  . Stress: Not on file  Relationships  . Social connections:    Talks on phone: Not on file    Gets together: Not on file    Attends religious service: Not on file    Active member of club or organization: Not on file    Attends meetings of clubs or organizations: Not on file    Relationship status: Not on file  . Intimate partner violence:    Fear of current or ex partner: Not on file    Emotionally abused: Not on file    Physically abused: Not on file    Forced sexual activity: Not on file  Other Topics Concern  . Not on file  Social History Narrative  . Not on file      Review of Systems  All other systems reviewed and are  negative.      Objective:   Physical Exam  Constitutional: She is oriented to person, place, and time. She appears well-developed and well-nourished. No distress.  HENT:  Head: Normocephalic and atraumatic.  Right Ear: External ear normal.  Left Ear: External ear normal.  Nose: Nose normal.  Mouth/Throat: Oropharynx is clear and moist. No oropharyngeal exudate.  Eyes: Pupils are equal, round, and reactive to light. EOM are normal. Right eye exhibits no  discharge. Left eye exhibits no discharge. Left conjunctiva is injected. No scleral icterus.  Neck: Normal range of motion. Neck supple. No JVD present. No tracheal deviation present. No thyromegaly present.  Cardiovascular: Normal rate, regular rhythm, normal heart sounds and intact distal pulses.  No murmur heard. Pulmonary/Chest: Effort normal and breath sounds normal. No stridor. No respiratory distress. She has no wheezes. She has no rales. She exhibits no tenderness.  Abdominal: Soft. Bowel sounds are normal. She exhibits no distension and no mass. There is no abdominal tenderness. There is no rebound and no guarding.  Musculoskeletal:        General: No edema.  Lymphadenopathy:    She has no cervical adenopathy.  Neurological: She is alert and oriented to person, place, and time. She has normal reflexes. No cranial nerve deficit. She exhibits normal muscle tone. Coordination normal.  Skin: Skin is warm. No rash noted. She is not diaphoretic. No erythema. No pallor.  Psychiatric: She has a normal mood and affect. Her behavior is normal. Judgment and thought content normal.  Vitals reviewed.         Assessment & Plan:  Benign essential HTN - Plan: CBC with Differential/Platelet, COMPLETE METABOLIC PANEL WITH GFR, Lipid panel  Mixed hyperlipidemia - Plan: CBC with Differential/Platelet, COMPLETE METABOLIC PANEL WITH GFR, Lipid panel  Pre-operative examination for internal medicine  Blood pressure is outstanding. I will make  no changes in her blood pressure medication at this time. Continue lisinopril.  I will check a CBC, CMP, fasting lipid panel. Goal LDL cholesterol is less than 130.  Immunizations are up-to-date and cancer screening is up-to-date.  I see no contraindications to her upcoming right knee replacement.  She is medically cleared to proceed

## 2018-10-25 LAB — LIPID PANEL
Cholesterol: 237 mg/dL — ABNORMAL HIGH (ref <200)
HDL: 82 mg/dL (ref >50)
LDL CHOLESTEROL (CALC): 127 mg/dL — AB
Non-HDL Cholesterol (Calc): 155 mg/dL (calc) — ABNORMAL HIGH (ref <130)
Total CHOL/HDL Ratio: 2.9 (calc) (ref <5.0)
Triglycerides: 160 mg/dL — ABNORMAL HIGH (ref <150)

## 2018-10-25 LAB — COMPLETE METABOLIC PANEL WITH GFR
AG RATIO: 1.9 (calc) (ref 1.0–2.5)
ALKALINE PHOSPHATASE (APISO): 66 U/L (ref 33–130)
ALT: 25 U/L (ref 6–29)
AST: 21 U/L (ref 10–35)
Albumin: 4.4 g/dL (ref 3.6–5.1)
BILIRUBIN TOTAL: 0.8 mg/dL (ref 0.2–1.2)
BUN: 17 mg/dL (ref 7–25)
CALCIUM: 9.7 mg/dL (ref 8.6–10.4)
CHLORIDE: 106 mmol/L (ref 98–110)
CO2: 29 mmol/L (ref 20–32)
Creat: 0.88 mg/dL (ref 0.50–0.99)
GFR, EST NON AFRICAN AMERICAN: 71 mL/min/{1.73_m2} (ref >=60)
GFR, Est African American: 83 mL/min/{1.73_m2} (ref >=60)
GLOBULIN: 2.3 g/dL (ref 1.9–3.7)
Glucose, Bld: 85 mg/dL (ref 65–99)
POTASSIUM: 4.7 mmol/L (ref 3.5–5.3)
SODIUM: 142 mmol/L (ref 135–146)
Total Protein: 6.7 g/dL (ref 6.1–8.1)

## 2018-10-25 LAB — CBC WITH DIFFERENTIAL/PLATELET
Absolute Monocytes: 493 cells/uL (ref 200–950)
BASOS PCT: 1.1 %
Basophils Absolute: 58 cells/uL (ref 0–200)
Eosinophils Absolute: 223 cells/uL (ref 15–500)
Eosinophils Relative: 4.2 %
HCT: 39.6 % (ref 35.0–45.0)
Hemoglobin: 13.3 g/dL (ref 11.7–15.5)
Lymphs Abs: 1961 cells/uL (ref 850–3900)
MCH: 30.8 pg (ref 27.0–33.0)
MCHC: 33.6 g/dL (ref 32.0–36.0)
MCV: 91.7 fL (ref 80.0–100.0)
MONOS PCT: 9.3 %
MPV: 12.2 fL (ref 7.5–12.5)
Neutro Abs: 2565 cells/uL (ref 1500–7800)
Neutrophils Relative %: 48.4 %
PLATELETS: 212 10*3/uL (ref 140–400)
RBC: 4.32 10*6/uL (ref 3.80–5.10)
RDW: 11.9 % (ref 11.0–15.0)
TOTAL LYMPHOCYTE: 37 %
WBC: 5.3 10*3/uL (ref 3.8–10.8)

## 2018-11-10 DIAGNOSIS — Z6833 Body mass index (BMI) 33.0-33.9, adult: Secondary | ICD-10-CM | POA: Diagnosis not present

## 2018-11-10 DIAGNOSIS — Z01419 Encounter for gynecological examination (general) (routine) without abnormal findings: Secondary | ICD-10-CM | POA: Diagnosis not present

## 2018-11-23 ENCOUNTER — Encounter (HOSPITAL_COMMUNITY): Payer: Self-pay | Admitting: Physician Assistant

## 2018-11-23 DIAGNOSIS — H409 Unspecified glaucoma: Secondary | ICD-10-CM | POA: Diagnosis present

## 2018-11-23 DIAGNOSIS — M1712 Unilateral primary osteoarthritis, left knee: Secondary | ICD-10-CM | POA: Diagnosis not present

## 2018-11-23 DIAGNOSIS — Z96652 Presence of left artificial knee joint: Secondary | ICD-10-CM

## 2018-11-23 NOTE — H&P (Signed)
TOTAL KNEE ADMISSION H&P  Patient is being admitted for right total knee arthroplasty.  Subjective:  Chief Complaint:right knee pain.  HPI: Nancy Steele, 63 y.o. female, has a history of pain and functional disability in the right knee due to arthritis and has failed non-surgical conservative treatments for greater than 12 weeks to includeNSAID's and/or analgesics, corticosteriod injections, viscosupplementation injections, flexibility and strengthening excercises, supervised PT with diminished ADL's post treatment, weight reduction as appropriate and activity modification.  Onset of symptoms was gradual, starting 10 years ago with gradually worsening course since that time. The patient noted prior procedures on the knee to include  arthroscopy and menisectomy on the right knee(s).  Patient currently rates pain in the right knee(s) at 10 out of 10 with activity. Patient has night pain, worsening of pain with activity and weight bearing, pain that interferes with activities of daily living, crepitus and joint swelling.  Patient has evidence of subchondral sclerosis, periarticular osteophytes and joint space narrowing by imaging studies.  There is no active infection.  Patient Active Problem List   Diagnosis Date Noted  . Primary osteoarthritis of knee 12/06/2017  . Primary localized osteoarthritis of left knee 11/24/2017  . Acute medial meniscus tear of left knee   . Hypertension    Past Medical History:  Diagnosis Date  . Acute medial meniscus tear of left knee   . Allergy    seasonal  . Arthritis   . Blood transfusion without reported diagnosis    28 yrs ago  . Cataract    left eye-removal with implant   . DDD (degenerative disc disease), cervical   . Family history of adverse reaction to anesthesia    brother slow to wake up  . Glaucoma    glucoma like issue- cuased from past surgery increasing pressurein the eye   . Hypertension   . Primary localized osteoarthritis of left  knee 11/24/2017    Past Surgical History:  Procedure Laterality Date  . CARPAL TUNNEL RELEASE     bilateral  . CERVICAL FUSION  2000  . CHONDROPLASTY Left 10/17/2014   Procedure: CHONDROPLASTY;  Surgeon: Lorn Junes, MD;  Location: [facility];  Service: Orthopedics;  Laterality: Left;  . COLONOSCOPY    . KNEE ARTHROSCOPY     bilateral  . KNEE ARTHROSCOPY WITH LATERAL MENISECTOMY Left 10/17/2014   Procedure: KNEE ARTHROSCOPY WITH LATERAL MENISECTOMY;  Surgeon: Lorn Junes, MD;  Location: [facility];  Service: Orthopedics;  Laterality: Left;  . KNEE ARTHROSCOPY WITH MEDIAL MENISECTOMY Left 10/17/2014   Procedure: LEFT KNEE ARTHROSCOPY WITH MEDIAL MENISCECTOMY;  Surgeon: Lorn Junes, MD;  Location: [facility];  Service: Orthopedics;  Laterality: Left;  . POLYPECTOMY    . TOTAL KNEE ARTHROPLASTY Left 12/06/2017   Procedure: LEFT TOTAL KNEE ARTHROPLASTY;  Surgeon: Elsie Saas, MD;  Location: [facility];  Service: Orthopedics;  Laterality: Left;  . UPPER GI ENDOSCOPY     pt states she has never had an EGD in the past     Current Facility-Administered Medications  Medication Dose Route Frequency Provider Last Rate Last Dose  . 0.9 %  sodium chloride infusion  500 mL Intravenous Continuous Irene Shipper, MD       Current Outpatient Medications  Medication Sig Dispense Refill Last Dose  . Calcium Carb-Cholecalciferol (CALTRATE 600+D) 600-800 MG-UNIT TABS Take 1 tablet by mouth daily.    11/23/2018 at Unknown time  . COMBIGAN 0.2-0.5 % ophthalmic solution Place  1 drop into the left eye 2 (two) times daily.   3 11/23/2018 at Unknown time  . Liraglutide -Weight Management (SAXENDA) 18 MG/3ML SOPN Inject 0.6 mg into the skin daily.   11/23/2018 at Unknown time  . lisinopril (PRINIVIL,ZESTRIL) 10 MG tablet TAKE 1 TABLET BY MOUTH EVERY DAY (Patient taking differently: Take 10 mg by mouth daily. ) 90 tablet 3 11/23/2018 at Unknown time  . Multiple Vitamin  (MULTIVITAMIN WITH MINERALS) TABS tablet Take 1 tablet by mouth daily.   11/23/2018 at Unknown time  . Polyethyl Glycol-Propyl Glycol (SYSTANE ULTRA) 0.4-0.3 % SOLN Place 1-2 drops into both eyes 3 (three) times daily as needed (for dry/irritated eyes.).   11/23/2018 at Unknown time   Allergies  Allergen Reactions  . Penicillins Swelling, Rash and Other (See Comments)    #####Patient has had Ancef for multiple surgeries without a reaction######   SWELLING OF THE THROAT, OTHER CHILDHOOD REACTION PCN REACTION WITH IMMEDIATE RASH, FACIAL/TONGUE/THROAT SWELLING, SOB, OR LIGHTHEADEDNESS WITH HYPOTENSION:  #  #  #  YES  #  #  #  Has patient had a PCN reaction causing severe rash involving mucus membranes or skin necrosis: Unknown Has patient had a PCN reaction that required hospitalization: No Has patient had a PCN reaction occurring within the last 10 years: No      . Sulfa Antibiotics Nausea And Vomiting  . Sulfasalazine Nausea And Vomiting  . Vancomycin Hives    Social History   Tobacco Use  . Smoking status: Never Smoker  . Smokeless tobacco: Never Used  Substance Use Topics  . Alcohol use: Yes    Comment: occasional, none since November     Family History  Problem Relation Age of Onset  . Diabetes Unknown   . Hypertension Unknown   . Arthritis Unknown   . Kidney cancer Father   . Breast cancer Maternal Aunt   . Colon cancer Neg Hx   . Colon polyps Neg Hx   . Esophageal cancer Neg Hx   . Rectal cancer Neg Hx   . Stomach cancer Neg Hx      Review of Systems  Constitutional: Negative.   HENT: Negative.   Eyes: Negative.   Respiratory: Negative.   Cardiovascular: Negative.   Gastrointestinal: Negative.   Genitourinary: Negative.   Musculoskeletal: Positive for back pain and joint pain.  Skin: Negative.   Neurological: Negative.   Endo/Heme/Allergies: Negative.   Psychiatric/Behavioral: Negative.     Objective:  Physical Exam  Constitutional: She is oriented to  person, place, and time. She appears well-developed and well-nourished.  HENT:  Head: Normocephalic and atraumatic.  Mouth/Throat: Oropharynx is clear and moist.  Eyes: Pupils are equal, round, and reactive to light. Conjunctivae and EOM are normal.  Neck: Neck supple.  Cardiovascular: Normal rate.  Respiratory: Effort normal.  GI: Soft.  Genitourinary:    Genitourinary Comments: Not pertinent to current symptomatology therefore not examined.   Musculoskeletal:     Comments: Examination of her left knee reveals well healed total knee incision without swelling or pain.  Full range of motion.  Knee is stable with normal patella tracking.  Examination of her right knee reveals pain.  1+ crepitation.  1+ synovitis.  Range of motion 0-120 degrees.  Knee is stable with normal patella tracking.  Vascular exam: Pulses are 2+ and symmetric.  Neurologic exam: Distal motor and sensory examination is within normal limits.    Neurological: She is alert and oriented to person, place, and time.  Skin: Skin is warm and dry.  Psychiatric: She has a normal mood and affect. Her behavior is normal.    Vital signs in last 24 hours: BP: ()/()  Arterial Line BP: ()/()   Labs:   Estimated body mass index is 31.93 kg/m as calculated from the following:   Height as of 10/24/18: 5\' 8"  (1.727 m).   Weight as of 10/24/18: 95.3 kg.   Imaging Review Plain radiographs demonstrate severe degenerative joint disease of the right knee(s). The overall alignment issignificant varus. The bone quality appears to be good for age and reported activity level.      Assessment/Plan:  End stage arthritis, right knee Principal Problem:   Primary localized osteoarthritis of right knee Active Problems:   S/P total knee arthroplasty, left   Glaucoma    The patient history, physical examination, clinical judgment of the provider and imaging studies are consistent with end stage degenerative joint disease of the right  knee(s) and total knee arthroplasty is deemed medically necessary. The treatment options including medical management, injection therapy arthroscopy and arthroplasty were discussed at length. The risks and benefits of total knee arthroplasty were presented and reviewed. The risks due to aseptic loosening, infection, stiffness, patella tracking problems, thromboembolic complications and other imponderables were discussed. The patient acknowledged the explanation, agreed to proceed with the plan and consent was signed. Patient is being admitted for inpatient treatment for surgery, pain control, PT, OT, prophylactic antibiotics, VTE prophylaxis, progressive ambulation and ADL's and discharge planning. The patient is planning to be discharged home with home health services    Anticipated LOS equal to or greater than 2 midnights due to - Age 59 and older with one or more of the following:  - Obesity  - Expected need for hospital services (PT, OT, Nursing) required for safe  discharge  - Anticipated need for postoperative skilled nursing care or inpatient rehab  - Active co-morbidities: None OR   - Unanticipated findings during/Post Surgery: None  - Patient is a high risk of re-admission due to: None

## 2018-11-25 NOTE — Patient Instructions (Addendum)
Nancy Steele  January 27, 1956      Your procedure is scheduled on:  12-05-2018   Report to [facility] Main  Entrance,  Report to admitting at  5:30 AM    Call this number if you have problems the morning of surgery [phone]       Remember: Do not eat food or drink liquids :After Midnight.   BRUSH YOUR TEETH MORNING OF SURGERY AND RINSE YOUR MOUTH OUT, NO CHEWING GUM CANDY OR MINTS.     Take these medicines the morning of surgery with A SIP OF WATER:   Eye drops as usual and bring eye drops with you day of surgery                                   You may not have any metal on your body including hair pins and               piercings  Do not wear jewelry, make-up, lotions, powders or perfumes, deodorant              Do not wear nail polish.  Do not shave  48 hours prior to surgery.                    Do not bring valuables to the hospital. [place].  Contacts, dentures or bridgework may not be worn into surgery.  Leave suitcase in the car. After surgery it may be brought to your room.      _____________________________________________________________________             [facility] - Preparing for Surgery Before surgery, you can play an important role.  Because skin is not sterile, your skin needs to be as free of germs as possible.  You can reduce the number of germs on your skin by washing with CHG (chlorahexidine gluconate) soap before surgery.  CHG is an antiseptic cleaner which kills germs and bonds with the skin to continue killing germs even after washing. Please DO NOT use if you have an allergy to CHG or antibacterial soaps.  If your skin becomes reddened/irritated stop using the CHG and inform your nurse when you arrive at Short Stay. Do not shave (including legs and underarms) for at least 48 hours prior to the first CHG shower.  You may shave your face/neck. Please follow  these instructions carefully:  1.  Shower with CHG Soap the night before surgery and the  morning of Surgery.  2.  If you choose to wash your hair, wash your hair first as usual with your  normal  shampoo.  3.  After you shampoo, rinse your hair and body thoroughly to remove the  shampoo.                            4.  Use CHG as you would any other liquid soap.  You can apply chg directly  to the skin and wash                       Gently with a scrungie or clean washcloth.  5.  Apply the CHG Soap  to your body ONLY FROM THE NECK DOWN.   Do not use on face/ open                           Wound or open sores. Avoid contact with eyes, ears mouth and genitals (private parts).                       Wash face,  Genitals (private parts) with your normal soap.             6.  Wash thoroughly, paying special attention to the area where your surgery  will be performed.  7.  Thoroughly rinse your body with warm water from the neck down.  8.  DO NOT shower/wash with your normal soap after using and rinsing off  the CHG Soap.             9.  Pat yourself dry with a clean towel.            10.  Wear clean pajamas.            11.  Place clean sheets on your bed the night of your first shower and do not  sleep with pets. Day of Surgery : Do not apply any lotions/deodorants the morning of surgery.  Please wear clean clothes to the hospital/surgery center.  FAILURE TO FOLLOW THESE INSTRUCTIONS MAY RESULT IN THE CANCELLATION OF YOUR SURGERY PATIENT SIGNATURE_________________________________  NURSE SIGNATURE__________________________________  ________________________________________________________________________   Nancy Steele  An incentive spirometer is a tool that can help keep your lungs clear and active. This tool measures how well you are filling your lungs with each breath. Taking long deep breaths may help reverse or decrease the chance of developing breathing (pulmonary) problems  (especially infection) following:  A long period of time when you are unable to move or be active. BEFORE THE PROCEDURE   If the spirometer includes an indicator to show your best effort, your nurse or respiratory therapist will set it to a desired goal.  If possible, sit up straight or lean slightly forward. Try not to slouch.  Hold the incentive spirometer in an upright position. INSTRUCTIONS FOR USE  1. Sit on the edge of your bed if possible, or sit up as far as you can in bed or on a chair. 2. Hold the incentive spirometer in an upright position. 3. Breathe out normally. 4. Place the mouthpiece in your mouth and seal your lips tightly around it. 5. Breathe in slowly and as deeply as possible, raising the piston or the ball toward the top of the column. 6. Hold your breath for 3-5 seconds or for as long as possible. Allow the piston or ball to fall to the bottom of the column. 7. Remove the mouthpiece from your mouth and breathe out normally. 8. Rest for a few seconds and repeat Steps 1 through 7 at least 10 times every 1-2 hours when you are awake. Take your time and take a few normal breaths between deep breaths. 9. The spirometer may include an indicator to show your best effort. Use the indicator as a goal to work toward during each repetition. 10. After each set of 10 deep breaths, practice coughing to be sure your lungs are clear. If you have an incision (the cut made at the time of surgery), support your incision when coughing by placing a pillow or rolled up towels firmly against it. Once  you are able to get out of bed, walk around indoors and cough well. You may stop using the incentive spirometer when instructed by your caregiver.  RISKS AND COMPLICATIONS  Take your time so you do not get dizzy or light-headed.  If you are in pain, you may need to take or ask for pain medication before doing incentive spirometry. It is harder to take a deep breath if you are having  pain. AFTER USE  Rest and breathe slowly and easily.  It can be helpful to keep track of a log of your progress. Your caregiver can provide you with a simple table to help with this. If you are using the spirometer at home, follow these instructions: [place] IF:   You are having difficultly using the spirometer.  You have trouble using the spirometer as often as instructed.  Your pain medication is not giving enough relief while using the spirometer.  You develop fever of 100.5 F (38.1 C) or higher. SEEK IMMEDIATE MEDICAL CARE IF:   You cough up bloody sputum that had not been present before.  You develop fever of 102 F (38.9 C) or greater.  You develop worsening pain at or near the incision site. MAKE SURE YOU:   Understand these instructions.  Will watch your condition.  Will get help right away if you are not doing well or get worse. Document Released: 02/15/2007 Document Revised: 12/28/2011 Document Reviewed: 04/18/2007 Exit[employer] Patient Information 2014 [employer], [place].   ________________________________________________________________________  WHAT IS A BLOOD TRANSFUSION? Blood Transfusion Information  A transfusion is the replacement of blood or some of its parts. Blood is made up of multiple cells which provide different functions.  Red blood cells carry oxygen and are used for blood loss replacement.  White blood cells fight against infection.  Platelets control bleeding.  Plasma helps clot blood.  Other blood products are available for specialized needs, such as hemophilia or other clotting disorders. BEFORE THE TRANSFUSION  Who gives blood for transfusions?   Healthy volunteers who are fully evaluated to make sure their blood is safe. This is blood bank blood. Transfusion therapy is the safest it has ever been in the practice of medicine. Before blood is taken from a donor, a complete history is taken to make sure that person has no history  of diseases nor engages in risky social behavior (examples are intravenous drug use or sexual activity with multiple partners). The donor's travel history is screened to minimize risk of transmitting infections, such as malaria. The donated blood is tested for signs of infectious diseases, such as HIV and hepatitis. The blood is then tested to be sure it is compatible with you in order to minimize the chance of a transfusion reaction. If you or a relative donates blood, this is often done in anticipation of surgery and is not appropriate for emergency situations. It takes many days to process the donated blood. RISKS AND COMPLICATIONS Although transfusion therapy is very safe and saves many lives, the main dangers of transfusion include:   Getting an infectious disease.  Developing a transfusion reaction. This is an allergic reaction to something in the blood you were given. Every precaution is taken to prevent this. The decision to have a blood transfusion has been considered carefully by your caregiver before blood is given. Blood is not given unless the benefits outweigh the risks. AFTER THE TRANSFUSION  Right after receiving a blood transfusion, you will usually feel much better and more energetic. This  is especially true if your red blood cells have gotten low (anemic). The transfusion raises the level of the red blood cells which carry oxygen, and this usually causes an energy increase.  The nurse administering the transfusion will monitor you carefully for complications. HOME CARE INSTRUCTIONS  No special instructions are needed after a transfusion. You may find your energy is better. Speak with your caregiver about any limitations on activity for underlying diseases you may have. SEEK MEDICAL CARE IF:   Your condition is not improving after your transfusion.  You develop redness or irritation at the intravenous (IV) site. SEEK IMMEDIATE MEDICAL CARE IF:  Any of the following symptoms  occur over the next 12 hours:  Shaking chills.  You have a temperature by mouth above 102 F (38.9 C), not controlled by medicine.  Chest, back, or muscle pain.  People around you feel you are not acting correctly or are confused.  Shortness of breath or difficulty breathing.  Dizziness and fainting.  You get a rash or develop hives.  You have a decrease in urine output.  Your urine turns a dark color or changes to pink, red, or brown. Any of the following symptoms occur over the next 10 days:  You have a temperature by mouth above 102 F (38.9 C), not controlled by medicine.  Shortness of breath.  Weakness after normal activity.  The white part of the eye turns yellow (jaundice).  You have a decrease in the amount of urine or are urinating less often.  Your urine turns a dark color or changes to pink, red, or brown. Document Released: 10/02/2000 Document Revised: 12/28/2011 Document Reviewed: 05/21/2008 [facility] Patient Information 2014 [place], [place].  _______________________________________________________________________

## 2018-11-28 ENCOUNTER — Encounter (HOSPITAL_COMMUNITY): Payer: Self-pay

## 2018-11-28 ENCOUNTER — Encounter (HOSPITAL_COMMUNITY)
Admission: RE | Admit: 2018-11-28 | Discharge: 2018-11-28 | Disposition: A | Discharge status: Home / Self Care | Payer: 59 | Source: Ambulatory Visit | Attending: Orthopedic Surgery | Admitting: Orthopedic Surgery

## 2018-11-28 ENCOUNTER — Other Ambulatory Visit (HOSPITAL_COMMUNITY): Payer: 59

## 2018-11-28 ENCOUNTER — Other Ambulatory Visit: Payer: Self-pay

## 2018-11-28 ENCOUNTER — Inpatient Hospital Stay (HOSPITAL_COMMUNITY): Admission: RE | Admit: 2018-11-28 | Payer: 59 | Source: Ambulatory Visit

## 2018-11-28 DIAGNOSIS — Z01818 Encounter for other preprocedural examination: Secondary | ICD-10-CM | POA: Diagnosis not present

## 2018-11-28 HISTORY — DX: Other seasonal allergic rhinitis: J30.2

## 2018-11-28 HISTORY — DX: Unspecified osteoarthritis, unspecified site: M19.90

## 2018-11-28 HISTORY — DX: Unspecified glaucoma: H40.9

## 2018-11-28 HISTORY — DX: Presence of spectacles and contact lenses: Z97.3

## 2018-11-28 LAB — PROTIME-INR
INR: 0.94
Prothrombin Time: 12.5 seconds (ref 11.4–15.2)

## 2018-11-28 LAB — CBC WITH DIFFERENTIAL/PLATELET
Abs Immature Granulocytes: 0.02 10*3/uL (ref 0.00–0.07)
Basophils Absolute: 0 10*3/uL (ref 0.0–0.1)
Basophils Relative: 1 %
EOS PCT: 3 %
Eosinophils Absolute: 0.1 10*3/uL (ref 0.0–0.5)
HCT: 39.1 % (ref 36.0–46.0)
Hemoglobin: 12.5 g/dL (ref 12.0–15.0)
Immature Granulocytes: 1 %
Lymphocytes Relative: 38 %
Lymphs Abs: 1.6 10*3/uL (ref 0.7–4.0)
MCH: 29.9 pg (ref 26.0–34.0)
MCHC: 32 g/dL (ref 30.0–36.0)
MCV: 93.5 fL (ref 80.0–100.0)
Monocytes Absolute: 0.3 10*3/uL (ref 0.1–1.0)
Monocytes Relative: 8 %
Neutro Abs: 2.1 10*3/uL (ref 1.7–7.7)
Neutrophils Relative %: 49 %
Platelets: 225 10*3/uL (ref 150–400)
RBC: 4.18 MIL/uL (ref 3.87–5.11)
RDW: 12.5 % (ref 11.5–15.5)
WBC: 4.2 10*3/uL (ref 4.0–10.5)
nRBC: 0 % (ref 0.0–0.2)

## 2018-11-28 LAB — COMPREHENSIVE METABOLIC PANEL
ALT: 20 U/L (ref 0–44)
AST: 19 U/L (ref 15–41)
Albumin: 4.6 g/dL (ref 3.5–5.0)
Alkaline Phosphatase: 55 U/L (ref 38–126)
Anion gap: 6 (ref 5–15)
BUN: 18 mg/dL (ref 6–20)
CHLORIDE: 108 mmol/L (ref 98–111)
CO2: 27 mmol/L (ref 22–32)
Calcium: 9 mg/dL (ref 8.9–10.3)
Creatinine, Ser: 0.83 mg/dL (ref 0.44–1.00)
GFR calc Af Amer: >60 mL/min (ref >60)
GFR calc non Af Amer: >60 mL/min (ref >60)
Glucose, Bld: 94 mg/dL (ref 70–99)
Potassium: 4.8 mmol/L (ref 3.5–5.1)
Sodium: 141 mmol/L (ref 135–145)
Total Bilirubin: 0.6 mg/dL (ref 0.3–1.2)
Total Protein: 7.4 g/dL (ref 6.5–8.1)

## 2018-11-28 LAB — ABO/RH: ABO/RH(D): A NEG

## 2018-11-28 LAB — APTT: aPTT: 29 seconds (ref 24–36)

## 2018-11-28 NOTE — Progress Notes (Signed)
Pt pcp surgical clearance dated 10-24-2018 in chart.  PCP , dr Dennard Schaumann, [place] note dated 10-24-2018 in epic.

## 2018-11-29 LAB — URINE CULTURE: Culture: NO GROWTH

## 2018-11-29 LAB — SURGICAL PCR SCREEN
MRSA, PCR: NEGATIVE
Staphylococcus aureus: NEGATIVE

## 2018-12-03 NOTE — Anesthesia Preprocedure Evaluation (Signed)
Anesthesia Evaluation  Patient identified by MRN, date of birth, ID band Patient awake    Reviewed: Allergy & Precautions, H&P , NPO status , Patient's Chart, lab work & pertinent test results  Airway Mallampati: III  TM Distance: >3 FB Neck ROM: Full    Dental no notable dental hx.    Pulmonary neg pulmonary ROS,    Pulmonary exam normal breath sounds clear to auscultation       Cardiovascular hypertension, Pt. on medications Normal cardiovascular exam Rhythm:Regular Rate:Normal  ECG: SR, occ PVC's. Rate 74   Neuro/Psych negative neurological ROS  negative psych ROS   GI/Hepatic negative GI ROS, Neg liver ROS,   Endo/Other  negative endocrine ROS  Renal/GU negative Renal ROS     Musculoskeletal  (+) Arthritis , Osteoarthritis,    Abdominal   Peds  Hematology HLD   Anesthesia Other Findings DJD LEFT KNEE  Reproductive/Obstetrics                             Anesthesia Physical  Anesthesia Plan  ASA: II  Anesthesia Plan: Regional and Spinal   Post-op Pain Management:    Induction: Intravenous  PONV Risk Score and Plan: 2 and Ondansetron, Dexamethasone, Treatment may vary due to age or medical condition and Midazolam  Airway Management Planned: Natural Airway and Nasal Cannula  Additional Equipment:   Intra-op Plan:   Post-operative Plan:   Informed Consent: I have reviewed the patients History and Physical, chart, labs and discussed the procedure including the risks, benefits and alternatives for the proposed anesthesia with the patient or authorized representative who has indicated his/her understanding and acceptance.     Dental advisory given  Plan Discussed with: CRNA, Anesthesiologist and Surgeon  Anesthesia Plan Comments:         Anesthesia Quick Evaluation

## 2018-12-04 MED ORDER — BUPIVACAINE LIPOSOME 1.3 % IJ SUSP
20.0000 mL | Freq: Once | INTRAMUSCULAR | Status: DC
  Filled 2018-12-04: qty 20

## 2018-12-05 ENCOUNTER — Inpatient Hospital Stay (HOSPITAL_COMMUNITY): Payer: 59 | Admitting: Registered Nurse

## 2018-12-05 ENCOUNTER — Encounter (HOSPITAL_COMMUNITY): Payer: Self-pay

## 2018-12-05 ENCOUNTER — Inpatient Hospital Stay (HOSPITAL_COMMUNITY)
Admission: RE | Admit: 2018-12-05 | Discharge: 2018-12-06 | DRG: 470 | Disposition: A | Discharge status: Home Health | Payer: 59 | Attending: Orthopedic Surgery | Admitting: Orthopedic Surgery

## 2018-12-05 ENCOUNTER — Other Ambulatory Visit: Payer: Self-pay

## 2018-12-05 ENCOUNTER — Inpatient Hospital Stay (HOSPITAL_COMMUNITY): Payer: 59 | Admitting: Physician Assistant

## 2018-12-05 ENCOUNTER — Encounter (HOSPITAL_COMMUNITY)
Admission: RE | Disposition: A | Discharge status: Home Health | Payer: Self-pay | Source: Home / Self Care | Attending: Orthopedic Surgery

## 2018-12-05 DIAGNOSIS — Z79899 Other long term (current) drug therapy: Secondary | ICD-10-CM

## 2018-12-05 DIAGNOSIS — M1711 Unilateral primary osteoarthritis, right knee: Secondary | ICD-10-CM | POA: Diagnosis present

## 2018-12-05 DIAGNOSIS — G8918 Other acute postprocedural pain: Secondary | ICD-10-CM | POA: Diagnosis not present

## 2018-12-05 DIAGNOSIS — Z96652 Presence of left artificial knee joint: Secondary | ICD-10-CM

## 2018-12-05 DIAGNOSIS — Z981 Arthrodesis status: Secondary | ICD-10-CM

## 2018-12-05 DIAGNOSIS — I10 Essential (primary) hypertension: Secondary | ICD-10-CM | POA: Diagnosis not present

## 2018-12-05 DIAGNOSIS — Z6832 Body mass index (BMI) 32.0-32.9, adult: Secondary | ICD-10-CM | POA: Diagnosis not present

## 2018-12-05 DIAGNOSIS — M170 Bilateral primary osteoarthritis of knee: Principal | ICD-10-CM | POA: Diagnosis present

## 2018-12-05 DIAGNOSIS — M25761 Osteophyte, right knee: Secondary | ICD-10-CM | POA: Diagnosis present

## 2018-12-05 DIAGNOSIS — H409 Unspecified glaucoma: Secondary | ICD-10-CM | POA: Diagnosis present

## 2018-12-05 DIAGNOSIS — J302 Other seasonal allergic rhinitis: Secondary | ICD-10-CM | POA: Diagnosis present

## 2018-12-05 DIAGNOSIS — Z882 Allergy status to sulfonamides status: Secondary | ICD-10-CM

## 2018-12-05 DIAGNOSIS — Z881 Allergy status to other antibiotic agents status: Secondary | ICD-10-CM | POA: Diagnosis not present

## 2018-12-05 DIAGNOSIS — E669 Obesity, unspecified: Secondary | ICD-10-CM | POA: Diagnosis present

## 2018-12-05 DIAGNOSIS — Z880 Allergy status to penicillin: Secondary | ICD-10-CM | POA: Diagnosis not present

## 2018-12-05 DIAGNOSIS — M25561 Pain in right knee: Secondary | ICD-10-CM | POA: Diagnosis not present

## 2018-12-05 HISTORY — PX: TOTAL KNEE ARTHROPLASTY: SHX125

## 2018-12-05 LAB — TYPE AND SCREEN
ABO/RH(D): A NEG
Antibody Screen: NEGATIVE

## 2018-12-05 SURGERY — ARTHROPLASTY, KNEE, TOTAL
Anesthesia: Regional | Site: Knee | Laterality: Right

## 2018-12-05 MED ORDER — SODIUM CHLORIDE (PF) 0.9 % IJ SOLN
INTRAMUSCULAR | Status: AC
  Filled 2018-12-05: qty 50

## 2018-12-05 MED ORDER — LACTATED RINGERS IV SOLN
INTRAVENOUS | Status: DC
  Administered 2018-12-05: 07:00:00 via INTRAVENOUS

## 2018-12-05 MED ORDER — GABAPENTIN 300 MG PO CAPS
300.0000 mg | ORAL_CAPSULE | Freq: Every day | ORAL | Status: DC
  Administered 2018-12-05: 300 mg via ORAL
  Filled 2018-12-05: qty 1

## 2018-12-05 MED ORDER — MEPERIDINE HCL 50 MG/ML IJ SOLN: 6.2500 mg | INTRAMUSCULAR | Status: DC | PRN

## 2018-12-05 MED ORDER — ONDANSETRON HCL 4 MG PO TABS: 4.0000 mg | ORAL_TABLET | Freq: Four times a day (QID) | ORAL | Status: DC | PRN

## 2018-12-05 MED ORDER — OXYCODONE HCL 5 MG/5ML PO SOLN: 5.0000 mg | Freq: Once | ORAL | Status: DC | PRN

## 2018-12-05 MED ORDER — MIDAZOLAM HCL 2 MG/2ML IJ SOLN
INTRAMUSCULAR | Status: AC
  Filled 2018-12-05: qty 2

## 2018-12-05 MED ORDER — DEXAMETHASONE SODIUM PHOSPHATE 10 MG/ML IJ SOLN
10.0000 mg | Freq: Three times a day (TID) | INTRAMUSCULAR | Status: DC
  Administered 2018-12-05 – 2018-12-06 (×3): 10 mg via INTRAVENOUS
  Filled 2018-12-05 (×3): qty 1

## 2018-12-05 MED ORDER — EPHEDRINE 5 MG/ML INJ
INTRAVENOUS | Status: AC
  Filled 2018-12-05: qty 10

## 2018-12-05 MED ORDER — BUPIVACAINE-EPINEPHRINE 0.5% -1:200000 IJ SOLN
INTRAMUSCULAR | Status: DC | PRN
  Administered 2018-12-05: 30 mL

## 2018-12-05 MED ORDER — DEXAMETHASONE SODIUM PHOSPHATE 10 MG/ML IJ SOLN
INTRAMUSCULAR | Status: DC | PRN
  Administered 2018-12-05: 10 mg via INTRAVENOUS

## 2018-12-05 MED ORDER — HYDROMORPHONE HCL 1 MG/ML IJ SOLN
0.5000 mg | INTRAMUSCULAR | Status: DC | PRN
  Administered 2018-12-05: 1 mg via INTRAVENOUS
  Filled 2018-12-05: qty 1

## 2018-12-05 MED ORDER — PHENYLEPHRINE 40 MCG/ML (10ML) SYRINGE FOR IV PUSH (FOR BLOOD PRESSURE SUPPORT)
PREFILLED_SYRINGE | INTRAVENOUS | Status: AC
  Filled 2018-12-05: qty 10

## 2018-12-05 MED ORDER — BUPIVACAINE LIPOSOME 1.3 % IJ SUSP
INTRAMUSCULAR | Status: DC | PRN
  Administered 2018-12-05: 20 mL

## 2018-12-05 MED ORDER — ADULT MULTIVITAMIN W/MINERALS CH
1.0000 | ORAL_TABLET | Freq: Every day | ORAL | Status: DC
  Administered 2018-12-06: 1 via ORAL
  Filled 2018-12-05: qty 1

## 2018-12-05 MED ORDER — ALUM & MAG HYDROXIDE-SIMETH 200-200-20 MG/5ML PO SUSP: 30.0000 mL | ORAL | Status: DC | PRN

## 2018-12-05 MED ORDER — FENTANYL CITRATE (PF) 100 MCG/2ML IJ SOLN: 25.0000 ug | INTRAMUSCULAR | Status: DC | PRN

## 2018-12-05 MED ORDER — ONDANSETRON HCL 4 MG/2ML IJ SOLN: 4.0000 mg | Freq: Once | INTRAMUSCULAR | Status: DC | PRN

## 2018-12-05 MED ORDER — CALCIUM CARBONATE-VITAMIN D 500-200 MG-UNIT PO TABS
1.0000 | ORAL_TABLET | Freq: Every day | ORAL | Status: DC
  Administered 2018-12-06: 1 via ORAL
  Filled 2018-12-05: qty 1

## 2018-12-05 MED ORDER — POVIDONE-IODINE 7.5 % EX SOLN: Freq: Once | CUTANEOUS | Status: DC

## 2018-12-05 MED ORDER — LIRAGLUTIDE 18 MG/3ML ~~LOC~~ SOPN: 0.6000 mg | PEN_INJECTOR | Freq: Every day | SUBCUTANEOUS | Status: DC

## 2018-12-05 MED ORDER — PHENOL 1.4 % MT LIQD: 1.0000 | OROMUCOSAL | Status: DC | PRN

## 2018-12-05 MED ORDER — EPHEDRINE SULFATE-NACL 50-0.9 MG/10ML-% IV SOSY
PREFILLED_SYRINGE | INTRAVENOUS | Status: DC | PRN
  Administered 2018-12-05: 10 mg via INTRAVENOUS

## 2018-12-05 MED ORDER — BRIMONIDINE TARTRATE-TIMOLOL 0.2-0.5 % OP SOLN: 1.0000 [drp] | Freq: Two times a day (BID) | OPHTHALMIC | Status: DC

## 2018-12-05 MED ORDER — MENTHOL 3 MG MT LOZG: 1.0000 | LOZENGE | OROMUCOSAL | Status: DC | PRN

## 2018-12-05 MED ORDER — SODIUM CHLORIDE (PF) 0.9 % IJ SOLN
INTRAMUSCULAR | Status: DC | PRN
  Administered 2018-12-05: 50 mL

## 2018-12-05 MED ORDER — DEXAMETHASONE SODIUM PHOSPHATE 10 MG/ML IJ SOLN
INTRAMUSCULAR | Status: AC
  Filled 2018-12-05: qty 1

## 2018-12-05 MED ORDER — ONDANSETRON HCL 4 MG/2ML IJ SOLN
INTRAMUSCULAR | Status: AC
  Filled 2018-12-05: qty 2

## 2018-12-05 MED ORDER — DOCUSATE SODIUM 100 MG PO CAPS
100.0000 mg | ORAL_CAPSULE | Freq: Two times a day (BID) | ORAL | Status: DC
  Administered 2018-12-05 – 2018-12-06 (×2): 100 mg via ORAL
  Filled 2018-12-05 (×2): qty 1

## 2018-12-05 MED ORDER — DIPHENHYDRAMINE HCL 12.5 MG/5ML PO ELIX: 12.5000 mg | ORAL_SOLUTION | ORAL | Status: DC | PRN

## 2018-12-05 MED ORDER — ACETAMINOPHEN 500 MG PO TABS
1000.0000 mg | ORAL_TABLET | Freq: Four times a day (QID) | ORAL | Status: DC
  Administered 2018-12-05 – 2018-12-06 (×3): 1000 mg via ORAL
  Filled 2018-12-05 (×4): qty 2

## 2018-12-05 MED ORDER — TIMOLOL MALEATE 0.5 % OP SOLN
1.0000 [drp] | Freq: Two times a day (BID) | OPHTHALMIC | Status: DC
  Filled 2018-12-05: qty 5

## 2018-12-05 MED ORDER — BUPIVACAINE-EPINEPHRINE (PF) 0.5% -1:200000 IJ SOLN
INTRAMUSCULAR | Status: AC
  Filled 2018-12-05: qty 30

## 2018-12-05 MED ORDER — ACETAMINOPHEN 325 MG PO TABS: 325.0000 mg | ORAL_TABLET | ORAL | Status: DC | PRN

## 2018-12-05 MED ORDER — LACTATED RINGERS IV SOLN
INTRAVENOUS | Status: DC
  Administered 2018-12-05 (×2): via INTRAVENOUS

## 2018-12-05 MED ORDER — OXYCODONE HCL 5 MG PO TABS
5.0000 mg | ORAL_TABLET | ORAL | Status: DC | PRN
  Administered 2018-12-05 – 2018-12-06 (×3): 10 mg via ORAL
  Filled 2018-12-05 (×3): qty 2

## 2018-12-05 MED ORDER — POLYETHYL GLYCOL-PROPYL GLYCOL 0.4-0.3 % OP SOLN: 1.0000 [drp] | Freq: Three times a day (TID) | OPHTHALMIC | Status: DC | PRN

## 2018-12-05 MED ORDER — CEFAZOLIN SODIUM-DEXTROSE 2-4 GM/100ML-% IV SOLN
2.0000 g | INTRAVENOUS | Status: AC
  Administered 2018-12-05: 2 g via INTRAVENOUS
  Filled 2018-12-05: qty 100

## 2018-12-05 MED ORDER — PROPOFOL 500 MG/50ML IV EMUL
INTRAVENOUS | Status: DC | PRN
  Administered 2018-12-05: 75 ug/kg/min via INTRAVENOUS

## 2018-12-05 MED ORDER — CEFAZOLIN SODIUM-DEXTROSE 2-4 GM/100ML-% IV SOLN
2.0000 g | Freq: Four times a day (QID) | INTRAVENOUS | Status: AC
  Administered 2018-12-05 (×2): 2 g via INTRAVENOUS
  Filled 2018-12-05 (×2): qty 100

## 2018-12-05 MED ORDER — SODIUM CHLORIDE 0.9 % IR SOLN
Status: DC | PRN
  Administered 2018-12-05: 1000 mL

## 2018-12-05 MED ORDER — LIDOCAINE 2% (20 MG/ML) 5 ML SYRINGE
INTRAMUSCULAR | Status: DC | PRN
  Administered 2018-12-05: 40 mg via INTRAVENOUS

## 2018-12-05 MED ORDER — POLYVINYL ALCOHOL 1.4 % OP SOLN
1.0000 [drp] | OPHTHALMIC | Status: DC | PRN
  Filled 2018-12-05: qty 15

## 2018-12-05 MED ORDER — BRIMONIDINE TARTRATE 0.2 % OP SOLN
1.0000 [drp] | Freq: Two times a day (BID) | OPHTHALMIC | Status: DC
  Filled 2018-12-05: qty 5

## 2018-12-05 MED ORDER — LIRAGLUTIDE -WEIGHT MANAGEMENT 18 MG/3ML ~~LOC~~ SOPN: 0.6000 mg | PEN_INJECTOR | Freq: Every day | SUBCUTANEOUS | Status: DC

## 2018-12-05 MED ORDER — CLONIDINE HCL (ANALGESIA) 100 MCG/ML EP SOLN
EPIDURAL | Status: DC | PRN
  Administered 2018-12-05: 100 ug

## 2018-12-05 MED ORDER — LIDOCAINE 2% (20 MG/ML) 5 ML SYRINGE
INTRAMUSCULAR | Status: AC
  Filled 2018-12-05: qty 5

## 2018-12-05 MED ORDER — FENTANYL CITRATE (PF) 100 MCG/2ML IJ SOLN
INTRAMUSCULAR | Status: AC
  Filled 2018-12-05: qty 2

## 2018-12-05 MED ORDER — ROPIVACAINE HCL 7.5 MG/ML IJ SOLN
INTRAMUSCULAR | Status: DC | PRN
  Administered 2018-12-05: 25 mL via PERINEURAL

## 2018-12-05 MED ORDER — STERILE WATER FOR IRRIGATION IR SOLN
Status: DC | PRN
  Administered 2018-12-05: 1000 mL

## 2018-12-05 MED ORDER — TRANEXAMIC ACID-NACL 1000-0.7 MG/100ML-% IV SOLN
1000.0000 mg | INTRAVENOUS | Status: AC
  Administered 2018-12-05: 1000 mg via INTRAVENOUS
  Filled 2018-12-05: qty 100

## 2018-12-05 MED ORDER — OXYCODONE HCL 5 MG PO TABS: 5.0000 mg | ORAL_TABLET | Freq: Once | ORAL | Status: DC | PRN

## 2018-12-05 MED ORDER — ONDANSETRON HCL 4 MG/2ML IJ SOLN
INTRAMUSCULAR | Status: DC | PRN
  Administered 2018-12-05: 4 mg via INTRAVENOUS

## 2018-12-05 MED ORDER — CHLORHEXIDINE GLUCONATE 4 % EX LIQD: 60.0000 mL | Freq: Once | CUTANEOUS | Status: DC

## 2018-12-05 MED ORDER — MIDAZOLAM HCL 5 MG/5ML IJ SOLN
INTRAMUSCULAR | Status: DC | PRN
  Administered 2018-12-05: 2 mg via INTRAVENOUS

## 2018-12-05 MED ORDER — METOCLOPRAMIDE HCL 5 MG PO TABS: 5.0000 mg | ORAL_TABLET | Freq: Three times a day (TID) | ORAL | Status: DC | PRN

## 2018-12-05 MED ORDER — PROPOFOL 10 MG/ML IV BOLUS
INTRAVENOUS | Status: DC | PRN
  Administered 2018-12-05: 20 mg via INTRAVENOUS

## 2018-12-05 MED ORDER — POTASSIUM CHLORIDE IN NACL 20-0.9 MEQ/L-% IV SOLN
INTRAVENOUS | Status: DC
  Administered 2018-12-05: 13:00:00 via INTRAVENOUS
  Filled 2018-12-05 (×2): qty 1000

## 2018-12-05 MED ORDER — PROPOFOL 10 MG/ML IV BOLUS
INTRAVENOUS | Status: AC
  Filled 2018-12-05: qty 60

## 2018-12-05 MED ORDER — FENTANYL CITRATE (PF) 100 MCG/2ML IJ SOLN
INTRAMUSCULAR | Status: DC | PRN
  Administered 2018-12-05 (×2): 50 ug via INTRAVENOUS

## 2018-12-05 MED ORDER — PHENYLEPHRINE 40 MCG/ML (10ML) SYRINGE FOR IV PUSH (FOR BLOOD PRESSURE SUPPORT)
PREFILLED_SYRINGE | INTRAVENOUS | Status: DC | PRN
  Administered 2018-12-05 (×7): 80 ug via INTRAVENOUS

## 2018-12-05 MED ORDER — ACETAMINOPHEN 160 MG/5ML PO SOLN: 325.0000 mg | ORAL | Status: DC | PRN

## 2018-12-05 MED ORDER — ONDANSETRON HCL 4 MG/2ML IJ SOLN
4.0000 mg | Freq: Four times a day (QID) | INTRAMUSCULAR | Status: DC | PRN
  Administered 2018-12-05: 4 mg via INTRAVENOUS
  Filled 2018-12-05: qty 2

## 2018-12-05 MED ORDER — POLYETHYLENE GLYCOL 3350 17 G PO PACK
17.0000 g | PACK | Freq: Two times a day (BID) | ORAL | Status: DC
  Administered 2018-12-06: 17 g via ORAL
  Filled 2018-12-05: qty 1

## 2018-12-05 MED ORDER — METOCLOPRAMIDE HCL 5 MG/ML IJ SOLN: 5.0000 mg | Freq: Three times a day (TID) | INTRAMUSCULAR | Status: DC | PRN

## 2018-12-05 MED ORDER — ASPIRIN EC 325 MG PO TBEC
325.0000 mg | DELAYED_RELEASE_TABLET | Freq: Every day | ORAL | Status: DC
  Administered 2018-12-06: 325 mg via ORAL
  Filled 2018-12-05: qty 1

## 2018-12-05 MED ORDER — BUPIVACAINE IN DEXTROSE 0.75-8.25 % IT SOLN
INTRATHECAL | Status: DC | PRN
  Administered 2018-12-05: 2 mL via INTRATHECAL

## 2018-12-05 SURGICAL SUPPLY — 71 items
APL SKNCLS STERI-STRIP NONHPOA (GAUZE/BANDAGES/DRESSINGS) ×1
ATTUNE PS FEM RT SZ 5 CEM KNEE (Femur) ×1 IMPLANT
ATTUNE PSRP INSR SZ5 5 KNEE (Insert) ×1 IMPLANT
BAG SPEC THK2 15X12 ZIP CLS (MISCELLANEOUS) ×1
BAG ZIPLOCK 12X15 (MISCELLANEOUS) ×2 IMPLANT
BASE TIBIA ATTUNE KNEE SYS SZ6 (Knees) IMPLANT
BENZOIN TINCTURE PRP APPL 2/3 (GAUZE/BANDAGES/DRESSINGS) ×2 IMPLANT
BLADE SAG 18X100X1.27 (BLADE) ×2 IMPLANT
BLADE SAW SGTL 13.0X1.19X90.0M (BLADE) ×2 IMPLANT
BLADE SURG SZ10 CARB STEEL (BLADE) ×4 IMPLANT
BNDG CMPR MED 10X6 ELC LF (GAUZE/BANDAGES/DRESSINGS) ×1
BNDG ELASTIC 6X10 VLCR STRL LF (GAUZE/BANDAGES/DRESSINGS) ×1 IMPLANT
BOWL SMART MIX CTS (DISPOSABLE) ×2 IMPLANT
BSPLAT TIB 6 CMNT ROT PLAT STR (Knees) ×1 IMPLANT
CEMENT HV SMART SET (Cement) ×4 IMPLANT
COVER SURGICAL LIGHT HANDLE (MISCELLANEOUS) ×2 IMPLANT
COVER WAND RF STERILE (DRAPES) ×1 IMPLANT
CUFF TOURN SGL QUICK 34 (TOURNIQUET CUFF) ×2
CUFF TRNQT CYL 34X4.125X (TOURNIQUET CUFF) ×1 IMPLANT
DECANTER SPIKE VIAL GLASS SM (MISCELLANEOUS) IMPLANT
DRAPE ORTHO SPLIT 77X108 STRL (DRAPES) ×4
DRAPE SHEET LG 3/4 BI-LAMINATE (DRAPES) ×2 IMPLANT
DRAPE SURG ORHT 6 SPLT 77X108 (DRAPES) ×2 IMPLANT
DRAPE U-SHAPE 47X51 STRL (DRAPES) ×2 IMPLANT
DRSG ADAPTIC 3X8 NADH LF (GAUZE/BANDAGES/DRESSINGS) ×2 IMPLANT
DRSG AQUACEL AG ADV 3.5X10 (GAUZE/BANDAGES/DRESSINGS) ×2 IMPLANT
DURAPREP 26ML APPLICATOR (WOUND CARE) ×5 IMPLANT
ELECT REM PT RETURN 15FT ADLT (MISCELLANEOUS) ×2 IMPLANT
FACESHIELD WRAPAROUND (MASK) ×4 IMPLANT
FACESHIELD WRAPAROUND OR TEAM (MASK) ×2 IMPLANT
GLOVE BIO SURGEON STRL SZ7 (GLOVE) ×4 IMPLANT
GLOVE BIOGEL PI IND STRL 7.0 (GLOVE) ×1 IMPLANT
GLOVE BIOGEL PI IND STRL 7.5 (GLOVE) ×1 IMPLANT
GLOVE BIOGEL PI INDICATOR 7.0 (GLOVE) ×1
GLOVE BIOGEL PI INDICATOR 7.5 (GLOVE) ×1
GLOVE ECLIPSE 7.5 STRL STRAW (GLOVE) ×2 IMPLANT
GLOVE SS BIOGEL STRL SZ 7.5 (GLOVE) ×2 IMPLANT
GLOVE SUPERSENSE BIOGEL SZ 7.5 (GLOVE) ×2
GOWN STRL REUS W/ TWL LRG LVL3 (GOWN DISPOSABLE) ×1 IMPLANT
GOWN STRL REUS W/ TWL XL LVL3 (GOWN DISPOSABLE) ×1 IMPLANT
GOWN STRL REUS W/TWL LRG LVL3 (GOWN DISPOSABLE) ×2
GOWN STRL REUS W/TWL XL LVL3 (GOWN DISPOSABLE) ×2
HANDPIECE INTERPULSE COAX TIP (DISPOSABLE) ×2
HOLDER FOLEY CATH W/STRAP (MISCELLANEOUS) ×1 IMPLANT
HOOD PEEL AWAY FLYTE STAYCOOL (MISCELLANEOUS) ×2 IMPLANT
MANIFOLD NEPTUNE II (INSTRUMENTS) ×2 IMPLANT
MARKER PEN SURG W/LABELS BLK (STERILIZATION PRODUCTS) ×1 IMPLANT
NDL SAFETY ECLIPSE 18X1.5 (NEEDLE) IMPLANT
NEEDLE HYPO 18GX1.5 SHARP (NEEDLE)
NS IRRIG 1000ML POUR BTL (IV SOLUTION) ×2 IMPLANT
PACK TOTAL KNEE CUSTOM (KITS) ×2 IMPLANT
PATELLA MEDIAL ATTUN 35MM KNEE (Knees) ×1 IMPLANT
PIN STEINMAN FIXATION KNEE (PIN) ×1 IMPLANT
PIN THREADED HEADED SIGMA (PIN) ×1 IMPLANT
PROTECTOR NERVE ULNAR (MISCELLANEOUS) ×4 IMPLANT
SET HNDPC FAN SPRY TIP SCT (DISPOSABLE) ×1 IMPLANT
STAPLER VISISTAT 35W (STAPLE) IMPLANT
STRIP CLOSURE SKIN 1/2X4 (GAUZE/BANDAGES/DRESSINGS) ×2 IMPLANT
SUT MNCRL AB 3-0 PS2 18 (SUTURE) ×1 IMPLANT
SUT VIC AB 0 CT1 27 (SUTURE) ×6
SUT VIC AB 0 CT1 27XBRD ANTBC (SUTURE) ×3 IMPLANT
SUT VIC AB 1 CT1 27 (SUTURE) ×6
SUT VIC AB 1 CT1 27XBRD ANTBC (SUTURE) ×3 IMPLANT
SUT VIC AB 2-0 CT1 27 (SUTURE) ×6
SUT VIC AB 2-0 CT1 TAPERPNT 27 (SUTURE) ×3 IMPLANT
SYR CONTROL 10ML LL (SYRINGE) ×2 IMPLANT
TIBIA ATTUNE KNEE SYS BASE SZ6 (Knees) ×2 IMPLANT
TRAY CATH 16FR W/PLASTIC CATH (SET/KITS/TRAYS/PACK) ×2 IMPLANT
TRAY FOLEY MTR SLVR 16FR STAT (SET/KITS/TRAYS/PACK) ×2 IMPLANT
WATER STERILE IRR 1000ML POUR (IV SOLUTION) ×2 IMPLANT
WRAP KNEE MAXI GEL POST OP (GAUZE/BANDAGES/DRESSINGS) ×1 IMPLANT

## 2018-12-05 NOTE — Anesthesia Procedure Notes (Signed)
Anesthesia Regional Block: Adductor canal block   Pre-Anesthetic Checklist: ,, timeout performed, Correct Patient, Correct Site, Correct Laterality, Correct Procedure, Correct Position, site marked, Risks and benefits discussed,  Surgical consent,  Pre-op evaluation,  At surgeon's request and post-op pain management  Laterality: Left  Prep: chloraprep       Needles:  Injection technique: Single-shot  Needle Type: Echogenic Stimulator Needle     Needle Length: 5cm  Needle Gauge: 22     Additional Needles:   Procedures:, nerve stimulator,,, ultrasound used (permanent image in chart),,,,   Nerve Stimulator or Paresthesia:  Response: quadraceps contraction, 0.45 mA,   Additional Responses:   Narrative:  Start time: 12/05/2018 6:45 AM End time: 12/05/2018 7:00 AM Injection made incrementally with aspirations every 5 mL.  Performed by: Personally   Additional Notes: Functioning IV was confirmed and monitors were applied.  A 58mm 22ga Arrow echogenic stimulator needle was used. Sterile prep and drape,hand hygiene and sterile gloves were used. Ultrasound guidance: relevant anatomy identified, needle position confirmed, local anesthetic spread visualized around nerve(s)., vascular puncture avoided.  Image printed for medical record. Negative aspiration and negative test dose prior to incremental administration of local anesthetic. The patient tolerated the procedure well.

## 2018-12-05 NOTE — Op Note (Signed)
MRN:     [id number] DOB/AGE:    May 11, 1956 / 63 y.o.       OPERATIVE REPORT   DATE OF PROCEDURE:  12/05/2018      PREOPERATIVE DIAGNOSIS:   Primary Localized Osteoarthritis right Knee       Estimated body mass index is 32.5 kg/m as calculated from the following:   Height as of this encounter: 5\' 7"  (1.702 m).   Weight as of this encounter: 94.1 kg.                                                       POSTOPERATIVE DIAGNOSIS:   Same                                                                 PROCEDURE:  Procedure(s): RIGHT TOTAL KNEE ARTHROPLASTY Using Depuy Attune RP implants #5 Femur, #6Tibia, 47mm  RP bearing, 35 Patella    SURGEON: Johnathin Vanderschaaf A. Noemi Chapel, MD   ASSISTANT: Chriss Czar, PA-C, present and scrubbed throughout the case, critical for retraction, instrumentation, and closure.  ANESTHESIA: Spinal with Adductor Nerve Block  TOURNIQUET TIME: 60 minutes   COMPLICATIONS:  None       SPECIMENS: None   INDICATIONS FOR PROCEDURE: The patient has djd of the knee with varus deformities, XR shows bone on bone arthritis. Patient has failed all conservative measures including anti-inflammatory medicines, narcotics, attempts at exercise and weight loss, cortisone injections and viscosupplementation.  Risks and benefits of surgery have been discussed, questions answered.    DESCRIPTION OF PROCEDURE: The patient identified by armband, received right adductor canal block and IV antibiotics, in the holding area at [facility]. Patient taken to the operating room, appropriate anesthetic monitors were attached. Spinal anesthesia induced with the patient in supine position, Foley catheter was inserted. Tourniquet applied high to the operative thigh. Lateral post and foot positioner applied to the table, the lower extremity was then prepped and draped in usual sterile fashion from the ankle to the tourniquet. Time-out procedure was performed. The limb was wrapped with an Esmarch  bandage and the tourniquet inflated to 365 mmHg.   We began the operation by making a 6cm anterior midline incision. Small bleeders in the skin and the subcutaneous tissue identified and cauterized. Transverse retinaculum was incised and reflected medially and a medial parapatellar arthrotomy was accomplished. the patella was everted and theprepatellar fat pad resected. The superficial medial collateral ligament was then elevated from anterior to posterior along the proximal flare of the tibia and anterior half of the menisci resected. The knee was hyperflexed exposing bone on bone arthritis. Peripheral and notch osteophytes as well as the cruciate ligaments were then resected. We continued to work our way around posteriorly along the proximal tibia, and externally rotated the tibia subluxing it out from underneath the femur. A McHale retractor was placed through the notch and a lateral Hohmann retractor placed, and an external tibial guide was placed.  The tibial cutting guide was pinned into place allowing resection of 4 mm of bone medially and about 6 mm of bone laterally because of  her varus deformity.   Satisfied with the tibial resection, we then entered the distal femur 2 mm anterior to the PCL origin with the intramedullary guide rod and applied the distal femoral cutting guide set at 53mm, with 5 degrees of valgus. This was pinned along the epicondylar axis. At this point, the distal femoral cut was accomplished without difficulty. We then sized for a 5 femoral component and pinned the guide in 3 degrees of external rotation.The chamfer cutting guide was pinned into place. The anterior, posterior, and chamfer cuts were accomplished without difficulty followed by the  RP box cutting guide and the box cut. We also removed posterior osteophytes from the posterior femoral condyles. At this time, the knee was brought into full extension. We checked our extension and flexion gaps and found them symmetric at  5.  The patella thickness measured at 59m m. We set the cutting guide at 15 and removed the posterior patella sized for 35 button and drilled the lollipop. The knee was then once again hyperflexed exposing the proximal tibia. We sized for a # 6 tibial base plate, applied the smokestack and the conical reamer followed by the the Delta fin keel punch. We then hammered into place the  RP trial femoral component, inserted a trial bearing, trial patellar button, and took the knee through range of motion from 0-130 degrees. No thumb pressure was required for patellar tracking.   At this point, all trial components were removed, a double batch of DePuy HV cement  was mixed and applied to all bony metallic mating surfaces. In order, we hammered into place the tibial tray and removed excess cement, the femoral component and removed excess cement, a 5 mm  RP bearing was inserted, and the knee brought to full extension with compression. The patellar button was clamped into place, and excess cement removed. While the cement cured the wound was irrigated out with normal saline solution pulse lavage, and exparel was injected throughout the knee. Ligament stability and patellar tracking were checked and found to be excellent..   The parapatellar arthrotomy was closed with  #1 Vicryl suture. The subcutaneous tissue with 0 and 2-0 undyed Vicryl suture, and 4-0 Monocryl.. A dressing of Aquaseal, 4 x 4, dressing sponges, Webril, and Ace wrap applied. Needle and sponge count were correct times 2.The patient awakened, extubated, and taken to recovery room without difficulty. Vascular status was normal, pulses 2+ and symmetric.    Lorn Junes 01/10/2018, 8:56 AM

## 2018-12-05 NOTE — Anesthesia Procedure Notes (Signed)
Date/Time: 12/05/2018 7:19 AM Performed by: Talbot Grumbling, CRNA Oxygen Delivery Method: Simple face mask

## 2018-12-05 NOTE — Evaluation (Signed)
Physical Therapy Evaluation Patient Details Name: Nancy Steele MRN: [id number] DOB: 07/04/1956 Today's Date: 12/05/2018   History of Present Illness  Patient is a 63 y/o female s/p R TKA on 12/05/2018. PMH of L TKA, HTN.  Clinical Impression  Patient admitted s/p above listed procedure. Patient reports IND with mobility and ADLs prior to admission. Patient today requiring use of RW for mobility with min A/min guard throughout. Expected post-op pain and weakness with mobility with cueing to increase step length and weight shift to promote a normalized gait pattern. Patient up in chair with all needs met at conclusion of session. Will continue to follow with instruction on LE strengthening/ROM and stair navigation at next session.     Follow Up Recommendations Follow surgeon's recommendation for DC plan and follow-up therapies    Equipment Recommendations  None recommended by PT    Recommendations for Other Services       Precautions / Restrictions Precautions Precautions: Fall;Knee Precaution Comments: reviewed no pillow under knee Restrictions Weight Bearing Restrictions: No Other Position/Activity Restrictions: R LE WBAT      Mobility  Bed Mobility Overal bed mobility: Modified Independent                Transfers Overall transfer level: Needs assistance Equipment used: Rolling walker (2 wheeled) Transfers: Sit to/from Stand Sit to Stand: Min assist;Min guard         General transfer comment: light Min A to power up from bedside  Ambulation/Gait Ambulation/Gait assistance: Min guard Gait Distance (Feet): 110 Feet Assistive device: Rolling walker (2 wheeled) Gait Pattern/deviations: Step-to pattern;Step-through pattern;Decreased stride length;Decreased weight shift to right;Decreased stance time - right;Antalgic Gait velocity: decreased   General Gait Details: required use of RW for mobility; reduced heel strike; use of B UE to offload R LE   Stairs             Wheelchair Mobility    Modified Rankin (Stroke Patients Only)       Balance Overall balance assessment: Mild deficits observed, not formally tested                                           Pertinent Vitals/Pain Pain Assessment: 0-10 Pain Score: 3  Pain Location: R knee Pain Descriptors / Indicators: Aching;Discomfort;Guarding Pain Intervention(s): Limited activity within patient's tolerance;Monitored during session;Repositioned    Home Living Family/patient expects to be discharged to:: Private residence Living Arrangements: Spouse/significant other Available Help at Discharge: Family;Available 24 hours/day Type of Home: House Home Access: Stairs to enter Entrance Stairs-Rails: Psychiatric nurse of Steps: 5 Home Layout: Two level;Able to live on main level with bedroom/bathroom Home Equipment: Bedside commode;Walker - 2 wheels;Cane - single point;Shower seat;Grab bars - tub/shower      Prior Function Level of Independence: Independent               Hand Dominance        Extremity/Trunk Assessment   Upper Extremity Assessment Upper Extremity Assessment: Overall WFL for tasks assessed    Lower Extremity Assessment Lower Extremity Assessment: Generalized weakness;RLE deficits/detail RLE Deficits / Details: expected post-op pain and weakness RLE Sensation: WNL    Cervical / Trunk Assessment Cervical / Trunk Assessment: Normal  Communication   Communication: No difficulties  Cognition Arousal/Alertness: Awake/alert Behavior During Therapy: WFL for tasks assessed/performed Overall Cognitive Status: Within Functional Limits for tasks assessed  General Comments General comments (skin integrity, edema, etc.): husband present and supportive    Exercises     Assessment/Plan    PT Assessment Patient needs continued PT services  PT Problem List Decreased  strength;Decreased activity tolerance;Decreased range of motion;Decreased balance;Decreased mobility;Decreased knowledge of use of DME;Decreased safety awareness       PT Treatment Interventions DME instruction;Gait training;Stair training;Functional mobility training;Therapeutic activities;Therapeutic exercise;Balance training;Patient/family education    PT Goals (Current goals can be found in the Care Plan section)  Acute Rehab PT Goals Patient Stated Goal: return home soon PT Goal Formulation: With patient Time For Goal Achievement: 12/19/18 Potential to Achieve Goals: Good    Frequency 7X/week   Barriers to discharge        Co-evaluation               AM-PAC PT "6 Clicks" Mobility  Outcome Measure Help needed turning from your back to your side while in a flat bed without using bedrails?: A Little Help needed moving from lying on your back to sitting on the side of a flat bed without using bedrails?: A Little Help needed moving to and from a bed to a chair (including a wheelchair)?: A Little Help needed standing up from a chair using your arms (e.g., wheelchair or bedside chair)?: A Little Help needed to walk in hospital room?: A Little Help needed climbing 3-5 steps with a railing? : A Lot 6 Click Score: 17    End of Session Equipment Utilized During Treatment: Gait belt Activity Tolerance: Patient tolerated treatment well Patient left: in chair;with call bell/phone within reach;with chair alarm set Nurse Communication: Mobility status PT Visit Diagnosis: Unsteadiness on feet (R26.81);Other abnormalities of gait and mobility (R26.89);Muscle weakness (generalized) (M62.81)    Time: 1030-1314 PT Time Calculation (min) (ACUTE ONLY): 36 min   Charges:   PT Evaluation $PT Eval Moderate Complexity: 1 Mod PT Treatments $Gait Training: 8-22 mins       Lanney Gins, PT, DPT Supplemental Physical Therapist 12/05/18 3:14 PM Pager: [phone] Office:  [phone]

## 2018-12-05 NOTE — Anesthesia Postprocedure Evaluation (Signed)
Anesthesia Post Note  Patient: Nancy Steele  Procedure(s) Performed: RIGHT TOTAL KNEE ARTHROPLASTY (Right Knee)     Patient location during evaluation: PACU Anesthesia Type: Regional Level of consciousness: oriented and awake and alert Pain management: pain level controlled Vital Signs Assessment: post-procedure vital signs reviewed and stable Respiratory status: spontaneous breathing, respiratory function stable and patient connected to nasal cannula oxygen Cardiovascular status: blood pressure returned to baseline and stable Postop Assessment: no headache, no backache and no apparent nausea or vomiting Anesthetic complications: no    Last Vitals:  Vitals:   12/05/18 0938 12/05/18 0945  BP: 101/69 100/67  Pulse: 77 77  Resp: 12 10  Temp: 36.6 C   SpO2: 100% 100%    Last Pain:  Vitals:   12/05/18 0945  TempSrc:   PainSc: 0-No pain                 Kjell Brannen

## 2018-12-05 NOTE — Interval H&P Note (Signed)
History and Physical Interval Note:  12/05/2018 6:53 AM  Nancy Steele  has presented today for surgery, with the diagnosis of djd right knee  The various methods of treatment have been discussed with the patient and family. After consideration of risks, benefits and other options for treatment, the patient has consented to  Procedure(s): RIGHT TOTAL KNEE ARTHROPLASTY (Right) as a surgical intervention .  The patient's history has been reviewed, patient examined, no change in status, stable for surgery.  I have reviewed the patient's chart and labs.  Questions were answered to the patient's satisfaction.     Lorn Junes

## 2018-12-05 NOTE — Anesthesia Procedure Notes (Signed)
Spinal  Patient location during procedure: OR Start time: 12/05/2018 7:15 AM End time: 12/05/2018 7:00 AM Staffing Anesthesiologist: Janeece Riggers, MD Preanesthetic Checklist Completed: patient identified, site marked, surgical consent, pre-op evaluation, timeout performed, IV checked, risks and benefits discussed and monitors and equipment checked Spinal Block Patient position: sitting Prep: DuraPrep Patient monitoring: heart rate, cardiac monitor, continuous pulse ox and blood pressure Approach: midline Location: L4-5 Injection technique: single-shot Needle Needle type: Sprotte  Needle gauge: 24 G Needle length: 9 cm Assessment Sensory level: T4

## 2018-12-05 NOTE — Transfer of Care (Signed)
Immediate Anesthesia Transfer of Care Note  Patient: Nancy Steele  Procedure(s) Performed: RIGHT TOTAL KNEE ARTHROPLASTY (Right Knee)  Patient Location: PACU  Anesthesia Type:Spinal  Level of Consciousness: awake, alert  and oriented  Airway & Oxygen Therapy: Patient Spontanous Breathing and Patient connected to face mask oxygen  Post-op Assessment: Report given to RN and Post -op Vital signs reviewed and stable  Post vital signs: Reviewed and stable  Last Vitals:  Vitals Value Taken Time  BP    Temp    Pulse    Resp    SpO2      Last Pain:  Vitals:   12/05/18 0537  TempSrc: Oral         Complications: No apparent anesthesia complications

## 2018-12-05 NOTE — Discharge Instructions (Signed)

## 2018-12-05 NOTE — Brief Op Note (Signed)
12/05/2018  9:33 AM  PATIENT:  Nancy Steele  63 y.o. female  PRE-OPERATIVE DIAGNOSIS:  djd right knee  POST-OPERATIVE DIAGNOSIS:  djd right knee  PROCEDURE:  Procedure(s): RIGHT TOTAL KNEE ARTHROPLASTY (Right)  SURGEON:  Surgeon(s) and Role:    Elsie Saas, MD - Primary  PHYSICIAN ASSISTANT: Chriss Czar, PA-C  ASSISTANTS: OR staff x1   ANESTHESIA:   local, regional, spinal and IV sedation  EBL:  50 mL   BLOOD ADMINISTERED:none  DRAINS: none   LOCAL MEDICATIONS USED:  MARCAINE     SPECIMEN:  No Specimen  DISPOSITION OF SPECIMEN:  N/A  COUNTS:  YES  TOURNIQUET:   Total Tourniquet Time Documented: Thigh (Right) - 108 minutes Total: Thigh (Right) - 108 minutes   DICTATION: .Viviann Spare Dictation  PLAN OF CARE: Admit to inpatient   PATIENT DISPOSITION:  PACU - hemodynamically stable.   Delay start of Pharmacological VTE agent (>24hrs) due to surgical blood loss or risk of bleeding: yes

## 2018-12-06 ENCOUNTER — Encounter (HOSPITAL_COMMUNITY): Payer: Self-pay | Admitting: Orthopedic Surgery

## 2018-12-06 LAB — BASIC METABOLIC PANEL
Anion gap: 7 (ref 5–15)
BUN: 11 mg/dL (ref 6–20)
CO2: 24 mmol/L (ref 22–32)
Calcium: 8.5 mg/dL — ABNORMAL LOW (ref 8.9–10.3)
Chloride: 107 mmol/L (ref 98–111)
Creatinine, Ser: 0.67 mg/dL (ref 0.44–1.00)
GFR calc Af Amer: >60 mL/min (ref >60)
GFR calc non Af Amer: >60 mL/min (ref >60)
Glucose, Bld: 148 mg/dL — ABNORMAL HIGH (ref 70–99)
Potassium: 4.1 mmol/L (ref 3.5–5.1)
Sodium: 138 mmol/L (ref 135–145)

## 2018-12-06 LAB — CBC
HCT: 33.5 % — ABNORMAL LOW (ref 36.0–46.0)
Hemoglobin: 10.9 g/dL — ABNORMAL LOW (ref 12.0–15.0)
MCH: 30 pg (ref 26.0–34.0)
MCHC: 32.5 g/dL (ref 30.0–36.0)
MCV: 92.3 fL (ref 80.0–100.0)
PLATELETS: 186 10*3/uL (ref 150–400)
RBC: 3.63 MIL/uL — ABNORMAL LOW (ref 3.87–5.11)
RDW: 12.4 % (ref 11.5–15.5)
WBC: 10.1 10*3/uL (ref 4.0–10.5)
nRBC: 0 % (ref 0.0–0.2)

## 2018-12-06 MED ORDER — METHOCARBAMOL 500 MG PO TABS: 500.0000 mg | ORAL_TABLET | Freq: Three times a day (TID) | ORAL | 0 refills | Status: DC | PRN

## 2018-12-06 MED ORDER — ACETAMINOPHEN 500 MG PO TABS: 1000.0000 mg | ORAL_TABLET | Freq: Three times a day (TID) | ORAL | 0 refills | Status: AC

## 2018-12-06 MED ORDER — DOCUSATE SODIUM 100 MG PO CAPS: 100.0000 mg | ORAL_CAPSULE | Freq: Two times a day (BID) | ORAL | 0 refills | Status: DC

## 2018-12-06 MED ORDER — OXYCODONE HCL 5 MG PO TABS: 5.0000 mg | ORAL_TABLET | ORAL | 0 refills | Status: AC | PRN

## 2018-12-06 MED ORDER — ASPIRIN EC 81 MG PO TBEC: 81.0000 mg | DELAYED_RELEASE_TABLET | Freq: Two times a day (BID) | ORAL | 0 refills | Status: DC

## 2018-12-06 MED ORDER — ONDANSETRON HCL 4 MG PO TABS: 4.0000 mg | ORAL_TABLET | Freq: Three times a day (TID) | ORAL | 0 refills | Status: DC | PRN

## 2018-12-06 MED ORDER — GABAPENTIN 300 MG PO CAPS: 300.0000 mg | ORAL_CAPSULE | Freq: Two times a day (BID) | ORAL | 0 refills | Status: DC

## 2018-12-06 NOTE — Progress Notes (Signed)
Physical Therapy Treatment Patient Details Name: Nancy Steele MRN: [id number] DOB: 1956/09/21 Today's Date: 12/06/2018    History of Present Illness Patient is a 63 y/o female s/p R TKA on 12/05/2018. PMH of L TKA, HTN.    PT Comments    Pt ambulated in hallway and practiced safe stair technique.  Pt also performed LE exercises.  Pt feels ready for d/c home today and had no further questions.   Follow Up Recommendations  Follow surgeon's recommendation for DC plan and follow-up therapies     Equipment Recommendations  None recommended by PT    Recommendations for Other Services       Precautions / Restrictions Precautions Precautions: Fall;Knee Restrictions Other Position/Activity Restrictions: R LE WBAT    Mobility  Bed Mobility               General bed mobility comments: pt up in recliner on arrival  Transfers Overall transfer level: Needs assistance Equipment used: Rolling walker (2 wheeled) Transfers: Sit to/from Stand Sit to Stand: Supervision         General transfer comment: verbal cue for hand placement  Ambulation/Gait Ambulation/Gait assistance: Min guard;Supervision Gait Distance (Feet): 200 Feet Assistive device: Rolling walker (2 wheeled) Gait Pattern/deviations: Step-through pattern;Decreased stance time - right;Antalgic     General Gait Details: verbal cues for RW positioning, heel strike, step length   Stairs Stairs: Yes Stairs assistance: Min guard Stair Management: Step to pattern;Sideways;One rail Right Number of Stairs: 3 General stair comments: verbal cues for sequence, safety; performed well with holding one rail with both hands, performed twice   Wheelchair Mobility    Modified Rankin (Stroke Patients Only)       Balance                                            Cognition Arousal/Alertness: Awake/alert Behavior During Therapy: WFL for tasks assessed/performed Overall Cognitive Status:  Within Functional Limits for tasks assessed                                        Exercises      General Comments        Pertinent Vitals/Pain Pain Assessment: 0-10 Pain Score: 4  Pain Location: R knee Pain Descriptors / Indicators: Aching;Discomfort;Guarding Pain Intervention(s): Limited activity within patient's tolerance;Monitored during session;Repositioned;Ice applied    Home Living                      Prior Function            PT Goals (current goals can now be found in the care plan section) Progress towards PT goals: Progressing toward goals    Frequency           PT Plan Current plan remains appropriate    Co-evaluation              AM-PAC PT "6 Clicks" Mobility   Outcome Measure  Help needed turning from your back to your side while in a flat bed without using bedrails?: A Little Help needed moving from lying on your back to sitting on the side of a flat bed without using bedrails?: A Little Help needed moving to and from a bed to a chair (including a wheelchair)?:  A Little Help needed standing up from a chair using your arms (e.g., wheelchair or bedside chair)?: A Little Help needed to walk in hospital room?: A Little Help needed climbing 3-5 steps with a railing? : A Little 6 Click Score: 18    End of Session Equipment Utilized During Treatment: Gait belt Activity Tolerance: Patient tolerated treatment well Patient left: in chair;with call bell/phone within reach;with family/visitor present Nurse Communication: Mobility status PT Visit Diagnosis: Other abnormalities of gait and mobility (R26.89)     Time: 9093-1121 PT Time Calculation (min) (ACUTE ONLY): 19 min  Charges:  $Therapeutic Exercise: 8-22 mins                     Carmelia Bake, PT, DPT Acute Rehabilitation Services Office: [phone] Pager: [phone]   Trena Platt 12/06/2018, 12:36 PM

## 2018-12-06 NOTE — Care Management Note (Signed)
Case Management Note  Patient Details  Name: BRITANI BEATTIE MRN: [id number] Date of Birth: 1956/03/07  Subjective/Objective:                  discharged  Action/Plan: Has equip. Needed hhc through [facility]  Expected Discharge Date:  12/06/18               Expected Discharge Plan:  [place]  In-House Referral:     Discharge planning Services  CM Consult  Post Acute Care Choice:  Home Health Choice offered to:  Patient  DME Arranged:    DME Agency:     HH Arranged:  PT [place]:  [facility] (now [facility])  Status of Service:  Completed, signed off  If discussed at [employer] of Stay Meetings, dates discussed:    Additional Comments:  Leeroy Cha, RN 12/06/2018, 11:29 AM

## 2018-12-06 NOTE — Progress Notes (Signed)
    Subjective: Patient reports pain as mild.  Tolerating diet.  Urinating.   No CP, SOB.  OOB mobilizing well in hallway.  Objective:   VITALS:   Vitals:   12/05/18 1846 12/05/18 2116 12/06/18 0215 12/06/18 0451  BP: 137/81 120/75 101/71 120/74  Pulse: 79 80 85 79  Resp: 16 15 17 16   Temp: 98.2 F (36.8 C) 97.8 F (36.6 C) (!) 97.5 F (36.4 C) 97.9 F (36.6 C)  TempSrc: Oral Oral Oral Oral  SpO2: 98% 98% 96% 95%  Weight:      Height:       CBC Latest Ref Rng & Units 12/06/2018 11/28/2018 10/24/2018  WBC 4.0 - 10.5 K/uL 10.1 4.2 5.3  Hemoglobin 12.0 - 15.0 g/dL 10.9(L) 12.5 13.3  Hematocrit 36.0 - 46.0 % 33.5(L) 39.1 39.6  Platelets 150 - 400 K/uL 186 225 212   BMP Latest Ref Rng & Units 12/06/2018 11/28/2018 10/24/2018  Glucose 70 - 99 mg/dL 148(H) 94 85  BUN 6 - 20 mg/dL 11 18 17   Creatinine 0.44 - 1.00 mg/dL 0.67 0.83 0.88  BUN/Creat Ratio 6 - 22 (calc) - - NOT APPLICABLE  Sodium 734 - 145 mmol/L 138 141 142  Potassium 3.5 - 5.1 mmol/L 4.1 4.8 4.7  Chloride 98 - 111 mmol/L 107 108 106  CO2 22 - 32 mmol/L 24 27 29   Calcium 8.9 - 10.3 mg/dL 8.5(L) 9.0 9.7   Intake/Output      02/17 0701 - 02/18 0700 02/18 0701 - 02/19 0700   P.O. 1360    I.V. (mL/kg) 3360.5 (35.7)    IV Piggyback 400    Total Intake(mL/kg) 5120.5 (54.4)    Urine (mL/kg/hr) 2800 (1.2)    Blood 50    Total Output 2850    Net +2270.5            Physical Exam: General: NAD.  Supine in bed.  Calm, conversant. Resp: No increased wob Cardio: regular rate and rhythm ABD soft Neurologically intact MSK RLE: Neurovascularly intact Sensation intact distally Intact pulses distally Dorsiflexion/Plantar flexion intact Incision: dressing C/D/I   Assessment: 1 Day Post-Op  S/P Procedure(s) (LRB): RIGHT TOTAL KNEE ARTHROPLASTY (Right) by Dr. Noemi Chapel on 12/05/2018  Principal Problem:   Primary localized osteoarthritis of right knee Active Problems:   S/P total knee arthroplasty, left    Glaucoma   Primary osteoarthritis, status post right total knee arthroplasty Doing well postop day 1 Eating, drinking, and voiding Pain control Mobilizing well  Plan: Up with therapy Incentive Spirometry Elevate and Apply ice CPM, bone foam  Weight Bearing: Weight Bearing as Tolerated (WBAT)  Dressings: Maintain Aquacel.  Please apply thigh high TED hose to operative leg prior to discharge. VTE prophylaxis: Aspirin, SCDs, ambulation Dispo: Home today    Patient's anticipated LOS is less than 2 midnights, meeting these requirements: - Younger than 98 - Lives within 1 hour of care - Has a competent adult at home to recover with post-op recover - NO history of  - Chronic pain requiring opiods  - Diabetes  - Coronary Artery Disease  - Heart failure  - Heart attack  - Stroke  - DVT/VTE  - Cardiac arrhythmia  - Respiratory Failure/COPD  - Renal failure  - Anemia  - Advanced Liver disease    Prudencio Burly III, PA-C 12/06/2018, 7:30 AM

## 2018-12-06 NOTE — Discharge Summary (Signed)
Discharge Summary  Patient ID: Nancy Steele MRN: [id number] DOB/AGE: 1956/05/19 63 y.o.  Admit date: 12/05/2018 Discharge date: 12/06/2018  Admission Diagnoses:  Primary localized osteoarthritis of right knee  Discharge Diagnoses:  Principal Problem:   Primary localized osteoarthritis of right knee Active Problems:   S/P total knee arthroplasty, left   Glaucoma   Past Medical History:  Diagnosis Date  . Arthritis   . DDD (degenerative disc disease), cervical   . Family history of adverse reaction to anesthesia    brother slow to wake up  . Glaucoma, left eye   . Hypertension   . OA (osteoarthritis)    knees  . Seasonal allergies   . Wears glasses     Surgeries: Procedure(s): RIGHT TOTAL KNEE ARTHROPLASTY on 12/05/2018   Consultants (if any):   Discharged Condition: Improved  Hospital Course: Nancy Steele is an 63 y.o. female who was admitted 12/05/2018 with a diagnosis of Primary localized osteoarthritis of right knee and went to the operating room on 12/05/2018 and underwent the above named procedures.    She was given perioperative antibiotics:  Anti-infectives (From admission, onward)   Start     Dose/Rate Route Frequency Ordered Stop   12/05/18 1315  ceFAZolin (ANCEF) IVPB 2g/100 mL premix     2 g 200 mL/hr over 30 Minutes Intravenous Every 6 hours 12/05/18 1201 12/05/18 1912   12/05/18 0600  ceFAZolin (ANCEF) IVPB 2g/100 mL premix     2 g 200 mL/hr over 30 Minutes Intravenous On call to O.R. 12/05/18 0534 12/05/18 0730    .  She was given sequential compression devices, early ambulation, and aspirin for DVT prophylaxis.  She benefited maximally from the hospital stay and there were no complications.    Recent vital signs:  Vitals:   12/06/18 0215 12/06/18 0451  BP: 101/71 120/74  Pulse: 85 79  Resp: 17 16  Temp: (!) 97.5 F (36.4 C) 97.9 F (36.6 C)  SpO2: 96% 95%    Recent laboratory studies:  Lab Results  Component Value Date   HGB  10.9 (L) 12/06/2018   HGB 12.5 11/28/2018   HGB 13.3 10/24/2018   Lab Results  Component Value Date   WBC 10.1 12/06/2018   PLT 186 12/06/2018   Lab Results  Component Value Date   INR 0.94 11/28/2018   Lab Results  Component Value Date   NA 138 12/06/2018   K 4.1 12/06/2018   CL 107 12/06/2018   CO2 24 12/06/2018   BUN 11 12/06/2018   CREATININE 0.67 12/06/2018   GLUCOSE 148 (H) 12/06/2018    Discharge Medications:   Allergies as of 12/06/2018      Reactions   Penicillins Swelling, Rash, Other (See Comments)   #####Patient has had Ancef for multiple surgeries without a reaction######   SWELLING OF THE THROAT, OTHER CHILDHOOD REACTION PCN REACTION WITH IMMEDIATE RASH, FACIAL/TONGUE/THROAT SWELLING, SOB, OR LIGHTHEADEDNESS WITH HYPOTENSION:  #  #  #  YES  #  #  #  Has patient had a PCN reaction causing severe rash involving mucus membranes or skin necrosis: Unknown Has patient had a PCN reaction that required hospitalization: No Has patient had a PCN reaction occurring within the last 10 years: No     Sulfa Antibiotics Nausea And Vomiting   Sulfasalazine Nausea And Vomiting   Vancomycin Hives      Medication List    TAKE these medications   acetaminophen 500 MG tablet Commonly known as:  TYLENOL  Take 2 tablets (1,000 mg total) by mouth every 8 (eight) hours for 14 days. For Pain.   aspirin EC 81 MG tablet Take 1 tablet (81 mg total) by mouth 2 (two) times daily. For DVT prophylaxis for 30 days after surgery.   CALTRATE 600+D 600-800 MG-UNIT Tabs Generic drug:  Calcium Carb-Cholecalciferol Take 1 tablet by mouth daily.   COMBIGAN 0.2-0.5 % ophthalmic solution Generic drug:  brimonidine-timolol Place 1 drop into the left eye 2 (two) times daily.   docusate sodium 100 MG capsule Commonly known as:  COLACE Take 1 capsule (100 mg total) by mouth 2 (two) times daily. To prevent constipation while taking pain medication.   gabapentin 300 MG capsule Commonly known  as:  NEURONTIN Take 1 capsule (300 mg total) by mouth 2 (two) times daily for 14 days. For 2 weeks post op for pain.   lisinopril 10 MG tablet Commonly known as:  PRINIVIL,ZESTRIL TAKE 1 TABLET BY MOUTH EVERY DAY   methocarbamol 500 MG tablet Commonly known as:  ROBAXIN Take 1 tablet (500 mg total) by mouth every 8 (eight) hours as needed for muscle spasms.   multivitamin with minerals Tabs tablet Take 1 tablet by mouth daily.   ondansetron 4 MG tablet Commonly known as:  ZOFRAN Take 1 tablet (4 mg total) by mouth every 8 (eight) hours as needed for nausea or vomiting.   oxyCODONE 5 MG immediate release tablet Commonly known as:  ROXICODONE Take 1 tablet (5 mg total) by mouth every 4 (four) hours as needed for up to 7 days for breakthrough pain.   SAXENDA 18 MG/3ML Sopn Generic drug:  Liraglutide -Weight Management Inject 0.6 mg into the skin daily.   SYSTANE ULTRA 0.4-0.3 % Soln Generic drug:  Polyethyl Glycol-Propyl Glycol Place 1-2 drops into both eyes 3 (three) times daily as needed (for dry/irritated eyes.).       Diagnostic Studies: No results found.  Disposition: Discharge disposition: 01-Home or Self Care       Discharge Instructions    Discharge patient   Complete by:  As directed    Discharge disposition:  01-Home or Self Care   Discharge patient date:  12/06/2018      Follow-up Information    Elsie Saas, MD. Schedule an appointment as soon as possible for a visit in 2 weeks.   Specialty:  Orthopedic Surgery Why:  or as previously scheduled Contact information: [address] [place] [address] [phone]            Signed: Prudencio Burly III PA-C 12/06/2018, 7:38 AM

## 2018-12-07 DIAGNOSIS — Z96651 Presence of right artificial knee joint: Secondary | ICD-10-CM | POA: Diagnosis not present

## 2018-12-07 DIAGNOSIS — M1711 Unilateral primary osteoarthritis, right knee: Secondary | ICD-10-CM | POA: Diagnosis not present

## 2018-12-08 DIAGNOSIS — M5030 Other cervical disc degeneration, unspecified cervical region: Secondary | ICD-10-CM | POA: Diagnosis not present

## 2018-12-08 DIAGNOSIS — Z471 Aftercare following joint replacement surgery: Secondary | ICD-10-CM | POA: Diagnosis not present

## 2018-12-08 DIAGNOSIS — I10 Essential (primary) hypertension: Secondary | ICD-10-CM | POA: Diagnosis not present

## 2018-12-09 ENCOUNTER — Other Ambulatory Visit: Payer: Self-pay | Admitting: Physician Assistant

## 2018-12-09 DIAGNOSIS — M1711 Unilateral primary osteoarthritis, right knee: Secondary | ICD-10-CM

## 2018-12-09 DIAGNOSIS — Z96651 Presence of right artificial knee joint: Secondary | ICD-10-CM

## 2018-12-12 DIAGNOSIS — I10 Essential (primary) hypertension: Secondary | ICD-10-CM | POA: Diagnosis not present

## 2018-12-12 DIAGNOSIS — M5030 Other cervical disc degeneration, unspecified cervical region: Secondary | ICD-10-CM | POA: Diagnosis not present

## 2018-12-12 DIAGNOSIS — Z471 Aftercare following joint replacement surgery: Secondary | ICD-10-CM | POA: Diagnosis not present

## 2018-12-14 DIAGNOSIS — M5030 Other cervical disc degeneration, unspecified cervical region: Secondary | ICD-10-CM | POA: Diagnosis not present

## 2018-12-14 DIAGNOSIS — Z471 Aftercare following joint replacement surgery: Secondary | ICD-10-CM | POA: Diagnosis not present

## 2018-12-14 DIAGNOSIS — I10 Essential (primary) hypertension: Secondary | ICD-10-CM | POA: Diagnosis not present

## 2018-12-16 DIAGNOSIS — I10 Essential (primary) hypertension: Secondary | ICD-10-CM | POA: Diagnosis not present

## 2018-12-16 DIAGNOSIS — Z471 Aftercare following joint replacement surgery: Secondary | ICD-10-CM | POA: Diagnosis not present

## 2018-12-16 DIAGNOSIS — M5030 Other cervical disc degeneration, unspecified cervical region: Secondary | ICD-10-CM | POA: Diagnosis not present

## 2018-12-19 ENCOUNTER — Other Ambulatory Visit: Payer: Self-pay

## 2018-12-19 ENCOUNTER — Encounter (HOSPITAL_COMMUNITY): Payer: Self-pay

## 2018-12-19 ENCOUNTER — Ambulatory Visit (HOSPITAL_COMMUNITY): Payer: 59 | Attending: Physician Assistant

## 2018-12-19 DIAGNOSIS — R600 Localized edema: Secondary | ICD-10-CM | POA: Diagnosis present

## 2018-12-19 DIAGNOSIS — M25561 Pain in right knee: Secondary | ICD-10-CM

## 2018-12-19 DIAGNOSIS — M6281 Muscle weakness (generalized): Secondary | ICD-10-CM

## 2018-12-19 DIAGNOSIS — R2689 Other abnormalities of gait and mobility: Secondary | ICD-10-CM | POA: Diagnosis present

## 2018-12-19 DIAGNOSIS — M25661 Stiffness of right knee, not elsewhere classified: Secondary | ICD-10-CM | POA: Diagnosis not present

## 2018-12-19 NOTE — Patient Instructions (Signed)
Step 1  Step 2  Step 3  Supine Heel Slides reps: 10-15  sets: 2-3  hold: 5 seconds  daily: 1  weekly: 7 Setup  Begin lying on your back with your legs bent and your hands resting on your belly. Movement  Take a deep breath in, as you exhale, tighten your abdominal muscles. Continue to breathe normally as you hold the contraction and slowly slide one leg out straight. Slide back to start position, relax contraction and then repeat with the other leg. Tip  Remember to breathe normally through out this exercise as you keep your abdominals tight. Step 1  Step 2  Supine Quad Set reps: 10-15  sets: 2-3  hold: 5 seconds  daily: 1  weekly: 7 Setup  Begin lying on your back with one knee bent and your other leg straight with your knee resting on a towel roll. Movement  Gently squeeze your thigh muscles, pushing the back of your knee down into the towel. Tip  Make sure to keep your back flat against the floor during the exercise. Step 1  Step 2  Seated Knee Flexion Stretch reps: 3  sets: 2-3  hold: 30 seconds  daily: 1  weekly: 7 Setup  Begin by sitting upright in a chair with your feet positioned shoulder width apart with one foot forward. Movement  Keeping both feet flat on the floor, move your other foot back until you feel a gentle stretch.  Tip  Make sure to keep your foot on the floor and pointing straight forward.

## 2018-12-19 NOTE — Therapy (Signed)
[place], [place], [address] Phone: [phone]   Fax:  [phone]  Physical Therapy Evaluation  Patient Details  Name: Nancy Steele MRN: [id number] Date of Birth: 02/21/1956 Referring Provider (PT):  Matthew Saras, [place]   Encounter Date: 12/19/2018  PT End of Session - 12/19/18 1113    Visit Number  1    Number of Visits  18    Date for PT Re-Evaluation  01/30/19    Authorization Type  [employer]  (no auth requires, 60 visit limit)    Authorization Time Period  12/19/2018 - 01/30/2019    Authorization - Visit Number  1    Authorization - Number of Visits  60    PT Start Time  1034    PT Stop Time  1115    PT Time Calculation (min)  41 min    Activity Tolerance  Patient tolerated treatment well    Behavior During Therapy  [facility] for tasks assessed/performed       Past Medical History:  Diagnosis Date  . Arthritis   . DDD (degenerative disc disease), cervical   . Family history of adverse reaction to anesthesia    brother slow to wake up  . Glaucoma, left eye   . Hypertension   . OA (osteoarthritis)    knees  . Seasonal allergies   . Wears glasses     Past Surgical History:  Procedure Laterality Date  . CARPAL TUNNEL RELEASE Bilateral 2014  . CATARACT EXTRACTION W/ INTRAOCULAR LENS IMPLANT Left 2014  . CERVICAL FUSION  2000   C5 -- C6  . CHONDROPLASTY Left 10/17/2014   Procedure: CHONDROPLASTY;  Surgeon: Lorn Junes, MD;  Location: [facility];  Service: Orthopedics;  Laterality: Left;  . COLONOSCOPY    . KNEE ARTHROSCOPY Bilateral left , prior to 2015;  right 2009 approx.  Marland Kitchen KNEE ARTHROSCOPY WITH LATERAL MENISECTOMY Left 10/17/2014   Procedure: KNEE ARTHROSCOPY WITH LATERAL MENISECTOMY;  Surgeon: Lorn Junes, MD;  Location: [facility];  Service: Orthopedics;  Laterality: Left;  . KNEE ARTHROSCOPY WITH MEDIAL MENISECTOMY Left 10/17/2014   Procedure: LEFT KNEE ARTHROSCOPY WITH MEDIAL MENISCECTOMY;  Surgeon: Lorn Junes, MD;  Location: [facility];  Service: Orthopedics;  Laterality: Left;  . POLYPECTOMY    . TOTAL KNEE ARTHROPLASTY Left 12/06/2017   Procedure: LEFT TOTAL KNEE ARTHROPLASTY;  Surgeon: Elsie Saas, MD;  Location: [facility];  Service: Orthopedics;  Laterality: Left;  . TOTAL KNEE ARTHROPLASTY Right 12/05/2018   Procedure: RIGHT TOTAL KNEE ARTHROPLASTY;  Surgeon: Elsie Saas, MD;  Location: [facility];  Service: Orthopedics;  Laterality: Right;    There were no vitals filed for this visit.   Subjective Assessment - 12/19/18 1036    Subjective  Nancy Steele reports she had her Rt TKA performed on 12/05/2018 by Herbie Baltimore A. Noemi Chapel, MD and states she finished with [facility] last Friday. She has been using her CPM machine 3-4x/day for 1 hour and icing 3-4x/day as well. She has noticed that she has more Rt knee pain along her incision and feels like her lower leg is cramping at times which did not happen after her Lt TKA last year. She reports her Lt knee is feeling good and she denies any pain or difficulty with it today. She reports her bandage is still on and she sees her MD for follow up today at [facility].    Limitations  House hold  activities;Standing;Walking;Sitting    How long can you sit comfortably?  in a car probably 20 minutes    How long can you stand comfortably?  10 minutes    How long can you walk comfortably?  just around the house about 15 minutes    Currently in Pain?  Yes    Pain Score  4     Pain Location  Knee    Pain Orientation  Right    Pain Descriptors / Indicators  Aching;Sore    Pain Type  Surgical pain;Acute pain    Pain Onset  1 to 4 weeks ago    Pain Frequency  Intermittent    Aggravating Factors   sitting still for too long    Pain Relieving Factors  ice, medicaiton    Effect of Pain on Daily Activities  moderate         [facility] PT Assessment - 12/19/18 0001      Assessment    Medical Diagnosis  Rt TKA    Referring Provider (PT)   Matthew Saras, PA-C    Onset Date/Surgical Date  12/05/18    Next MD Visit  12/19/2018    Prior Therapy  [facility]      Precautions   Precautions  None      Restrictions   Weight Bearing Restrictions  No      Balance Screen   Has the patient fallen in the past 6 months  No      [place] residence    Living Arrangements  Spouse/significant other    Available Help at Discharge  Family    Type of [place] to enter    Entrance Stairs-Number of Steps  5    Entrance Stairs-Rails  Right;Left;Cannot reach both    [place]  Two level    Alternate Level Stairs-Number of Steps  1 flight (13)    Home Equipment  Walker - 2 wheels;Cane - single point;Shower seat      Prior Function   Level of Independence  Independent    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     Leisure  walking/ridign bike      Cognition   Overall Cognitive Status  Within Functional Limits for tasks assessed      Observation/Other Assessments   Focus on Therapeutic Outcomes (FOTO)   63% limited      Observation/Other Assessments-Edema    Edema  Circumferential      Circumferential Edema   Circumferential - Right  18.25 inches at joint line      Functional Tests   Functional tests  Single leg stance      Single Leg Stance   Comments  Rt LE = 11 seconds, Lt LE = 30 seconds      ROM / Strength   AROM / PROM / Strength  AROM;Strength;PROM      AROM   AROM Assessment Site  Knee    Right/Left Knee  Right;Left    Right Knee Extension  11    Right Knee Flexion  82    Left Knee Extension  0    Left Knee Flexion  122      PROM   PROM Assessment Site  Knee    Right/Left Knee  Right    Right Knee Extension  8  Right Knee Flexion  90      Strength   Strength Assessment Site  Hip;Knee;Ankle    Right Hip  Flexion  4/5    Right Hip Extension  4/5    Right Hip ABduction  4/5    Left Hip Flexion  4+/5    Left Hip Extension  4+/5    Left Hip ABduction  4+/5    Right/Left Knee  Right;Left    Right Knee Flexion  4-/5   painful   Right Knee Extension  4+/5    Left Knee Flexion  4+/5    Left Knee Extension  5/5    Right Ankle Dorsiflexion  4+/5    Left Ankle Dorsiflexion  5/5      Transfers   Five time sit to stand comments   12.4 seconds without UE use; Rt knee extended with unequal weight bearing      Ambulation/Gait   Ambulation/Gait  Yes    Ambulation/Gait Assistance  6: Modified independent (Device/Increase time)    Ambulation Distance (Feet)  428 Feet   2MWT   Assistive device  Straight cane    Ambulation Surface  Level;Indoor    Gait velocity  1.08 m/s    Stairs  Yes    Stairs Assistance  6: Modified independent (Device/Increase time)    Stair Management Technique  One rail Right;Step to pattern;With cane    Number of Stairs  4    Height of Stairs  6       Objective measurements completed on examination: See above findings.     PT Education - 12/19/18 1119    Education Details  Educated on exam findings and on initial HEP. Educated on appropriate POC.     Person(s) Educated  Patient    Methods  Explanation;Handout    Comprehension  Verbalized understanding       PT Short Term Goals - 12/19/18 1112      PT SHORT TERM GOAL #1   Title  Patient will demonstrate understanding and report regular compliance with HEP.     Time  3    Period  Weeks    Status  New    Target Date  01/09/19      PT SHORT TERM GOAL #2   Title  Patient will demonstrate right knee AROM extension/flexion of at least 5-100 degrees in order to assist patient with more normalized gait and stair ambulation.     Time  3    Period  Weeks    Status  New      PT SHORT TERM GOAL #3   Title  Patient will demonstrate improvement of 1/2 MMT grade in all deficient tested musculature in order to assist  patient in performing ambulation and stair ambulation with improved mechanics.     Time  3    Period  Weeks    Status  New        PT Long Term Goals - 12/19/18 1331      PT LONG TERM GOAL #1   Title  Patient will demonstrate right knee AROM extension/flexion of at least 0-120 degrees in order to assist patient with ambulation, stairs, and squatting.     Time  6    Period  Weeks    Status  New    Target Date  01/30/19      PT LONG TERM GOAL #2   Title  Patient will demonstrate improvement of 1 MMT grade or be 5/5 in all  deficient tested musculature in order to assist patient in performing ambulation and stair ambulation with improved mechanics.     Time  6    Period  Weeks    Status  New      PT LONG TERM GOAL #3   Title  Patient will demonstrate ability to perform the 5 times sit to stand test in 10 seconds or less with equal weight bearing indicating improved balance and decreased risk of falls.    Time  6    Period  Weeks    Status  New      PT LONG TERM GOAL #4   Title  Patient will improve FOTO score to 39% or less limitation related to Rt knee impairments to indicate significant improvement in functional mobility.    Time  6    Period  Weeks    Status  New      PT LONG TERM GOAL #5   Title  Patient will demonstrate ability to perform ascending and descending 13 stairs with single handrail with reciprocal gait pattern for improved mechanics and navigation of stairs at patient's home.     Time  6    Period  Weeks    Status  New         Plan - 12/19/18 1340    Clinical Impression Statement  Ms. Mondesir presents to physical therapy for evaluation 2 weeks s/p Rt TKA. Her Rt knee is swollen but there is no warmth, redness, or odor and surgical bandage is intact. She presents with limited AROM from 11-82 degrees and PROM from 8-90 degrees. Strength is limited in Rt LE and patient reports pain with Rt knee flexion testing. She is able to complete functional testing today  in normal time frame however demonstrates impaired weight bearing and is unable to fully flex Rt knee to weight bear in sit<>Stand transfers. Ms. Liddicoat will benefit from skilled PT interventions to address impairments and progress independence with functional mobility.     Examination-Activity Limitations  Stand;Stairs;Squat;Sit    Examination-Participation Restrictions  Community Activity;Driving    Stability/Clinical Decision Making  Stable/Uncomplicated    Clinical Decision Making  Low    Rehab Potential  Good    PT Frequency  3x / week    PT Duration  6 weeks    PT Treatment/Interventions  ADLs/Self Care Home Management;Aquatic Therapy;Cryotherapy;Electrical Stimulation;Moist Heat;DME Instruction;Gait training;Stair training;Functional mobility training;Therapeutic activities;Therapeutic exercise;Balance training;Neuromuscular re-education;Patient/family education;Manual techniques;Scar mobilization;Passive range of motion;Taping    PT Next Visit Plan  Review eval and goals. Manual interventions for edema and joint mobilization for mobility. Exercises should focus on ROM.    PT Home Exercise Plan  Eval: heel slide, quad set, assisted flexion stretch    Consulted and Agree with Plan of Care  Patient       Patient will benefit from skilled therapeutic intervention in order to improve the following deficits and impairments:  Abnormal gait, Decreased activity tolerance, Decreased endurance, Decreased range of motion, Decreased strength, Pain, Hypomobility, Decreased balance, Decreased mobility, Decreased scar mobility, Difficulty walking, Increased edema, Impaired flexibility  Visit Diagnosis: Acute pain of right knee  Stiffness of right knee, not elsewhere classified  Muscle weakness (generalized)  Other abnormalities of gait and mobility  Localized edema     Problem List Patient Active Problem List   Diagnosis Date Noted  . S/P total knee arthroplasty, left 11/23/2018  .  Glaucoma 11/23/2018  . Primary localized osteoarthritis of right knee 12/06/2017  . Primary localized  osteoarthritis of left knee 11/24/2017  . Acute medial meniscus tear of left knee   . Hypertension     Kipp Brood, PT, DPT, [facility] Physical Therapist with [facility]  12/19/2018 1:47 PM    [place] [address] [place], [place], [address] Phone: [phone]   Fax:  [phone]  Name: Nancy Steele MRN: [id number] Date of Birth: 10-Nov-1955

## 2018-12-21 ENCOUNTER — Ambulatory Visit (HOSPITAL_COMMUNITY): Payer: 59

## 2018-12-21 ENCOUNTER — Encounter (HOSPITAL_COMMUNITY): Payer: Self-pay

## 2018-12-21 DIAGNOSIS — M25661 Stiffness of right knee, not elsewhere classified: Secondary | ICD-10-CM

## 2018-12-21 DIAGNOSIS — R2689 Other abnormalities of gait and mobility: Secondary | ICD-10-CM

## 2018-12-21 DIAGNOSIS — M25561 Pain in right knee: Secondary | ICD-10-CM

## 2018-12-21 DIAGNOSIS — R600 Localized edema: Secondary | ICD-10-CM

## 2018-12-21 DIAGNOSIS — M6281 Muscle weakness (generalized): Secondary | ICD-10-CM

## 2018-12-21 NOTE — Therapy (Signed)
[place], [place], [address] Phone: [phone]   Fax:  [phone]  Physical Therapy Treatment  Patient Details  Name: Nancy Steele MRN: [id number] Date of Birth: [birth year] Referring Provider (PT):  Matthew Saras, [place]   Encounter Date: 12/21/2018  PT End of Session - 12/21/18 1037    Visit Number  2    Number of Visits  18    Date for PT Re-Evaluation  01/30/19   Minireassess  01/09/19   Authorization Type  [employer]  (no auth requires, 60 visit limit)    Authorization Time Period  12/19/2018 - 01/30/2019    Authorization - Visit Number  2    Authorization - Number of Visits  60    PT Start Time  1034    PT Stop Time  1113    PT Time Calculation (min)  39 min    Activity Tolerance  Patient tolerated treatment well    Behavior During Therapy  [facility] for tasks assessed/performed       Past Medical History:  Diagnosis Date  . Arthritis   . DDD (degenerative disc disease), cervical   . Family history of adverse reaction to anesthesia    brother slow to wake up  . Glaucoma, left eye   . Hypertension   . OA (osteoarthritis)    knees  . Seasonal allergies   . Wears glasses     Past Surgical History:  Procedure Laterality Date  . CARPAL TUNNEL RELEASE Bilateral 2014  . CATARACT EXTRACTION W/ INTRAOCULAR LENS IMPLANT Left 2014  . CERVICAL FUSION  2000   C5 -- C6  . CHONDROPLASTY Left 10/17/2014   Procedure: CHONDROPLASTY;  Surgeon: Lorn Junes, MD;  Location: [place];  Service: Orthopedics;  Laterality: Left;  . COLONOSCOPY    . KNEE ARTHROSCOPY Bilateral left , prior to 2015;  right 2009 approx.  Marland Kitchen KNEE ARTHROSCOPY WITH LATERAL MENISECTOMY Left 10/17/2014   Procedure: KNEE ARTHROSCOPY WITH LATERAL MENISECTOMY;  Surgeon: Lorn Junes, MD;  Location: [place];  Service: Orthopedics;  Laterality: Left;  . KNEE ARTHROSCOPY WITH MEDIAL MENISECTOMY Left  10/17/2014   Procedure: LEFT KNEE ARTHROSCOPY WITH MEDIAL MENISCECTOMY;  Surgeon: Lorn Junes, MD;  Location: [place];  Service: Orthopedics;  Laterality: Left;  . POLYPECTOMY    . TOTAL KNEE ARTHROPLASTY Left 12/06/2017   Procedure: LEFT TOTAL KNEE ARTHROPLASTY;  Surgeon: Elsie Saas, MD;  Location: [place];  Service: Orthopedics;  Laterality: Left;  . TOTAL KNEE ARTHROPLASTY Right 12/05/2018   Procedure: RIGHT TOTAL KNEE ARTHROPLASTY;  Surgeon: Elsie Saas, MD;  Location: [facility];  Service: Orthopedics;  Laterality: Right;    There were no vitals filed for this visit.  Subjective Assessment - 12/21/18 1035    Subjective  Pt reports new medication assisted with sleep.  Stated knee is feeling stiff today.  Pain scale 2-3/10 achey stiffness.      Currently in Pain?  Yes    Pain Score  3     Pain Location  Knee    Pain Orientation  Right    Pain Descriptors / Indicators  Aching;Tightness    Pain Type  Surgical pain;Acute pain    Pain Onset  1 to 4 weeks ago    Pain Frequency  Intermittent    Aggravating Factors   sitting still for too long    Pain Relieving Factors  ice, medication  Effect of Pain on Daily Activities  moderate         [facility] PT Assessment - 12/21/18 0001      Assessment   Medical Diagnosis  Rt TKA    Referring Provider (PT)   Matthew Saras, PA-C    Onset Date/Surgical Date  12/05/18    Next MD Visit  01/16/19    Prior Therapy  [facility]                   [facility] Adult PT Treatment/Exercise - 12/21/18 0001      Exercises   Exercises  Knee/Hip      Knee/Hip Exercises: Stretches   Active Hamstring Stretch  3 reps;30 seconds    Active Hamstring Stretch Limitations  supine wiht rope    Knee: Self-Stretch to increase Flexion  5 reps;10 seconds    Knee: Self-Stretch Limitations  knee drive on 23FT step 5x 10" holds    Gastroc Stretch  3 reps;30 seconds    Gastroc Stretch Limitations  slant board      Knee/Hip Exercises:  Standing   Terminal Knee Extension  10 reps;Theraband    Theraband Level (Terminal Knee Extension)  Level 2 (Red)    Terminal Knee Extension Limitations  5" holds      Knee/Hip Exercises: Supine   Quad Sets  15 reps    Short Arc Quad Sets  15 reps    Short Arc Quad Sets Limitations  3" holds    Heel Slides  10 reps    Knee Extension  AROM    Knee Extension Limitations  8    Knee Flexion  AROM    Knee Flexion Limitations  96      Manual Therapy   Manual Therapy  Edema management    Manual therapy comments  Manual therapy complete separate than rest of tx    Edema Management  Retrograde massage with LE elevated             PT Education - 12/21/18 1045    Education Details  Reviewed goals and assured complaince with HEP, able to demonstrate appropriate mechanics with all exercises.     Person(s) Educated  Patient    Methods  Explanation    Comprehension  Verbalized understanding;Returned demonstration       PT Short Term Goals - 12/19/18 1112      PT SHORT TERM GOAL #1   Title  Patient will demonstrate understanding and report regular compliance with HEP.     Time  3    Period  Weeks    Status  New    Target Date  01/09/19      PT SHORT TERM GOAL #2   Title  Patient will demonstrate right knee AROM extension/flexion of at least 5-100 degrees in order to assist patient with more normalized gait and stair ambulation.     Time  3    Period  Weeks    Status  New      PT SHORT TERM GOAL #3   Title  Patient will demonstrate improvement of 1/2 MMT grade in all deficient tested musculature in order to assist patient in performing ambulation and stair ambulation with improved mechanics.     Time  3    Period  Weeks    Status  New        PT Long Term Goals - 12/19/18 1331      PT LONG TERM GOAL #1   Title  Patient  will demonstrate right knee AROM extension/flexion of at least 0-120 degrees in order to assist patient with ambulation, stairs, and squatting.     Time   6    Period  Weeks    Status  New    Target Date  01/30/19      PT LONG TERM GOAL #2   Title  Patient will demonstrate improvement of 1 MMT grade or be 5/5 in all deficient tested musculature in order to assist patient in performing ambulation and stair ambulation with improved mechanics.     Time  6    Period  Weeks    Status  New      PT LONG TERM GOAL #3   Title  Patient will demonstrate ability to perform the 5 times sit to stand test in 10 seconds or less with equal weight bearing indicating improved balance and decreased risk of falls.    Time  6    Period  Weeks    Status  New      PT LONG TERM GOAL #4   Title  Patient will improve FOTO score to 39% or less limitation related to Rt knee impairments to indicate significant improvement in functional mobility.    Time  6    Period  Weeks    Status  New      PT LONG TERM GOAL #5   Title  Patient will demonstrate ability to perform ascending and descending 13 stairs with single handrail with reciprocal gait pattern for improved mechanics and navigation of stairs at patient's home.     Time  6    Period  Weeks    Status  New            Plan - 12/21/18 1201    Clinical Impression Statement  Reviewed goals and assured compliance with HEP, pt able to recall and demonstrate all exercises correctly, was encouraged to use towel under knee for quad sets to reduce gluteal compensation.  Therex focus on knee mobility with quad strengthening exercises and stretches.  Added manual retrograde massage for edema control at EOS.  Noted scar tissue adhesions, will begin manual when incision fully healed.  Improved AROM to 8-96 degrees (was 11-82 degrees last session).  No reports of increased pain through session.      Rehab Potential  Good    PT Frequency  3x / week    PT Duration  6 weeks    PT Treatment/Interventions  ADLs/Self Care Home Management;Aquatic Therapy;Cryotherapy;Electrical Stimulation;Moist Heat;DME Instruction;Gait  training;Stair training;Functional mobility training;Therapeutic activities;Therapeutic exercise;Balance training;Neuromuscular re-education;Patient/family education;Manual techniques;Scar mobilization;Passive range of motion;Taping    PT Next Visit Plan  Begin rocking bike next session for mobility.  Continue wiht exercises focusing on ROM. Manual interventions for edema and joint mobilization for mobility.     PT Home Exercise Plan  Eval: heel slide, quad set, assisted flexion stretch       Patient will benefit from skilled therapeutic intervention in order to improve the following deficits and impairments:  Abnormal gait, Decreased activity tolerance, Decreased endurance, Decreased range of motion, Decreased strength, Pain, Hypomobility, Decreased balance, Decreased mobility, Decreased scar mobility, Difficulty walking, Increased edema, Impaired flexibility  Visit Diagnosis: Acute pain of right knee  Stiffness of right knee, not elsewhere classified  Muscle weakness (generalized)  Other abnormalities of gait and mobility  Localized edema     Problem List Patient Active Problem List   Diagnosis Date Noted  . S/P total knee arthroplasty, left 11/23/2018  .  Glaucoma 11/23/2018  . Primary localized osteoarthritis of right knee 12/06/2017  . Primary localized osteoarthritis of left knee 11/24/2017  . Acute medial meniscus tear of left knee   . Hypertension    Ihor Austin, [place]; [place]  Aldona Lento 12/21/2018, 12:07 PM  [place] [address] [place], [place], [address] Phone: [phone]   Fax:  [phone]  Name: Nancy Steele MRN: [id number] Date of Birth: May 12, 1956

## 2018-12-23 ENCOUNTER — Encounter (HOSPITAL_COMMUNITY): Payer: Self-pay

## 2018-12-23 ENCOUNTER — Ambulatory Visit (HOSPITAL_COMMUNITY): Payer: 59

## 2018-12-23 DIAGNOSIS — R600 Localized edema: Secondary | ICD-10-CM

## 2018-12-23 DIAGNOSIS — R2689 Other abnormalities of gait and mobility: Secondary | ICD-10-CM

## 2018-12-23 DIAGNOSIS — M6281 Muscle weakness (generalized): Secondary | ICD-10-CM

## 2018-12-23 DIAGNOSIS — M25661 Stiffness of right knee, not elsewhere classified: Secondary | ICD-10-CM

## 2018-12-23 DIAGNOSIS — M25561 Pain in right knee: Secondary | ICD-10-CM | POA: Diagnosis not present

## 2018-12-23 NOTE — Therapy (Signed)
[place], [place], [address] Phone: [phone]   Fax:  [phone]  Physical Therapy Treatment  Patient Details  Name: Nancy Steele MRN: [id number] Date of Birth: [birth year] Referring Provider (PT):  Matthew Saras, [place]   Encounter Date: 12/23/2018  PT End of Session - 12/23/18 1042    Visit Number  3    Number of Visits  18    Date for PT Re-Evaluation  01/30/19   Minireassess 01/09/2019   Authorization Type  [employer]  (no auth requires, 60 visit limit)    Authorization Time Period  12/19/2018 - 01/30/2019    Authorization - Visit Number  3    Authorization - Number of Visits  60    PT Start Time  2263   3' on bike, not included wiht charges   PT Stop Time  1118    PT Time Calculation (min)  41 min    Activity Tolerance  Patient tolerated treatment well    Behavior During Therapy  [facility] for tasks assessed/performed       Past Medical History:  Diagnosis Date  . Arthritis   . DDD (degenerative disc disease), cervical   . Family history of adverse reaction to anesthesia    brother slow to wake up  . Glaucoma, left eye   . Hypertension   . OA (osteoarthritis)    knees  . Seasonal allergies   . Wears glasses     Past Surgical History:  Procedure Laterality Date  . CARPAL TUNNEL RELEASE Bilateral 2014  . CATARACT EXTRACTION W/ INTRAOCULAR LENS IMPLANT Left 2014  . CERVICAL FUSION  2000   C5 -- C6  . CHONDROPLASTY Left 10/17/2014   Procedure: CHONDROPLASTY;  Surgeon: Lorn Junes, MD;  Location: [place];  Service: Orthopedics;  Laterality: Left;  . COLONOSCOPY    . KNEE ARTHROSCOPY Bilateral left , prior to 2015;  right 2009 approx.  Marland Kitchen KNEE ARTHROSCOPY WITH LATERAL MENISECTOMY Left 10/17/2014   Procedure: KNEE ARTHROSCOPY WITH LATERAL MENISECTOMY;  Surgeon: Lorn Junes, MD;  Location: [place];  Service: Orthopedics;  Laterality: Left;  . KNEE  ARTHROSCOPY WITH MEDIAL MENISECTOMY Left 10/17/2014   Procedure: LEFT KNEE ARTHROSCOPY WITH MEDIAL MENISCECTOMY;  Surgeon: Lorn Junes, MD;  Location: [place];  Service: Orthopedics;  Laterality: Left;  . POLYPECTOMY    . TOTAL KNEE ARTHROPLASTY Left 12/06/2017   Procedure: LEFT TOTAL KNEE ARTHROPLASTY;  Surgeon: Elsie Saas, MD;  Location: [place];  Service: Orthopedics;  Laterality: Left;  . TOTAL KNEE ARTHROPLASTY Right 12/05/2018   Procedure: RIGHT TOTAL KNEE ARTHROPLASTY;  Surgeon: Elsie Saas, MD;  Location[facility];  Service: Orthopedics;  Laterality: Right;    There were no vitals filed for this visit.  Subjective Assessment - 12/23/18 1041    Subjective  Pt stated medication has helped with sleep.  Current pain scale 2/10 sore achey stiff today.    Currently in Pain?  Yes    Pain Score  2     Pain Location  Knee    Pain Orientation  Right    Pain Descriptors / Indicators  Aching;Sore;Tightness    Pain Type  Surgical pain;Acute pain    Pain Onset  1 to 4 weeks ago    Pain Frequency  Intermittent    Aggravating Factors   sitting still for too long    Pain Relieving Factors  ice, medication  Effect of Pain on Daily Activities  moderate                       [facility] Adult PT Treatment/Exercise - 12/23/18 0001      Knee/Hip Exercises: Stretches   Active Hamstring Stretch  3 reps;30 seconds    Active Hamstring Stretch Limitations  supine wiht rope    Knee: Self-Stretch to increase Flexion  5 reps;10 seconds    Knee: Self-Stretch Limitations  knee drive on 62UQ step 5x 10" holds    Gastroc Stretch  3 reps;30 seconds    Gastroc Stretch Limitations  slant board      Knee/Hip Exercises: Aerobic   Stationary Bike  seat 13 rocking forward; able to make full revolution backwards with cueing to reduce hip compensation      Knee/Hip Exercises: Standing   Terminal Knee Extension  10 reps;Theraband    Theraband Level (Terminal Knee Extension)   Level 2 (Red)    Terminal Knee Extension Limitations  5" holds    Rocker Board  2 minutes    Rocker Board Limitations  lateral and DF/PF      Knee/Hip Exercises: Supine   Quad Sets  15 reps    Short Arc [employer]  15 reps    Short Arc Quad Sets Limitations  3" holds    Heel Slides  10 reps    Knee Extension  AROM    Knee Extension Limitations  6   was 8   Knee Flexion  AROM    Knee Flexion Limitations  102   was 96     Manual Therapy   Manual Therapy  Edema management    Manual therapy comments  Manual therapy complete separate than rest of tx    Edema Management  Retrograde massage with LE elevated               PT Short Term Goals - 12/19/18 1112      PT SHORT TERM GOAL #1   Title  Patient will demonstrate understanding and report regular compliance with HEP.     Time  3    Period  Weeks    Status  New    Target Date  01/09/19      PT SHORT TERM GOAL #2   Title  Patient will demonstrate right knee AROM extension/flexion of at least 5-100 degrees in order to assist patient with more normalized gait and stair ambulation.     Time  3    Period  Weeks    Status  New      PT SHORT TERM GOAL #3   Title  Patient will demonstrate improvement of 1/2 MMT grade in all deficient tested musculature in order to assist patient in performing ambulation and stair ambulation with improved mechanics.     Time  3    Period  Weeks    Status  New        PT Long Term Goals - 12/19/18 1331      PT LONG TERM GOAL #1   Title  Patient will demonstrate right knee AROM extension/flexion of at least 0-120 degrees in order to assist patient with ambulation, stairs, and squatting.     Time  6    Period  Weeks    Status  New    Target Date  01/30/19      PT LONG TERM GOAL #2   Title  Patient will demonstrate improvement of 1  MMT grade or be 5/5 in all deficient tested musculature in order to assist patient in performing ambulation and stair ambulation with improved mechanics.      Time  6    Period  Weeks    Status  New      PT LONG TERM GOAL #3   Title  Patient will demonstrate ability to perform the 5 times sit to stand test in 10 seconds or less with equal weight bearing indicating improved balance and decreased risk of falls.    Time  6    Period  Weeks    Status  New      PT LONG TERM GOAL #4   Title  Patient will improve FOTO score to 39% or less limitation related to Rt knee impairments to indicate significant improvement in functional mobility.    Time  6    Period  Weeks    Status  New      PT LONG TERM GOAL #5   Title  Patient will demonstrate ability to perform ascending and descending 13 stairs with single handrail with reciprocal gait pattern for improved mechanics and navigation of stairs at patient's home.     Time  6    Period  Weeks    Status  New            Plan - 12/23/18 1058    Clinical Impression Statement  Added bike for mobility, pt able to make full revolution going backwards and rocking forward.  Added rocker board to improve knee mobilty and weight distribution.  EOS with manual retrograde massage for edema control, scar tissue adhesions to be addressed once incision fulled healed.  Improved AROM to 6-102 degrees (was 8-96 degrees last session.)    Examination-Activity Limitations  Stand;Stairs;Squat;Sit    Examination-Participation Restrictions  Community Activity;Driving    Stability/Clinical Decision Making  Stable/Uncomplicated    Rehab Potential  Good    PT Frequency  3x / week    PT Duration  6 weeks    PT Treatment/Interventions  ADLs/Self Care Home Management;Aquatic Therapy;Cryotherapy;Electrical Stimulation;Moist Heat;DME Instruction;Gait training;Stair training;Functional mobility training;Therapeutic activities;Therapeutic exercise;Balance training;Neuromuscular re-education;Patient/family education;Manual techniques;Scar mobilization;Passive range of motion;Taping    PT Next Visit Plan  Continue with exercises  focusing on ROM.  Manual interventions for edema and joint mobility.  Add scar tissue mobilization when incision fully healed.    PT Home Exercise Plan  Eval: heel slide, quad set, assisted flexion stretch       Patient will benefit from skilled therapeutic intervention in order to improve the following deficits and impairments:  Abnormal gait, Decreased activity tolerance, Decreased endurance, Decreased range of motion, Decreased strength, Pain, Hypomobility, Decreased balance, Decreased mobility, Decreased scar mobility, Difficulty walking, Increased edema, Impaired flexibility  Visit Diagnosis: Acute pain of right knee  Stiffness of right knee, not elsewhere classified  Muscle weakness (generalized)  Other abnormalities of gait and mobility  Localized edema     Problem List Patient Active Problem List   Diagnosis Date Noted  . S/P total knee arthroplasty, left 11/23/2018  . Glaucoma 11/23/2018  . Primary localized osteoarthritis of right knee 12/06/2017  . Primary localized osteoarthritis of left knee 11/24/2017  . Acute medial meniscus tear of left knee   . Hypertension    Ihor Austin, [place]; [place]  Aldona Lento 12/23/2018, 3:29 PM  [place] [address] [place], [place], [address] Phone: [phone]   Fax:  [phone]  Name:  Nancy Steele MRN: [id number] Date of Birth: Dec 15, 1955

## 2018-12-26 ENCOUNTER — Ambulatory Visit (HOSPITAL_COMMUNITY): Payer: 59

## 2018-12-26 ENCOUNTER — Encounter (HOSPITAL_COMMUNITY): Payer: Self-pay

## 2018-12-26 DIAGNOSIS — R2689 Other abnormalities of gait and mobility: Secondary | ICD-10-CM

## 2018-12-26 DIAGNOSIS — M6281 Muscle weakness (generalized): Secondary | ICD-10-CM

## 2018-12-26 DIAGNOSIS — M25561 Pain in right knee: Secondary | ICD-10-CM | POA: Diagnosis not present

## 2018-12-26 DIAGNOSIS — M25661 Stiffness of right knee, not elsewhere classified: Secondary | ICD-10-CM

## 2018-12-26 NOTE — Therapy (Signed)
[place], [place], [address] Phone: [phone]   Fax:  [phone]  Physical Therapy Treatment  Patient Details  Name: Nancy Steele MRN: [id number] Date of Birth: Oct 13, 1956 Referring Provider (PT):  Matthew Saras, [place]   Encounter Date: 12/26/2018  PT End of Session - 12/26/18 1043    Visit Number  4    Number of Visits  18    Date for PT Re-Evaluation  01/30/19   Minireassess 01/09/2019   Authorization Type  [employer]  (no auth requires, 60 visit limit)    Authorization Time Period  12/19/2018 - 01/30/2019    Authorization - Visit Number  4    Authorization - Number of Visits  60    PT Start Time  1034    PT Stop Time  1115    PT Time Calculation (min)  41 min    Activity Tolerance  Patient tolerated treatment well    Behavior During Therapy  [facility] for tasks assessed/performed       Past Medical History:  Diagnosis Date  . Arthritis   . DDD (degenerative disc disease), cervical   . Family history of adverse reaction to anesthesia    brother slow to wake up  . Glaucoma, left eye   . Hypertension   . OA (osteoarthritis)    knees  . Seasonal allergies   . Wears glasses     Past Surgical History:  Procedure Laterality Date  . CARPAL TUNNEL RELEASE Bilateral 2014  . CATARACT EXTRACTION W/ INTRAOCULAR LENS IMPLANT Left 2014  . CERVICAL FUSION  2000   C5 -- C6  . CHONDROPLASTY Left 10/17/2014   Procedure: CHONDROPLASTY;  Surgeon: Lorn Junes, MD;  Location: [place];  Service: Orthopedics;  Laterality: Left;  . COLONOSCOPY    . KNEE ARTHROSCOPY Bilateral left , prior to 2015;  right 2009 approx.  Marland Kitchen KNEE ARTHROSCOPY WITH LATERAL MENISECTOMY Left 10/17/2014   Procedure: KNEE ARTHROSCOPY WITH LATERAL MENISECTOMY;  Surgeon: Lorn Junes, MD;  Location: [place];  Service: Orthopedics;  Laterality: Left;  . KNEE ARTHROSCOPY WITH MEDIAL MENISECTOMY Left  10/17/2014   Procedure: LEFT KNEE ARTHROSCOPY WITH MEDIAL MENISCECTOMY;  Surgeon: Lorn Junes, MD;  Location: [place];  Service: Orthopedics;  Laterality: Left;  . POLYPECTOMY    . TOTAL KNEE ARTHROPLASTY Left 12/06/2017   Procedure: LEFT TOTAL KNEE ARTHROPLASTY;  Surgeon: Elsie Saas, MD;  Location: [place];  Service: Orthopedics;  Laterality: Left;  . TOTAL KNEE ARTHROPLASTY Right 12/05/2018   Procedure: RIGHT TOTAL KNEE ARTHROPLASTY;  Surgeon: Elsie Saas, MD;  Location: [facility];  Service: Orthopedics;  Laterality: Right;    There were no vitals filed for this visit.  Subjective Assessment - 12/26/18 1040    Subjective  Patient reports she is feeling good today. Her knee is stiff and aching but she reports that is her only problem. She reports she walked this weekend with minimal difficulty. She did take her medicine before therpay.     Limitations  House hold activities;Standing;Walking;Sitting    Currently in Pain?  Yes    Pain Score  2     Pain Location  Knee    Pain Orientation  Right    Pain Descriptors / Indicators  Aching;Sore    Pain Type  Surgical pain    Pain Onset  1 to 4 weeks ago        [facility] Adult  PT Treatment/Exercise - 12/26/18 0001      Knee/Hip Exercises: Stretches   Active Hamstring Stretch  Right;3 reps;30 seconds    Active Hamstring Stretch Limitations  12" box    Knee: Self-Stretch to increase Flexion  Right;3 reps;30 seconds    Knee: Self-Stretch Limitations  12" box    Gastroc Stretch  3 reps;30 seconds    Gastroc Stretch Limitations  slant board      Knee/Hip Exercises: Standing   Heel Raises  Both;1 set;20 reps;3 seconds   on incline   Heel Raises Limitations  Toe raises: 1x 20 reps on decline, 3 sec holds    Knee Flexion  Right;2 sets;10 reps    Forward Lunges  Both;1 set;10 reps    Terminal Knee Extension  Right;2 sets;10 reps    Theraband Level (Terminal Knee Extension)  Level 4 (Blue)    Terminal Knee Extension  Limitations  5" holds    Rocker Board  2 minutes    Rocker Board Limitations  lateral for AROM      Knee/Hip Exercises: Supine   Knee Extension  AROM    Knee Extension Limitations  4    Knee Flexion  AROM    Knee Flexion Limitations  97      Manual Therapy   Manual Therapy  Edema management;Myofascial release    Manual therapy comments  Manual therapy complete separate than rest of tx    Edema Management  Retrograde massage with LE elevated    Myofascial Release  cross friction scar tissue mobilization        PT Education - 12/26/18 1041    Education Details  Educated on current ROM and purpose of interventions throughout session.    Person(s) Educated  Patient    Methods  Explanation    Comprehension  Verbalized understanding       PT Short Term Goals - 12/19/18 1112      PT SHORT TERM GOAL #1   Title  Patient will demonstrate understanding and report regular compliance with HEP.     Time  3    Period  Weeks    Status  New    Target Date  01/09/19      PT SHORT TERM GOAL #2   Title  Patient will demonstrate right knee AROM extension/flexion of at least 5-100 degrees in order to assist patient with more normalized gait and stair ambulation.     Time  3    Period  Weeks    Status  New      PT SHORT TERM GOAL #3   Title  Patient will demonstrate improvement of 1/2 MMT grade in all deficient tested musculature in order to assist patient in performing ambulation and stair ambulation with improved mechanics.     Time  3    Period  Weeks    Status  New        PT Long Term Goals - 12/19/18 1331      PT LONG TERM GOAL #1   Title  Patient will demonstrate right knee AROM extension/flexion of at least 0-120 degrees in order to assist patient with ambulation, stairs, and squatting.     Time  6    Period  Weeks    Status  New    Target Date  01/30/19      PT LONG TERM GOAL #2   Title  Patient will demonstrate improvement of 1 MMT grade or be 5/5 in all deficient  tested  musculature in order to assist patient in performing ambulation and stair ambulation with improved mechanics.     Time  6    Period  Weeks    Status  New      PT LONG TERM GOAL #3   Title  Patient will demonstrate ability to perform the 5 times sit to stand test in 10 seconds or less with equal weight bearing indicating improved balance and decreased risk of falls.    Time  6    Period  Weeks    Status  New      PT LONG TERM GOAL #4   Title  Patient will improve FOTO score to 39% or less limitation related to Rt knee impairments to indicate significant improvement in functional mobility.    Time  6    Period  Weeks    Status  New      PT LONG TERM GOAL #5   Title  Patient will demonstrate ability to perform ascending and descending 13 stairs with single handrail with reciprocal gait pattern for improved mechanics and navigation of stairs at patient's home.     Time  6    Period  Weeks    Status  New       Plan - 12/26/18 1042    Clinical Impression Statement  Continued with focus on ROM for therapy and exercises targeting knee mobility. Introduce forward lunge for knee flexion challenge and progressed TKE with greater resistance for knee extension on Rt. Patient ROM limited from 4-97 today. Manual interventions continued to address ongoign edema and patient educate on scar mobilization to address adhesions. She will continue to benefit from skilled PT interventions to addres impairments and progress towards goals.    Examination-Activity Limitations  Stand;Stairs;Squat;Sit    Examination-Participation Restrictions  Community Activity;Driving    Stability/Clinical Decision Making  Stable/Uncomplicated    Rehab Potential  Good    PT Frequency  3x / week    PT Duration  6 weeks    PT Treatment/Interventions  ADLs/Self Care Home Management;Aquatic Therapy;Cryotherapy;Electrical Stimulation;Moist Heat;DME Instruction;Gait training;Stair training;Functional mobility  training;Therapeutic activities;Therapeutic exercise;Balance training;Neuromuscular re-education;Patient/family education;Manual techniques;Scar mobilization;Passive range of motion;Taping    PT Next Visit Plan  Continue with exercises focusing on ROM.  Manual interventions for edema and joint mobility and scar mobilization.    PT Home Exercise Plan  Eval: heel slide, quad set, assisted flexion stretch    Consulted and Agree with Plan of Care  Patient       Patient will benefit from skilled therapeutic intervention in order to improve the following deficits and impairments:  Abnormal gait, Decreased activity tolerance, Decreased endurance, Decreased range of motion, Decreased strength, Pain, Hypomobility, Decreased balance, Decreased mobility, Decreased scar mobility, Difficulty walking, Increased edema, Impaired flexibility  Visit Diagnosis: Acute pain of right knee  Stiffness of right knee, not elsewhere classified  Muscle weakness (generalized)  Other abnormalities of gait and mobility     Problem List Patient Active Problem List   Diagnosis Date Noted  . S/P total knee arthroplasty, left 11/23/2018  . Glaucoma 11/23/2018  . Primary localized osteoarthritis of right knee 12/06/2017  . Primary localized osteoarthritis of left knee 11/24/2017  . Acute medial meniscus tear of left knee   . Hypertension     Kipp Brood, PT, DPT, [facility] Physical Therapist with [facility]  12/26/2018 12:32 PM    [facility] [address] [place], [place], [address] Phone:  [phone]   Fax:  [phone]  Name: LAQUANTA HUMMEL MRN: [id number] Date of Birth: 28-Jan-1956

## 2018-12-28 ENCOUNTER — Ambulatory Visit (HOSPITAL_COMMUNITY): Payer: 59 | Admitting: Physical Therapy

## 2018-12-28 ENCOUNTER — Other Ambulatory Visit: Payer: Self-pay

## 2018-12-28 DIAGNOSIS — M25561 Pain in right knee: Secondary | ICD-10-CM | POA: Diagnosis not present

## 2018-12-28 DIAGNOSIS — M25661 Stiffness of right knee, not elsewhere classified: Secondary | ICD-10-CM

## 2018-12-28 DIAGNOSIS — M6281 Muscle weakness (generalized): Secondary | ICD-10-CM

## 2018-12-28 NOTE — Therapy (Signed)
[place], [place], [address] Phone: [phone]   Fax:  [phone]  Physical Therapy Treatment  Patient Details  Name: Nancy Steele MRN: [id number] Date of Birth: 10/19/1956 Referring Provider (PT):  Matthew Saras, [place]   Encounter Date: 12/28/2018  PT End of Session - 12/28/18 1601    Visit Number  5    Number of Visits  18    Date for PT Re-Evaluation  01/30/19   Minireassess 01/09/2019   Authorization Type  [employer]  (no auth requires, 60 visit limit)    Authorization Time Period  12/19/2018 - 01/30/2019    Authorization - Visit Number  5    Authorization - Number of Visits  60    PT Start Time  1346    PT Stop Time  1429    PT Time Calculation (min)  43 min    Activity Tolerance  Patient tolerated treatment well    Behavior During Therapy  [facility] for tasks assessed/performed       Past Medical History:  Diagnosis Date  . Arthritis   . DDD (degenerative disc disease), cervical   . Family history of adverse reaction to anesthesia    brother slow to wake up  . Glaucoma, left eye   . Hypertension   . OA (osteoarthritis)    knees  . Seasonal allergies   . Wears glasses     Past Surgical History:  Procedure Laterality Date  . CARPAL TUNNEL RELEASE Bilateral 2014  . CATARACT EXTRACTION W/ INTRAOCULAR LENS IMPLANT Left 2014  . CERVICAL FUSION  2000   C5 -- C6  . CHONDROPLASTY Left 10/17/2014   Procedure: CHONDROPLASTY;  Surgeon: Lorn Junes, MD;  Location: [place];  Service: Orthopedics;  Laterality: Left;  . COLONOSCOPY    . KNEE ARTHROSCOPY Bilateral left , prior to 2015;  right 2009 approx.  Marland Kitchen KNEE ARTHROSCOPY WITH LATERAL MENISECTOMY Left 10/17/2014   Procedure: KNEE ARTHROSCOPY WITH LATERAL MENISECTOMY;  Surgeon: Lorn Junes, MD;  Location: [place];  Service: Orthopedics;  Laterality: Left;  . KNEE ARTHROSCOPY WITH MEDIAL MENISECTOMY  Left 10/17/2014   Procedure: LEFT KNEE ARTHROSCOPY WITH MEDIAL MENISCECTOMY;  Surgeon: Lorn Junes, MD;  Location: [place];  Service: Orthopedics;  Laterality: Left;  . POLYPECTOMY    . TOTAL KNEE ARTHROPLASTY Left 12/06/2017   Procedure: LEFT TOTAL KNEE ARTHROPLASTY;  Surgeon: Elsie Saas, MD;  Location: [place];  Service: Orthopedics;  Laterality: Left;  . TOTAL KNEE ARTHROPLASTY Right 12/05/2018   Procedure: RIGHT TOTAL KNEE ARTHROPLASTY;  Surgeon: Elsie Saas, MD;  Location: [facility];  Service: Orthopedics;  Laterality: Right;    There were no vitals filed for this visit.  Subjective Assessment - 12/28/18 1613    Subjective  pt states she is feeling about the same today.  Knee conitnues to be stiff despite all the self manual she is doing.  States pain is 3/10 following pain meds.    Currently in Pain?  Yes    Pain Score  3     Pain Location  Knee    Pain Orientation  Right    Pain Descriptors / Indicators  Aching                       [facility] Adult PT Treatment/Exercise - 12/28/18 0001      Knee/Hip Exercises: Stretches   Active Hamstring Stretch  Right;3 reps;30 seconds    Active Hamstring Stretch Limitations  12" box    Knee: Self-Stretch to increase Flexion  Right;3 reps;30 seconds    Knee: Self-Stretch Limitations  12" box    Gastroc Stretch  3 reps;30 seconds    Gastroc Stretch Limitations  slant board      Knee/Hip Exercises: Standing   Heel Raises  Both;1 set;20 reps;3 seconds    Heel Raises Limitations  Toe raises: 1x 20 reps on decline, 3 sec holds    Knee Flexion  Right;2 sets;10 reps    Forward Lunges  Both;1 set;10 reps    Terminal Knee Extension  Right;2 sets;10 reps    Theraband Level (Terminal Knee Extension)  Level 4 (Blue)    Terminal Knee Extension Limitations  5" holds      Knee/Hip Exercises: Supine   Quad Sets  15 reps    Knee Extension  AROM    Knee Extension Limitations  4    Knee Flexion  AROM    Knee Flexion  Limitations  105      Manual Therapy   Manual Therapy  Edema management;Myofascial release    Manual therapy comments  Manual therapy complete separate than rest of tx    Edema Management  Retrograde massage with LE elevated    Myofascial Release  cross friction scar tissue mobilization               PT Short Term Goals - 12/19/18 1112      PT SHORT TERM GOAL #1   Title  Patient will demonstrate understanding and report regular compliance with HEP.     Time  3    Period  Weeks    Status  New    Target Date  01/09/19      PT SHORT TERM GOAL #2   Title  Patient will demonstrate right knee AROM extension/flexion of at least 5-100 degrees in order to assist patient with more normalized gait and stair ambulation.     Time  3    Period  Weeks    Status  New      PT SHORT TERM GOAL #3   Title  Patient will demonstrate improvement of 1/2 MMT grade in all deficient tested musculature in order to assist patient in performing ambulation and stair ambulation with improved mechanics.     Time  3    Period  Weeks    Status  New        PT Long Term Goals - 12/19/18 1331      PT LONG TERM GOAL #1   Title  Patient will demonstrate right knee AROM extension/flexion of at least 0-120 degrees in order to assist patient with ambulation, stairs, and squatting.     Time  6    Period  Weeks    Status  New    Target Date  01/30/19      PT LONG TERM GOAL #2   Title  Patient will demonstrate improvement of 1 MMT grade or be 5/5 in all deficient tested musculature in order to assist patient in performing ambulation and stair ambulation with improved mechanics.     Time  6    Period  Weeks    Status  New      PT LONG TERM GOAL #3   Title  Patient will demonstrate ability to perform the 5 times sit to stand test in 10 seconds or less with equal weight bearing indicating improved balance  and decreased risk of falls.    Time  6    Period  Weeks    Status  New      PT LONG TERM GOAL #4    Title  Patient will improve FOTO score to 39% or less limitation related to Rt knee impairments to indicate significant improvement in functional mobility.    Time  6    Period  Weeks    Status  New      PT LONG TERM GOAL #5   Title  Patient will demonstrate ability to perform ascending and descending 13 stairs with single handrail with reciprocal gait pattern for improved mechanics and navigation of stairs at patient's home.     Time  6    Period  Weeks    Status  New            Plan - 12/28/18 1609    Clinical Impression Statement  Began session with stretches to improve Lt knee flexion/extension.  Completed manual to Lt knee including retro and myofascial technques to reduce scar tissue and improve overall mobility.  Alb eo loosen all tisuse along scar line and achieve additional gains in flexion to 105 degrees at end of session.  Pt with some sensitiviites in areas, especially lateral patellar region.      Examination-Activity Limitations  Stand;Stairs;Squat;Sit    Examination-Participation Restrictions  Community Activity;Driving    Stability/Clinical Decision Making  Stable/Uncomplicated    Rehab Potential  Good    PT Frequency  3x / week    PT Duration  6 weeks    PT Treatment/Interventions  ADLs/Self Care Home Management;Aquatic Therapy;Cryotherapy;Electrical Stimulation;Moist Heat;DME Instruction;Gait training;Stair training;Functional mobility training;Therapeutic activities;Therapeutic exercise;Balance training;Neuromuscular re-education;Patient/family education;Manual techniques;Scar mobilization;Passive range of motion;Taping    PT Next Visit Plan  Continue with exercises focusing on ROM.  Manual interventions for edema and joint mobility and scar mobilization.    PT Home Exercise Plan  Eval: heel slide, quad set, assisted flexion stretch    Consulted and Agree with Plan of Care  Patient       Patient will benefit from skilled therapeutic intervention in order to  improve the following deficits and impairments:  Abnormal gait, Decreased activity tolerance, Decreased endurance, Decreased range of motion, Decreased strength, Pain, Hypomobility, Decreased balance, Decreased mobility, Decreased scar mobility, Difficulty walking, Increased edema, Impaired flexibility  Visit Diagnosis: Acute pain of right knee  Stiffness of right knee, not elsewhere classified  Muscle weakness (generalized)     Problem List Patient Active Problem List   Diagnosis Date Noted  . S/P total knee arthroplasty, left 11/23/2018  . Glaucoma 11/23/2018  . Primary localized osteoarthritis of right knee 12/06/2017  . Primary localized osteoarthritis of left knee 11/24/2017  . Acute medial meniscus tear of left knee   . Hypertension    Teena Irani, PTA/CLT [phone]  Teena Irani 12/28/2018, 4:15 PM  [facility] [address] [place], [place], [address] Phone: [phone]   Fax:  [phone]  Name: Nancy Steele MRN: [id number] Date of Birth: May 13, 1956

## 2018-12-29 DIAGNOSIS — Z6832 Body mass index (BMI) 32.0-32.9, adult: Secondary | ICD-10-CM | POA: Diagnosis not present

## 2018-12-29 DIAGNOSIS — Z713 Dietary counseling and surveillance: Secondary | ICD-10-CM | POA: Diagnosis not present

## 2018-12-30 ENCOUNTER — Other Ambulatory Visit: Payer: Self-pay

## 2018-12-30 ENCOUNTER — Ambulatory Visit (HOSPITAL_COMMUNITY): Payer: 59

## 2018-12-30 ENCOUNTER — Encounter (HOSPITAL_COMMUNITY): Payer: Self-pay

## 2018-12-30 DIAGNOSIS — M25561 Pain in right knee: Secondary | ICD-10-CM

## 2018-12-30 DIAGNOSIS — R600 Localized edema: Secondary | ICD-10-CM

## 2018-12-30 DIAGNOSIS — M25661 Stiffness of right knee, not elsewhere classified: Secondary | ICD-10-CM

## 2018-12-30 DIAGNOSIS — M6281 Muscle weakness (generalized): Secondary | ICD-10-CM

## 2018-12-30 DIAGNOSIS — R2689 Other abnormalities of gait and mobility: Secondary | ICD-10-CM

## 2018-12-30 NOTE — Therapy (Signed)
[facility] [place], [place], [address] Phone: [phone]   Fax:  [phone]  Physical Therapy Treatment  Patient Details  Name: Nancy Steele MRN: [id number] Date of Birth: March 19, 1956 Referring Provider (PT):  Matthew Saras, [place]   Encounter Date: 12/30/2018  PT End of Session - 12/30/18 1042    Visit Number  6    Number of Visits  18    Date for PT Re-Evaluation  01/30/19   Minireassess 01/09/19   Authorization Type  [employer]  (no auth requires, 60 visit limit)    Authorization Time Period  12/19/2018 - 01/30/2019    Authorization - Visit Number  6    Authorization - Number of Visits  60    PT Start Time  2774   pt late for apt.  3' on bike, not included wiht charges   PT Stop Time  1120    PT Time Calculation (min)  42 min    Activity Tolerance  Patient tolerated treatment well    Behavior During Therapy  WFL for tasks assessed/performed       Past Medical History:  Diagnosis Date  . Arthritis   . DDD (degenerative disc disease), cervical   . Family history of adverse reaction to anesthesia    brother slow to wake up  . Glaucoma, left eye   . Hypertension   . OA (osteoarthritis)    knees  . Seasonal allergies   . Wears glasses     Past Surgical History:  Procedure Laterality Date  . CARPAL TUNNEL RELEASE Bilateral 2014  . CATARACT EXTRACTION W/ INTRAOCULAR LENS IMPLANT Left 2014  . CERVICAL FUSION  2000   C5 -- C6  . CHONDROPLASTY Left 10/17/2014   Procedure: CHONDROPLASTY;  Surgeon: Lorn Junes, MD;  Location: [facility];  Service: Orthopedics;  Laterality: Left;  . COLONOSCOPY    . KNEE ARTHROSCOPY Bilateral left , prior to 2015;  right 2009 approx.  Marland Kitchen KNEE ARTHROSCOPY WITH LATERAL MENISECTOMY Left 10/17/2014   Procedure: KNEE ARTHROSCOPY WITH LATERAL MENISECTOMY;  Surgeon: Lorn Junes, MD;  Location: [facility];  Service: Orthopedics;   Laterality: Left;  . KNEE ARTHROSCOPY WITH MEDIAL MENISECTOMY Left 10/17/2014   Procedure: LEFT KNEE ARTHROSCOPY WITH MEDIAL MENISCECTOMY;  Surgeon: Lorn Junes, MD;  Location: [facility];  Service: Orthopedics;  Laterality: Left;  . POLYPECTOMY    . TOTAL KNEE ARTHROPLASTY Left 12/06/2017   Procedure: LEFT TOTAL KNEE ARTHROPLASTY;  Surgeon: Elsie Saas, MD;  Location: [facility];  Service: Orthopedics;  Laterality: Left;  . TOTAL KNEE ARTHROPLASTY Right 12/05/2018   Procedure: RIGHT TOTAL KNEE ARTHROPLASTY;  Surgeon: Elsie Saas, MD;  Location: [facility];  Service: Orthopedics;  Laterality: Right;    There were no vitals filed for this visit.  Subjective Assessment - 12/30/18 1038    Subjective  Pt stated knee is stiff today, no reports of pain today.      Currently in Pain?  No/denies    Pain Descriptors / Indicators  Tightness                       [facility] Adult PT Treatment/Exercise - 12/30/18 0001      Knee/Hip Exercises: Stretches   Active Hamstring Stretch  Right;3 reps;30 seconds    Active Hamstring Stretch Limitations  12" box    Quad Stretch  2 reps;30 seconds    Sports administrator  Limitations  prone wiht rope    Knee: Self-Stretch to increase Flexion  Right;3 reps;30 seconds    Knee: Self-Stretch Limitations  12" box    Gastroc Stretch  3 reps;30 seconds    Gastroc Stretch Limitations  slant board      Knee/Hip Exercises: Aerobic   Stationary Bike  seat 14 full revolution first backwards then forward, min cueing to reduce hip compensation      Knee/Hip Exercises: Standing   Heel Raises  Both;1 set;20 reps;3 seconds    Heel Raises Limitations  Toe raises: 1x 20 reps on decline, 3 sec holds    Terminal Knee Extension  15 reps;Theraband    Theraband Level (Terminal Knee Extension)  Level 4 (Blue)    Terminal Knee Extension Limitations  5" holds      Knee/Hip Exercises: Supine   Short Arc [employer]  15 reps    Short Arc Quad Sets Limitations  3"  holds    Knee Extension  AROM    Knee Extension Limitations  4   was 4   Knee Flexion  AROM    Knee Flexion Limitations  109   was 105     Manual Therapy   Manual Therapy  Edema management;Myofascial release    Manual therapy comments  Manual therapy complete separate than rest of tx    Edema Management  Retrograde massage with LE elevated    Myofascial Release  cross friction scar tissue mobilization               PT Short Term Goals - 12/19/18 1112      PT SHORT TERM GOAL #1   Title  Patient will demonstrate understanding and report regular compliance with HEP.     Time  3    Period  Weeks    Status  New    Target Date  01/09/19      PT SHORT TERM GOAL #2   Title  Patient will demonstrate right knee AROM extension/flexion of at least 5-100 degrees in order to assist patient with more normalized gait and stair ambulation.     Time  3    Period  Weeks    Status  New      PT SHORT TERM GOAL #3   Title  Patient will demonstrate improvement of 1/2 MMT grade in all deficient tested musculature in order to assist patient in performing ambulation and stair ambulation with improved mechanics.     Time  3    Period  Weeks    Status  New        PT Long Term Goals - 12/19/18 1331      PT LONG TERM GOAL #1   Title  Patient will demonstrate right knee AROM extension/flexion of at least 0-120 degrees in order to assist patient with ambulation, stairs, and squatting.     Time  6    Period  Weeks    Status  New    Target Date  01/30/19      PT LONG TERM GOAL #2   Title  Patient will demonstrate improvement of 1 MMT grade or be 5/5 in all deficient tested musculature in order to assist patient in performing ambulation and stair ambulation with improved mechanics.     Time  6    Period  Weeks    Status  New      PT LONG TERM GOAL #3   Title  Patient will demonstrate ability to perform  the 5 times sit to stand test in 10 seconds or less with equal weight bearing  indicating improved balance and decreased risk of falls.    Time  6    Period  Weeks    Status  New      PT LONG TERM GOAL #4   Title  Patient will improve FOTO score to 39% or less limitation related to Rt knee impairments to indicate significant improvement in functional mobility.    Time  6    Period  Weeks    Status  New      PT LONG TERM GOAL #5   Title  Patient will demonstrate ability to perform ascending and descending 13 stairs with single handrail with reciprocal gait pattern for improved mechanics and navigation of stairs at patient's home.     Time  6    Period  Weeks    Status  New            Plan - 12/30/18 1220    Clinical Impression Statement  Pt progressing well with knee mobility, improved AROM 4-109 degrees this session following manual.  Continued manual technique to address scar tissue adhesions, joint mobs for mobility and retrograde massage.  Noted stitch at proximal insicion, not removed today.  No reports of increased pain through session.  Added standing TKE to HEP for quad strengthening to improve extension at home.      Examination-Activity Limitations  Stand;Stairs;Squat;Sit    Examination-Participation Restrictions  Community Activity;Driving    Stability/Clinical Decision Making  Stable/Uncomplicated    Rehab Potential  Good    PT Frequency  3x / week    PT Duration  6 weeks    PT Treatment/Interventions  ADLs/Self Care Home Management;Aquatic Therapy;Cryotherapy;Electrical Stimulation;Moist Heat;DME Instruction;Gait training;Stair training;Functional mobility training;Therapeutic activities;Therapeutic exercise;Balance training;Neuromuscular re-education;Patient/family education;Manual techniques;Scar mobilization;Passive range of motion;Taping    PT Next Visit Plan  Continue with exercises focusing on ROM.  Manual interventions for edema and joint mobility and scar mobilization.    PT Home Exercise Plan  Eval: heel slide, quad set, assisted flexion  stretch; 3/13: standing TKE       Patient will benefit from skilled therapeutic intervention in order to improve the following deficits and impairments:  Abnormal gait, Decreased activity tolerance, Decreased endurance, Decreased range of motion, Decreased strength, Pain, Hypomobility, Decreased balance, Decreased mobility, Decreased scar mobility, Difficulty walking, Increased edema, Impaired flexibility  Visit Diagnosis: Acute pain of right knee  Stiffness of right knee, not elsewhere classified  Muscle weakness (generalized)  Other abnormalities of gait and mobility  Localized edema     Problem List Patient Active Problem List   Diagnosis Date Noted  . S/P total knee arthroplasty, left 11/23/2018  . Glaucoma 11/23/2018  . Primary localized osteoarthritis of right knee 12/06/2017  . Primary localized osteoarthritis of left knee 11/24/2017  . Acute medial meniscus tear of left knee   . Hypertension    Ihor Austin, LPTA; [place]  Aldona Lento 12/30/2018, 1:17 PM  [facility] [address] [place], [place], [address] Phone: [phone]   Fax:  [phone]  Name: Nancy Steele MRN: [id number] Date of Birth: 01-20-1956

## 2018-12-30 NOTE — Patient Instructions (Signed)
Knee Extension: Terminal - Standing (Single Leg)    Face anchor in shoulder width stance, band around knee. Allow tension of band to slightly bend knee. Pull leg back, straightening knee. Repeat __ times per set. Repeat with other leg. Do 15 sets per session. Do 1-2 sessions per week. Anchor Height: Knee  [email or web address].[email or web address]   Copyright  VHI. All rights reserved.

## 2019-01-02 ENCOUNTER — Other Ambulatory Visit: Payer: Self-pay

## 2019-01-02 ENCOUNTER — Encounter (HOSPITAL_COMMUNITY): Payer: Self-pay

## 2019-01-02 ENCOUNTER — Ambulatory Visit (HOSPITAL_COMMUNITY): Payer: 59

## 2019-01-02 DIAGNOSIS — M6281 Muscle weakness (generalized): Secondary | ICD-10-CM

## 2019-01-02 DIAGNOSIS — M25561 Pain in right knee: Secondary | ICD-10-CM

## 2019-01-02 DIAGNOSIS — M25661 Stiffness of right knee, not elsewhere classified: Secondary | ICD-10-CM

## 2019-01-02 DIAGNOSIS — R600 Localized edema: Secondary | ICD-10-CM

## 2019-01-02 DIAGNOSIS — R2689 Other abnormalities of gait and mobility: Secondary | ICD-10-CM

## 2019-01-02 NOTE — Therapy (Signed)
[place], [place], [address] Phone: [phone]   Fax:  [phone]  Physical Therapy Treatment  Patient Details  Name: Nancy Steele MRN: [id number] Date of Birth: [birth year] Referring Provider (PT):  Matthew Saras, [place]   Encounter Date: 01/02/2019  PT End of Session - 01/02/19 1040    Visit Number  7    Number of Visits  18    Date for PT Re-Evaluation  01/30/19   Minireassess 01/09/19   Authorization Type  [employer]  (no auth requires, 60 visit limit)    Authorization Time Period  12/19/2018 - 01/30/2019    Authorization - Visit Number  7    Authorization - Number of Visits  60    PT Start Time  9528    PT Stop Time  1117    PT Time Calculation (min)  42 min    Activity Tolerance  Patient tolerated treatment well    Behavior During Therapy  [facility] for tasks assessed/performed       Past Medical History:  Diagnosis Date  . Arthritis   . DDD (degenerative disc disease), cervical   . Family history of adverse reaction to anesthesia    brother slow to wake up  . Glaucoma, left eye   . Hypertension   . OA (osteoarthritis)    knees  . Seasonal allergies   . Wears glasses     Past Surgical History:  Procedure Laterality Date  . CARPAL TUNNEL RELEASE Bilateral 2014  . CATARACT EXTRACTION W/ INTRAOCULAR LENS IMPLANT Left 2014  . CERVICAL FUSION  2000   C5 -- C6  . CHONDROPLASTY Left 10/17/2014   Procedure: CHONDROPLASTY;  Surgeon: Lorn Junes, MD;  Location: [facility];  Service: Orthopedics;  Laterality: Left;  . COLONOSCOPY    . KNEE ARTHROSCOPY Bilateral left , prior to 2015;  right 2009 approx.  Marland Kitchen KNEE ARTHROSCOPY WITH LATERAL MENISECTOMY Left 10/17/2014   Procedure: KNEE ARTHROSCOPY WITH LATERAL MENISECTOMY;  Surgeon: Lorn Junes, MD;  Location: [facility];  Service: Orthopedics;  Laterality: Left;  . KNEE ARTHROSCOPY WITH MEDIAL MENISECTOMY Left  10/17/2014   Procedure: LEFT KNEE ARTHROSCOPY WITH MEDIAL MENISCECTOMY;  Surgeon: Lorn Junes, MD;  Location: [facility];  Service: Orthopedics;  Laterality: Left;  . POLYPECTOMY    . TOTAL KNEE ARTHROPLASTY Left 12/06/2017   Procedure: LEFT TOTAL KNEE ARTHROPLASTY;  Surgeon: Elsie Saas, MD;  Location: [facility];  Service: Orthopedics;  Laterality: Left;  . TOTAL KNEE ARTHROPLASTY Right 12/05/2018   Procedure: RIGHT TOTAL KNEE ARTHROPLASTY;  Surgeon: Elsie Saas, MD;  Location: [facility];  Service: Orthopedics;  Laterality: Right;    There were no vitals filed for this visit.  Subjective Assessment - 01/02/19 1037    Subjective  Patient reports she walked around her yard and neighborhood this past Saturday because it was nice out. She reports she was did feel more achey and sore Sunday after but it is not as sore anymore. She denies pain currently and reports she is taking medicine before therapy but not at other times. She reports it is overall achey like a tootache and she doesn't feel her Lt knee was like that.     Limitations  House hold activities;Standing;Walking;Sitting    Currently in Pain?  No/denies       [facility] Adult PT Treatment/Exercise - 01/02/19 0001      Knee/Hip Exercises: Stretches   Active  Hamstring Stretch  Right;3 reps;30 seconds    Active Hamstring Stretch Limitations  12" box    Knee: Self-Stretch to increase Flexion  Right;3 reps;30 seconds    Knee: Self-Stretch Limitations  12" box    Gastroc Stretch  3 reps;30 seconds    Gastroc Stretch Limitations  slant board      Knee/Hip Exercises: Aerobic   Stationary Bike  4 minutes, seat 13 full revolution min cueing to reduce hip compensation      Knee/Hip Exercises: Standing   Terminal Knee Extension  Right;2 sets;15 reps    Theraband Level (Terminal Knee Extension)  Level 4 (Blue)    Terminal Knee Extension Limitations  5" holds      Knee/Hip Exercises: Supine   Short Arc Quad Sets  Right;1  set;15 reps    Short Arc Quad Sets Limitations  5" holds    Heel Slides  Right;1 set;15 reps      Knee/Hip Exercises: Prone   Contract/Relax to Increase Flexion  Rt LE; 3x 5 sec contract/30 sec relax, manual overpressure from therapist      Manual Therapy   Manual Therapy  Edema management;Myofascial release;Joint mobilization    Manual therapy comments  Manual therapy complete separate than rest of tx    Edema Management  Retrograde massage with LE elevated    Joint Mobilization  3x 30 seconds patellofemoral joint mobilization (sup/inf and med/lat)    Myofascial Release  cross friction scar tissue mobilization        PT Education - 01/02/19 1039    Education Details  Edcuated on self mobilization for patellofemoral joint on Rt knee to reduce stiffness.    Person(s) Educated  Patient    Methods  Explanation    Comprehension  Verbalized understanding       PT Short Term Goals - 01/02/19 1040      PT SHORT TERM GOAL #1   Title  Patient will demonstrate understanding and report regular compliance with HEP.     Time  3    Period  Weeks    Status  Achieved    Target Date  01/09/19      PT SHORT TERM GOAL #2   Title  Patient will demonstrate right knee AROM extension/flexion of at least 5-100 degrees in order to assist patient with more normalized gait and stair ambulation.     Time  3    Period  Weeks    Status  On-going      PT SHORT TERM GOAL #3   Title  Patient will demonstrate improvement of 1/2 MMT grade in all deficient tested musculature in order to assist patient in performing ambulation and stair ambulation with improved mechanics.     Time  3    Period  Weeks    Status  On-going        PT Long Term Goals - 01/02/19 1040      PT LONG TERM GOAL #1   Title  Patient will demonstrate right knee AROM extension/flexion of at least 0-120 degrees in order to assist patient with ambulation, stairs, and squatting.     Time  6    Period  Weeks    Status  On-going       PT LONG TERM GOAL #2   Title  Patient will demonstrate improvement of 1 MMT grade or be 5/5 in all deficient tested musculature in order to assist patient in performing ambulation and stair ambulation with improved mechanics.  Time  6    Period  Weeks    Status  On-going      PT LONG TERM GOAL #3   Title  Patient will demonstrate ability to perform the 5 times sit to stand test in 10 seconds or less with equal weight bearing indicating improved balance and decreased risk of falls.    Time  6    Period  Weeks    Status  On-going      PT LONG TERM GOAL #4   Title  Patient will improve FOTO score to 39% or less limitation related to Rt knee impairments to indicate significant improvement in functional mobility.    Time  6    Period  Weeks    Status  On-going      PT LONG TERM GOAL #5   Title  Patient will demonstrate ability to perform ascending and descending 13 stairs with single handrail with reciprocal gait pattern for improved mechanics and navigation of stairs at patient's home.     Time  6    Period  Weeks    Status  On-going       Plan - 01/02/19 1040    Clinical Impression Statement  POC continued to focus on ROM exercises this session and patient was able to increased flexion range for stationary bike and achieved full revolutions.  She advanced repetition with exercise and was educated on TKE form to improve quad activation. This session initiated contract-relax stretch and patient noted discomfort throughout. EOS performed retrograde massage and scar mobilization to reduce soreness. Educated patient on self-mobilization for Rt patellofemoral joint to reduce stiffness and patient demonstrated good technique. She will continue to benefit from skilled PT interventions to address impairments and progress towards goals.     Examination-Activity Limitations  Stand;Stairs;Squat;Sit    Examination-Participation Restrictions  Community Activity;Driving    Stability/Clinical  Decision Making  Stable/Uncomplicated    Rehab Potential  Good    PT Frequency  3x / week    PT Duration  6 weeks    PT Treatment/Interventions  ADLs/Self Care Home Management;Aquatic Therapy;Cryotherapy;Electrical Stimulation;Moist Heat;DME Instruction;Gait training;Stair training;Functional mobility training;Therapeutic activities;Therapeutic exercise;Balance training;Neuromuscular re-education;Patient/family education;Manual techniques;Scar mobilization;Passive range of motion;Taping    PT Next Visit Plan  Continue with exercises focusing on ROM.  Manual interventions for edema and joint mobility and scar mobilization. Continue contract relax stretch and provide for HEP.    PT Home Exercise Plan  Eval: heel slide, quad set, assisted flexion stretch; 3/13: standing TKE; 01/02/19 - patella mobilization    Consulted and Agree with Plan of Care  Patient       Patient will benefit from skilled therapeutic intervention in order to improve the following deficits and impairments:  Abnormal gait, Decreased activity tolerance, Decreased endurance, Decreased range of motion, Decreased strength, Pain, Hypomobility, Decreased balance, Decreased mobility, Decreased scar mobility, Difficulty walking, Increased edema, Impaired flexibility  Visit Diagnosis: Acute pain of right knee  Stiffness of right knee, not elsewhere classified  Muscle weakness (generalized)  Other abnormalities of gait and mobility  Localized edema     Problem List Patient Active Problem List   Diagnosis Date Noted  . S/P total knee arthroplasty, left 11/23/2018  . Glaucoma 11/23/2018  . Primary localized osteoarthritis of right knee 12/06/2017  . Primary localized osteoarthritis of left knee 11/24/2017  . Acute medial meniscus tear of left knee   . Hypertension     Kipp Brood, PT, DPT, [facility] Physical Therapist with [facility]  [facility]  01/02/2019 12:21 PM    [facility] [address] [place], [place], [address] Phone: [phone]   Fax:  [phone]  Name: Nancy Steele MRN: [id number] Date of Birth: 06/10/1956

## 2019-01-02 NOTE — Patient Instructions (Signed)
Long Sitting 4 Way Patellar Glide reps: 3 sets: 1 hold: 30-45 seconds daily: 1  weekly: 7      Exercise image step 1   Exercise image step 2   Exercise image step 3   Exercise image step 4   Exercise image step 5 3x each direction, 30-45 seconds  Setup  Begin sitting upright with your legs straight. Movement  Place your fingers around your kneecap and gently move it inward. Hold briefly, then return to the starting position and repeat moving your knee cap outward, up, then down. Tip  Make sure to keep your leg muscles relaxed during the exercise.

## 2019-01-04 ENCOUNTER — Ambulatory Visit (HOSPITAL_COMMUNITY): Payer: 59 | Admitting: Physical Therapy

## 2019-01-04 ENCOUNTER — Other Ambulatory Visit: Payer: Self-pay

## 2019-01-04 ENCOUNTER — Encounter (HOSPITAL_COMMUNITY): Payer: Self-pay | Admitting: Physical Therapy

## 2019-01-04 DIAGNOSIS — M25561 Pain in right knee: Secondary | ICD-10-CM | POA: Diagnosis not present

## 2019-01-04 DIAGNOSIS — R2689 Other abnormalities of gait and mobility: Secondary | ICD-10-CM

## 2019-01-04 DIAGNOSIS — M25661 Stiffness of right knee, not elsewhere classified: Secondary | ICD-10-CM

## 2019-01-04 DIAGNOSIS — M6281 Muscle weakness (generalized): Secondary | ICD-10-CM

## 2019-01-04 DIAGNOSIS — R600 Localized edema: Secondary | ICD-10-CM

## 2019-01-04 NOTE — Therapy (Signed)
[place], [place], [address] Phone: [phone]   Fax:  [phone]  Physical Therapy Treatment  Patient Details  Name: Nancy Steele MRN: [id number] Date of Birth: 1956/07/18 Referring Provider (PT):  Matthew Saras, [place]   Encounter Date: 01/04/2019  PT End of Session - 01/04/19 1035    Visit Number  8    Number of Visits  18    Date for PT Re-Evaluation  01/30/19   Minireassess 01/09/19   Authorization Type  [employer]  (no auth requires, 60 visit limit)    Authorization Time Period  12/19/2018 - 01/30/2019    Authorization - Visit Number  8    Authorization - Number of Visits  60    PT Start Time  1031    PT Stop Time  1115    PT Time Calculation (min)  44 min    Activity Tolerance  Patient tolerated treatment well    Behavior During Therapy  [facility] for tasks assessed/performed       Past Medical History:  Diagnosis Date  . Arthritis   . DDD (degenerative disc disease), cervical   . Family history of adverse reaction to anesthesia    brother slow to wake up  . Glaucoma, left eye   . Hypertension   . OA (osteoarthritis)    knees  . Seasonal allergies   . Wears glasses     Past Surgical History:  Procedure Laterality Date  . CARPAL TUNNEL RELEASE Bilateral 2014  . CATARACT EXTRACTION W/ INTRAOCULAR LENS IMPLANT Left 2014  . CERVICAL FUSION  2000   C5 -- C6  . CHONDROPLASTY Left 10/17/2014   Procedure: CHONDROPLASTY;  Surgeon: Lorn Junes, MD;  Location: [facility];  Service: Orthopedics;  Laterality: Left;  . COLONOSCOPY    . KNEE ARTHROSCOPY Bilateral left , prior to 2015;  right 2009 approx.  Marland Kitchen KNEE ARTHROSCOPY WITH LATERAL MENISECTOMY Left 10/17/2014   Procedure: KNEE ARTHROSCOPY WITH LATERAL MENISECTOMY;  Surgeon: Lorn Junes, MD;  Location: [facility];  Service: Orthopedics;  Laterality: Left;  . KNEE ARTHROSCOPY WITH MEDIAL MENISECTOMY Left  10/17/2014   Procedure: LEFT KNEE ARTHROSCOPY WITH MEDIAL MENISCECTOMY;  Surgeon: Lorn Junes, MD;  Location: [facility];  Service: Orthopedics;  Laterality: Left;  . POLYPECTOMY    . TOTAL KNEE ARTHROPLASTY Left 12/06/2017   Procedure: LEFT TOTAL KNEE ARTHROPLASTY;  Surgeon: Elsie Saas, MD;  Location: [facility];  Service: Orthopedics;  Laterality: Left;  . TOTAL KNEE ARTHROPLASTY Right 12/05/2018   Procedure: RIGHT TOTAL KNEE ARTHROPLASTY;  Surgeon: Elsie Saas, MD;  Location: [facility];  Service: Orthopedics;  Laterality: Right;    There were no vitals filed for this visit.  Subjective Assessment - 01/04/19 1034    Subjective  Patient reported her right knee pain is about a 4/10 currently and that it is a nagging pain.     Limitations  House hold activities;Standing;Walking;Sitting    Currently in Pain?  Yes    Pain Score  4     Pain Location  Knee    Pain Orientation  Right    Pain Descriptors / Indicators  Tightness    Pain Type  Surgical pain    Pain Onset  1 to 4 weeks ago                       [facility] Adult PT Treatment/Exercise - 01/04/19  0001      Knee/Hip Exercises: Stretches   Active Hamstring Stretch  Right;3 reps;30 seconds    Active Hamstring Stretch Limitations  12" box    Knee: Self-Stretch to increase Flexion  Right;3 reps;30 seconds    Knee: Self-Stretch Limitations  12" box    Gastroc Stretch  3 reps;30 seconds    Gastroc Stretch Limitations  slant board      Knee/Hip Exercises: Aerobic   Stationary Bike  4 minutes, seat 13 full revolution min cueing to reduce hip compensation      Knee/Hip Exercises: Standing   Terminal Knee Extension  Right;2 sets;15 reps    Theraband Level (Terminal Knee Extension)  Level 4 (Blue)    Terminal Knee Extension Limitations  10" holds      Knee/Hip Exercises: Supine   Short Arc Quad Sets  Right;1 set;15 reps    Short Arc Quad Sets Limitations  5'' holds    Heel Slides  Right;1 set;15 reps     Heel Slides Limitations  5'' holds    Knee Extension  AROM    Knee Extension Limitations  4    Knee Flexion  AROM    Knee Flexion Limitations  115      Knee/Hip Exercises: Prone   Contract/Relax to Increase Flexion  Rt LE; 3x 5 sec contract/30 sec relax, manual overpressure from therapist      Manual Therapy   Manual Therapy  Edema management;Myofascial release;Joint mobilization    Manual therapy comments  Manual therapy complete separate than rest of tx    Edema Management  Retrograde massage with LE elevated    Joint Mobilization  3x 30 seconds patellofemoral joint mobilization (sup/inf and med/lat)    Myofascial Release  cross friction scar tissue mobilization             PT Education - 01/04/19 1035    Education Details  Educated purpose and technique of interventions throughout the session.     Person(s) Educated  Patient    Methods  Explanation    Comprehension  Verbalized understanding       PT Short Term Goals - 01/02/19 1040      PT SHORT TERM GOAL #1   Title  Patient will demonstrate understanding and report regular compliance with HEP.     Time  3    Period  Weeks    Status  Achieved    Target Date  01/09/19      PT SHORT TERM GOAL #2   Title  Patient will demonstrate right knee AROM extension/flexion of at least 5-100 degrees in order to assist patient with more normalized gait and stair ambulation.     Time  3    Period  Weeks    Status  On-going      PT SHORT TERM GOAL #3   Title  Patient will demonstrate improvement of 1/2 MMT grade in all deficient tested musculature in order to assist patient in performing ambulation and stair ambulation with improved mechanics.     Time  3    Period  Weeks    Status  On-going        PT Long Term Goals - 01/02/19 1040      PT LONG TERM GOAL #1   Title  Patient will demonstrate right knee AROM extension/flexion of at least 0-120 degrees in order to assist patient with ambulation, stairs, and squatting.      Time  6    Period  Weeks    Status  On-going      PT LONG TERM GOAL #2   Title  Patient will demonstrate improvement of 1 MMT grade or be 5/5 in all deficient tested musculature in order to assist patient in performing ambulation and stair ambulation with improved mechanics.     Time  6    Period  Weeks    Status  On-going      PT LONG TERM GOAL #3   Title  Patient will demonstrate ability to perform the 5 times sit to stand test in 10 seconds or less with equal weight bearing indicating improved balance and decreased risk of falls.    Time  6    Period  Weeks    Status  On-going      PT LONG TERM GOAL #4   Title  Patient will improve FOTO score to 39% or less limitation related to Rt knee impairments to indicate significant improvement in functional mobility.    Time  6    Period  Weeks    Status  On-going      PT LONG TERM GOAL #5   Title  Patient will demonstrate ability to perform ascending and descending 13 stairs with single handrail with reciprocal gait pattern for improved mechanics and navigation of stairs at patient's home.     Time  6    Period  Weeks    Status  On-going            Plan - 01/04/19 1125    Clinical Impression Statement  Continued with established plan of care this session. Increased the length of time holding terminal knee extension exercises this session. Ended session with manual therapy to improve patient's right knee AROM. Patient's right knee AROM was between 4 and 115 degrees at the end of this session. Patient would continue to benefit from skilled physical therapy in order to continue progressing towards functional goals.     Examination-Activity Limitations  Stand;Stairs;Squat;Sit    Examination-Participation Restrictions  Community Activity;Driving    Stability/Clinical Decision Making  Stable/Uncomplicated    Rehab Potential  Good    PT Frequency  3x / week    PT Duration  6 weeks    PT Treatment/Interventions  ADLs/Self Care Home  Management;Aquatic Therapy;Cryotherapy;Electrical Stimulation;Moist Heat;DME Instruction;Gait training;Stair training;Functional mobility training;Therapeutic activities;Therapeutic exercise;Balance training;Neuromuscular re-education;Patient/family education;Manual techniques;Scar mobilization;Passive range of motion;Taping    PT Next Visit Plan  Initiate step-ups at next session as able. Continue with exercises focusing on ROM.  Manual interventions for edema and joint mobility and scar mobilization. Continue contract relax stretch and provide for HEP.    PT Home Exercise Plan  Eval: heel slide, quad set, assisted flexion stretch; 3/13: standing TKE; 01/02/19 - patella mobilization    Consulted and Agree with Plan of Care  Patient       Patient will benefit from skilled therapeutic intervention in order to improve the following deficits and impairments:  Abnormal gait, Decreased activity tolerance, Decreased endurance, Decreased range of motion, Decreased strength, Pain, Hypomobility, Decreased balance, Decreased mobility, Decreased scar mobility, Difficulty walking, Increased edema, Impaired flexibility  Visit Diagnosis: Acute pain of right knee  Stiffness of right knee, not elsewhere classified  Muscle weakness (generalized)  Other abnormalities of gait and mobility  Localized edema     Problem List Patient Active Problem List   Diagnosis Date Noted  . S/P total knee arthroplasty, left 11/23/2018  . Glaucoma 11/23/2018  . Primary localized osteoarthritis of right  knee 12/06/2017  . Primary localized osteoarthritis of left knee 11/24/2017  . Acute medial meniscus tear of left knee   . Hypertension    Clarene Critchley PT, DPT 11:27 AM, 01/04/19 [facility] [address] [place], [place], [address] Phone: [phone]   Fax:  [phone]  Name: Nancy Steele MRN: [id number] Date of Birth: Jul 12, 1956

## 2019-01-06 ENCOUNTER — Ambulatory Visit (HOSPITAL_COMMUNITY): Payer: 59

## 2019-01-06 ENCOUNTER — Telehealth (HOSPITAL_COMMUNITY): Payer: Self-pay

## 2019-01-06 NOTE — Telephone Encounter (Signed)
Called and spoke with pt concerning need to close office until 01/23/19 due to COVID-19 for our patient's and staff well being to reduce spread of virus in our community.  Pt understanding and verbalized understanding.  Reviewed compliance with HEP.  Does wish to receive weekly calls reviewing progress.    [address], [place]; [facility] [phone]

## 2019-01-09 ENCOUNTER — Ambulatory Visit (HOSPITAL_COMMUNITY): Payer: 59

## 2019-01-11 ENCOUNTER — Ambulatory Visit (HOSPITAL_COMMUNITY): Payer: 59

## 2019-01-12 ENCOUNTER — Telehealth (HOSPITAL_COMMUNITY): Payer: Self-pay | Admitting: Physical Therapy

## 2019-01-12 NOTE — Telephone Encounter (Signed)
Therapist called to discuss patient's progress with HEP. Patient reported she was doing well, and was still trying to focus on getting her leg to fully straighten. Therapist explained that the patient could try prone knee hangs and patient gave her understanding. Therapist also stated that if she felt she was losing any ROM or had any questions or concerns she could call the clinic. Therapist also reminded the patient of her next scheduled appointment.   Clarene Critchley PT, DPT 12:24 PM, 01/12/19 [phone]

## 2019-01-13 ENCOUNTER — Ambulatory Visit (HOSPITAL_COMMUNITY): Payer: 59 | Admitting: Physical Therapy

## 2019-01-16 ENCOUNTER — Encounter (HOSPITAL_COMMUNITY): Payer: 59

## 2019-01-18 ENCOUNTER — Encounter (HOSPITAL_COMMUNITY): Payer: 59

## 2019-01-19 ENCOUNTER — Telehealth (HOSPITAL_COMMUNITY): Payer: Self-pay

## 2019-01-19 NOTE — Telephone Encounter (Signed)
Spoke to pt regarding cancelling his appointments until further notice due to COVID-19 in order to ensure pt and staff safety during this time. Pt not interested in telehealth sessions or updated HEP at this time but was agreeable to weekly phone calls to check in on her status. Told pt that we would call her to reschedule once our clinic reopens and she verbalized understanding. PT also informed pt that if she feels like her TKA is regressing, she can call our office and we will work to try and get her an in-person visit for an assessment and POC going forward at that time and she verbalized understanding.   [name] PT, DPT

## 2019-01-20 ENCOUNTER — Encounter (HOSPITAL_COMMUNITY): Payer: 59 | Admitting: Physical Therapy

## 2019-01-23 ENCOUNTER — Ambulatory Visit (HOSPITAL_COMMUNITY): Payer: 59

## 2019-01-25 ENCOUNTER — Encounter (HOSPITAL_COMMUNITY): Payer: 59

## 2019-01-26 ENCOUNTER — Telehealth (HOSPITAL_COMMUNITY): Payer: Self-pay | Admitting: Physical Therapy

## 2019-01-26 NOTE — Telephone Encounter (Signed)
Nancy Steele was contacted today regarding temporary reduction of Outpatient Rehabilitation Services at [facility] due to concerns for community transmission of COVID-19.  Patient identity was verified.  Assessed if patient needed to be seen in person by clinician (recent fall or acute injury that requires hands on assessment and advice, change in diet order, post-surgical, special cases, etc.).    Did have an acute/special need that requires in person visit.  Determined a time for patient to come in next week for a follow-up visit regarding her TKA. Patient stated she was not sure about Tele-health and would like to wait until after her in-person visit to decide if she needs further therapy. Patient did not have any present questions about HEP or other.   Patient is aware we can be reached by telephone during limited business hours in the meantime.    Clarene Critchley PT, DPT 4:20 PM, 01/26/19 [phone]

## 2019-01-27 ENCOUNTER — Encounter (HOSPITAL_COMMUNITY): Payer: 59

## 2019-02-01 ENCOUNTER — Ambulatory Visit (HOSPITAL_COMMUNITY): Payer: 59 | Attending: Physician Assistant

## 2019-02-01 ENCOUNTER — Encounter (HOSPITAL_COMMUNITY): Payer: Self-pay

## 2019-02-01 ENCOUNTER — Other Ambulatory Visit: Payer: Self-pay

## 2019-02-01 DIAGNOSIS — M25661 Stiffness of right knee, not elsewhere classified: Secondary | ICD-10-CM | POA: Diagnosis present

## 2019-02-01 DIAGNOSIS — R2689 Other abnormalities of gait and mobility: Secondary | ICD-10-CM | POA: Diagnosis present

## 2019-02-01 DIAGNOSIS — R600 Localized edema: Secondary | ICD-10-CM

## 2019-02-01 DIAGNOSIS — M6281 Muscle weakness (generalized): Secondary | ICD-10-CM

## 2019-02-01 DIAGNOSIS — M25561 Pain in right knee: Secondary | ICD-10-CM

## 2019-02-01 NOTE — Therapy (Signed)
[place], [place], [address] Phone: [phone]   Fax:  [phone]  Physical Therapy Treatment Discharge Summary  Patient Details  Name: Nancy Steele MRN: [id number] Date of Birth: [birth year] Referring Provider (PT):  Matthew Saras, [place]   Encounter Date: 02/01/2019  PHYSICAL THERAPY DISCHARGE SUMMARY  Visits from Start of Care: 9  Current functional level related to goals / functional outcomes: Re-assessment performed today and patient has made excellent progress with HEP and has achieved all short and long term goals. She has achieved AROM on Rt knee of 0-118 and no longer has difficulty walking or ascending/descending stairs. She was instructed on floor<>stnad transfers today as she needs to do so while interacting with her granddaughter. She demonstrated good technique and steadiness with no external support and using her UE's on the Rt thigh to stand up from half kneeling. She was educated on sleep hygiene tips to improve her sleeping as she reports this is still limited and she wakes up ~1x/night. She made a significant improvement on FOTO and has reported feeling confident she can continue with her current HEP and riding the bike. She will be discharged based on current functional status.   Remaining deficits: See below details.   Education / Equipment: Educated on progress since evaluation and current ROM. Educated on floor to stand transfer technique for no UE support on furniture. Educated on and provided sleep hygiene handout as patient is having ongoing difficulty sleeping.   Plan: Patient agrees to discharge.  Patient goals were met. Patient is being discharged due to meeting the stated rehab goals.  ?????      PT End of Session - 02/01/19 1158    Visit Number  9    Number of Visits  18    Date for PT Re-Evaluation  01/30/19   Minireassess 01/09/19   Authorization Type  [employer]   (no auth requires, 60 visit limit)    Authorization Time Period  12/19/2018 - 01/30/2019    Authorization - Visit Number  9    Authorization - Number of Visits  60    PT Start Time  1122    PT Stop Time  1154    PT Time Calculation (min)  32 min    Activity Tolerance  Patient tolerated treatment well    Behavior During Therapy  WFL for tasks assessed/performed       Past Medical History:  Diagnosis Date  . Arthritis   . DDD (degenerative disc disease), cervical   . Family history of adverse reaction to anesthesia    brother slow to wake up  . Glaucoma, left eye   . Hypertension   . OA (osteoarthritis)    knees  . Seasonal allergies   . Wears glasses     Past Surgical History:  Procedure Laterality Date  . CARPAL TUNNEL RELEASE Bilateral 2014  . CATARACT EXTRACTION W/ INTRAOCULAR LENS IMPLANT Left 2014  . CERVICAL FUSION  2000   C5 -- C6  . CHONDROPLASTY Left 10/17/2014   Procedure: CHONDROPLASTY;  Surgeon: Lorn Junes, MD;  Location: [facility];  Service: Orthopedics;  Laterality: Left;  . COLONOSCOPY    . KNEE ARTHROSCOPY Bilateral left , prior to 2015;  right 2009 approx.  Marland Kitchen KNEE ARTHROSCOPY WITH LATERAL MENISECTOMY Left 10/17/2014   Procedure: KNEE ARTHROSCOPY WITH LATERAL MENISECTOMY;  Surgeon: Lorn Junes, MD;  Location: [facility];  Service: Orthopedics;  Laterality:  Left;  . KNEE ARTHROSCOPY WITH MEDIAL MENISECTOMY Left 10/17/2014   Procedure: LEFT KNEE ARTHROSCOPY WITH MEDIAL MENISCECTOMY;  Surgeon: Lorn Junes, MD;  Location: [facility];  Service: Orthopedics;  Laterality: Left;  . POLYPECTOMY    . TOTAL KNEE ARTHROPLASTY Left 12/06/2017   Procedure: LEFT TOTAL KNEE ARTHROPLASTY;  Surgeon: Elsie Saas, MD;  Location: [facility];  Service: Orthopedics;  Laterality: Left;  . TOTAL KNEE ARTHROPLASTY Right 12/05/2018   Procedure: RIGHT TOTAL KNEE ARTHROPLASTY;  Surgeon: Elsie Saas, MD;  Location: [facility];  Service:  Orthopedics;  Laterality: Right;    There were no vitals filed for this visit.  Subjective Assessment - 02/01/19 1125    Subjective  Patient reports she is doing really weel and has kept up with her exercises every day and is riding her bike 2-3 days per week. She reports she has not trouble with stairs any longer and does feel like she is ready to be done with therapy if she has achieved full ROM.     Limitations  House hold activities;Standing;Walking;Sitting    Currently in Pain?  No/denies    Pain Onset  --         [facility] PT Assessment - 02/01/19 0001      Assessment   Medical Diagnosis  Rt TKA    Referring Provider (PT)   Matthew Saras, PA-C    Onset Date/Surgical Date  12/05/18    Next MD Visit  01/16/19    Prior Therapy  HHPT      Prior Function   Level of Independence  Independent      Cognition   Overall Cognitive Status  Within Functional Limits for tasks assessed      Observation/Other Assessments   Focus on Therapeutic Outcomes (FOTO)   38% limited   was 63% limited     AROM   Right Knee Extension  0   11   Right Knee Flexion  118   82     PROM   Right Knee Extension  0   8   Right Knee Flexion  121   90     Strength   Right Hip Flexion  5/5    Right Hip Extension  5/5    Right Hip ABduction  5/5    Left Hip Flexion  5/5    Left Hip Extension  5/5    Left Hip ABduction  5/5    Right Knee Flexion  5/5    Right Knee Extension  5/5    Left Knee Flexion  5/5    Left Knee Extension  5/5    Right Ankle Dorsiflexion  5/5    Right Ankle Plantar Flexion  5/5    Left Ankle Dorsiflexion  4+/5    Left Ankle Plantar Flexion  5/5      Transfers   Transfers  Floor to Transfer    Five time sit to stand comments   7 without UE support   12.4   Floor to Transfer  6: Modified independent (Device/Increase time)    Comments  Floor to stand transfer performed 4 times going from sit>tall kneel>half kneel>stand. Pt used bil UE's on Rt thigh to push up to  stand.       Ambulation/Gait   Ambulation/Gait Assistance  7: Independent    Gait Pattern  Within Functional Limits       PT Education - 02/01/19 1213    Education Details  Educated on progress since  evaluation and current ROM. Educated on floor to stand transfer technique for no UE support on furniture. Educated on and provided sleep hygiene handout as patient is having ongoing difficulty sleeping.     Person(s) Educated  Patient    Methods  Explanation;Handout    Comprehension  Verbalized understanding       PT Short Term Goals - 02/01/19 1200      PT SHORT TERM GOAL #1   Title  Patient will demonstrate understanding and report regular compliance with HEP.     Time  3    Period  Weeks    Status  Achieved    Target Date  01/09/19      PT SHORT TERM GOAL #2   Title  Patient will demonstrate right knee AROM extension/flexion of at least 5-100 degrees in order to assist patient with more normalized gait and stair ambulation.     Time  3    Period  Weeks    Status  Achieved      PT SHORT TERM GOAL #3   Title  Patient will demonstrate improvement of 1/2 MMT grade in all deficient tested musculature in order to assist patient in performing ambulation and stair ambulation with improved mechanics.     Time  3    Period  Weeks    Status  Achieved        PT Long Term Goals - 02/01/19 1201      PT LONG TERM GOAL #1   Title  Patient will demonstrate right knee AROM extension/flexion of at least 0-120 degrees in order to assist patient with ambulation, stairs, and squatting.     Time  6    Period  Weeks    Status  Achieved      PT LONG TERM GOAL #2   Title  Patient will demonstrate improvement of 1 MMT grade or be 5/5 in all deficient tested musculature in order to assist patient in performing ambulation and stair ambulation with improved mechanics.     Time  6    Period  Weeks    Status  Achieved      PT LONG TERM GOAL #3   Title  Patient will demonstrate ability to  perform the 5 times sit to stand test in 10 seconds or less with equal weight bearing indicating improved balance and decreased risk of falls.    Time  6    Period  Weeks    Status  Achieved      PT LONG TERM GOAL #4   Title  Patient will improve FOTO score to 39% or less limitation related to Rt knee impairments to indicate significant improvement in functional mobility.    Time  6    Period  Weeks    Status  Achieved      PT LONG TERM GOAL #5   Title  Patient will demonstrate ability to perform ascending and descending 13 stairs with single handrail with reciprocal gait pattern for improved mechanics and navigation of stairs at patient's home.     Time  6    Period  Weeks    Status  Achieved       Plan - 02/01/19 1159    Clinical Impression Statement  Re-assessment performed today and patient has made excellent progress with HEP and has achieved all short and long term goals. She has achieved AROM on Rt knee of 0-118 and no longer has difficulty walking or ascending/descending stairs. She was instructed  on floor<>stnad transfers today as she needs to do so while interacting with her granddaughter. She demonstrated good technique and steadiness with no external support and using her UE's on the Rt thigh to stand up from half kneeling. She was educated on sleep hygiene tips to improve her sleeping as she reports this is still limited and she wakes up ~1x/night. She made a significant improvement on FOTO and has reported feeling confident she can continue with her current HEP and riding the bike. She will be discharged based on current functional status.    Examination-Activity Limitations  Stand;Stairs;Squat;Sit    Examination-Participation Restrictions  Community Activity;Driving    Stability/Clinical Decision Making  Stable/Uncomplicated    Rehab Potential  Good    PT Frequency  3x / week    PT Duration  6 weeks    PT Treatment/Interventions  ADLs/Self Care Home Management;Aquatic  Therapy;Cryotherapy;Electrical Stimulation;Moist Heat;DME Instruction;Gait training;Stair training;Functional mobility training;Therapeutic activities;Therapeutic exercise;Balance training;Neuromuscular re-education;Patient/family education;Manual techniques;Scar mobilization;Passive range of motion;Taping    PT Next Visit Plan  Discharge with current HEP and sleep hygiene handout.    PT Home Exercise Plan  Eval: heel slide, quad set, assisted flexion stretch; 3/13: standing TKE; 01/02/19 - patella mobilization    Consulted and Agree with Plan of Care  Patient       Patient will benefit from skilled therapeutic intervention in order to improve the following deficits and impairments:  Abnormal gait, Decreased activity tolerance, Decreased endurance, Decreased range of motion, Decreased strength, Pain, Hypomobility, Decreased balance, Decreased mobility, Decreased scar mobility, Difficulty walking, Increased edema, Impaired flexibility  Visit Diagnosis: Acute pain of right knee  Stiffness of right knee, not elsewhere classified  Muscle weakness (generalized)  Other abnormalities of gait and mobility  Localized edema     Problem List Patient Active Problem List   Diagnosis Date Noted  . S/P total knee arthroplasty, left 11/23/2018  . Glaucoma 11/23/2018  . Primary localized osteoarthritis of right knee 12/06/2017  . Primary localized osteoarthritis of left knee 11/24/2017  . Acute medial meniscus tear of left knee   . Hypertension      Kipp Brood, PT, DPT, [facility] Physical Therapist with [facility]  02/01/2019 12:18 PM    [facility] [address] [place], [place], [address] Phone: [phone]   Fax:  [phone]  Name: MAHDIYA MOSSBERG MRN: [id number] Date of Birth: 03-10-1956

## 2019-03-12 IMAGING — DX DG KNEE 1-2V PORT*L*
2 series · 2 of 2 positions shown · non-contrast
Comparison: 12/06/2017 none

CLINICAL DATA: Knee replacement

EXAM:
PORTABLE LEFT KNEE - 1-2 VIEW

[knee ap]
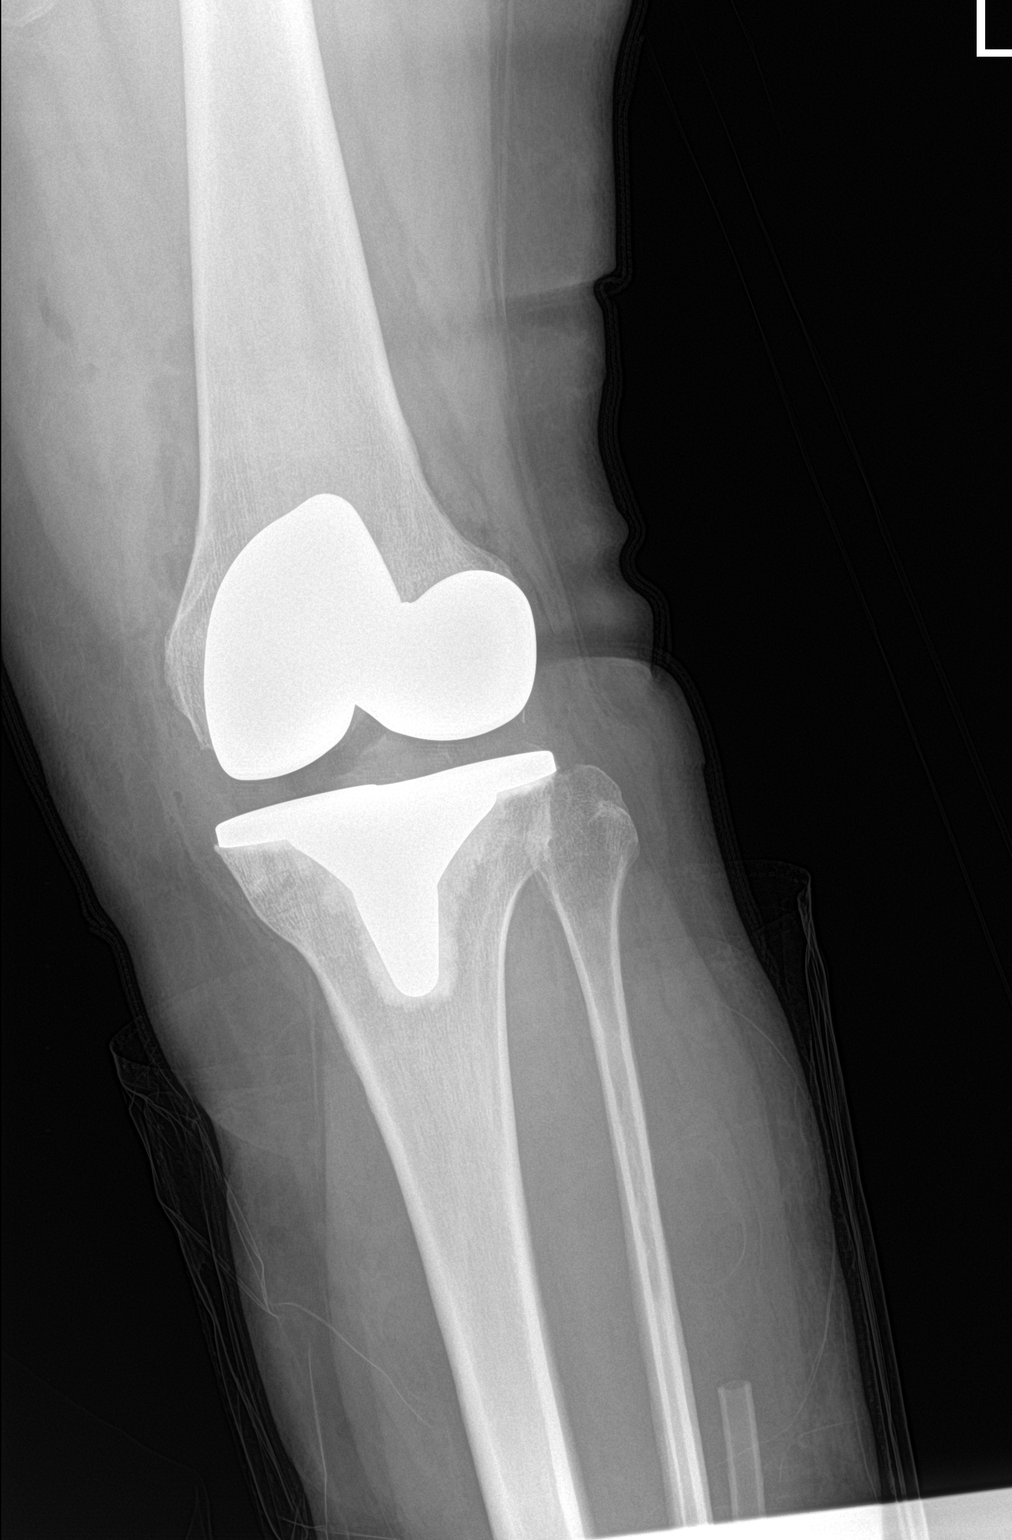

[knee lat]
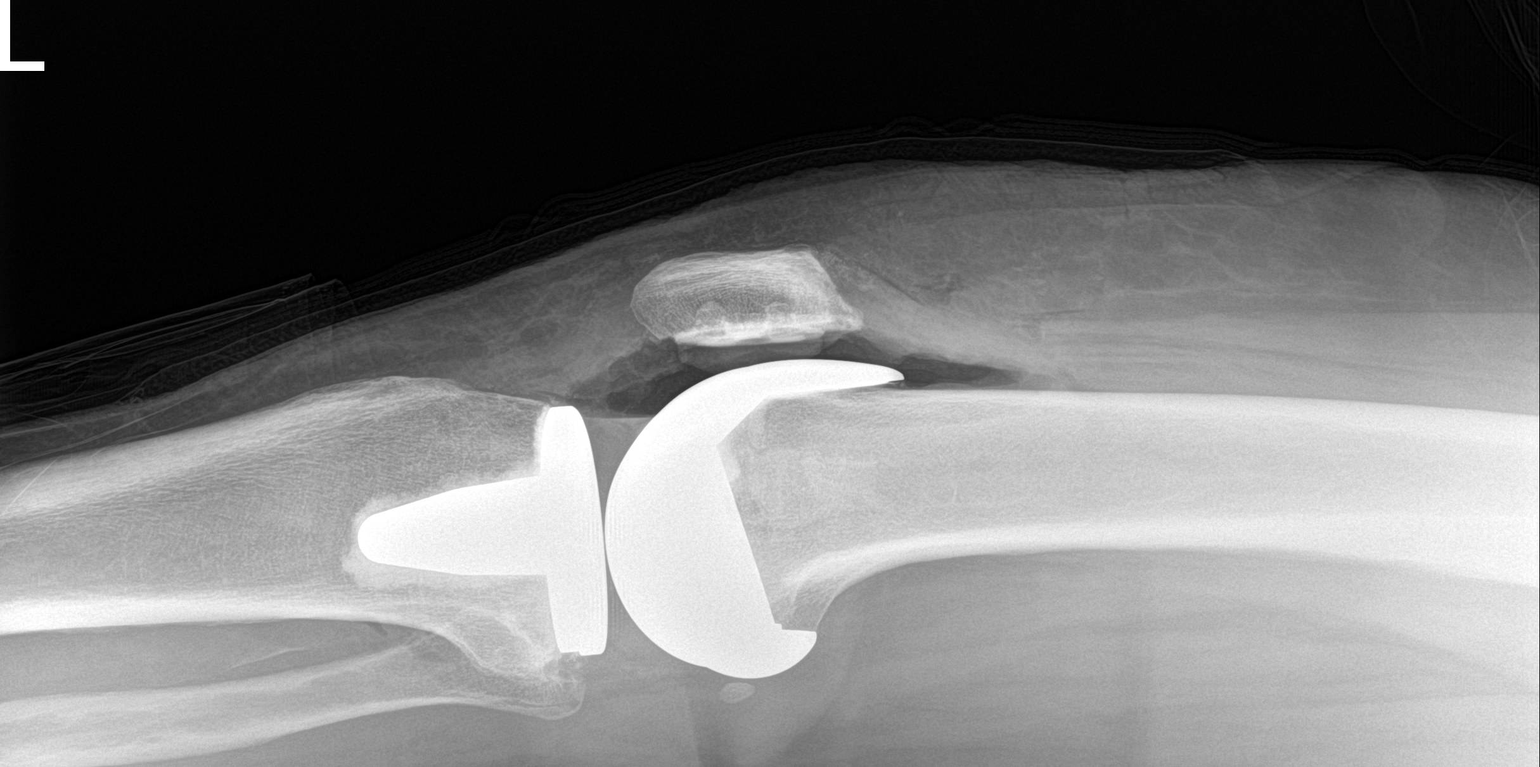

[2 of 2 positions shown; findings below may reference images not displayed]

FINDINGS: Total knee replacement in satisfactory position alignment. Gas in
the knee joint. No fracture or immediate complication
IMPRESSION: Satisfactory left knee replacement.

## 2019-06-19 ENCOUNTER — Other Ambulatory Visit: Payer: Self-pay | Admitting: Obstetrics

## 2019-06-19 DIAGNOSIS — Z1231 Encounter for screening mammogram for malignant neoplasm of breast: Secondary | ICD-10-CM

## 2019-07-05 ENCOUNTER — Other Ambulatory Visit: Payer: Self-pay

## 2019-07-06 ENCOUNTER — Ambulatory Visit: Payer: 59 | Admitting: Family Medicine

## 2019-07-06 ENCOUNTER — Encounter: Payer: Self-pay | Admitting: Family Medicine

## 2019-07-06 VITALS — BP 110/70 | HR 70 | Temp 98.3°F | Resp 16 | Ht 68.0 in | Wt 209.0 lb

## 2019-07-06 DIAGNOSIS — Z23 Encounter for immunization: Secondary | ICD-10-CM

## 2019-07-06 DIAGNOSIS — R8281 Pyuria: Secondary | ICD-10-CM

## 2019-07-06 LAB — URINALYSIS, ROUTINE W REFLEX MICROSCOPIC
Bilirubin Urine: NEGATIVE
Glucose, UA: NEGATIVE
Hgb urine dipstick: NEGATIVE
Ketones, ur: NEGATIVE
Leukocytes,Ua: NEGATIVE
Nitrite: NEGATIVE
Protein, ur: NEGATIVE
Specific Gravity, Urine: 1.02 (ref 1.001–1.03)
pH: 7 (ref 5.0–8.0)

## 2019-07-06 NOTE — Progress Notes (Signed)
Subjective:    Patient ID: Nancy Steele, female    DOB: 19-Oct-1956, 63 y.o.   MRN: [id number]  HPI Patient is a very pleasant 63 year old female who is here today to follow-up her recent lab studies she had performed at [facility]  She was undergoing evaluation for possible kidney donation.  In the work-up, her GFR was found to be greater than 90.  However on a urine sample she was found to have +2 leukocyte esterase, 20-50 white blood cells per high-powered field, and bacteria.  Any symptoms of urinary tract infection.  However she was instructed to follow-up here.  She states that she feels fine.  We discussed the method for acquiring a urine sample and she believes that it may be a contaminated specimen.  Otherwise she is doing well with no concerns.  Of note she did have a positive IgG for EBV as well as HSV 1.  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 Past Medical History:  Diagnosis Date  . Arthritis   . DDD (degenerative disc disease), cervical   . Family history of adverse reaction to anesthesia    brother slow to wake up  . Glaucoma, left eye   . Hypertension   . OA (osteoarthritis)    knees  . Seasonal allergies   . Wears glasses    Past Surgical History:  Procedure Laterality Date  . CARPAL TUNNEL RELEASE Bilateral 2014  . CATARACT EXTRACTION W/ INTRAOCULAR LENS IMPLANT Left 2014  . CERVICAL FUSION  2000   C5 -- C6  . CHONDROPLASTY Left 10/17/2014   Procedure: CHONDROPLASTY;  Surgeon: Lorn Junes, MD;  Location: [place];  Service: Orthopedics;  Laterality: Left;  . COLONOSCOPY    . KNEE ARTHROSCOPY Bilateral left , prior to 2015;  right 2009 approx.  Marland Kitchen KNEE ARTHROSCOPY WITH LATERAL MENISECTOMY Left 10/17/2014   Procedure: KNEE ARTHROSCOPY WITH LATERAL MENISECTOMY;  Surgeon: Lorn Junes, MD;  Location: [place];  Service: Orthopedics;  Laterality: Left;  . KNEE ARTHROSCOPY WITH MEDIAL MENISECTOMY Left 10/17/2014   Procedure: LEFT KNEE ARTHROSCOPY WITH MEDIAL MENISCECTOMY;  Surgeon: Lorn Junes, MD;  Location: [place];  Service: Orthopedics;  Laterality: Left;  . POLYPECTOMY    . TOTAL KNEE ARTHROPLASTY Left 12/06/2017   Procedure: LEFT TOTAL KNEE ARTHROPLASTY;  Surgeon: Elsie Saas, MD;  Location: [place];  Service: Orthopedics;  Laterality: Left;  . TOTAL KNEE ARTHROPLASTY Right 12/05/2018   Procedure: RIGHT TOTAL KNEE ARTHROPLASTY;  Surgeon: Elsie Saas, MD;  Location: [facility];  Service: Orthopedics;  Laterality: Right;   Current Outpatient Medications on File Prior to Visit  Medication Sig Dispense Refill  . aspirin EC 81 MG tablet Take 1 tablet (81 mg total) by mouth 2 (two) times daily. For DVT prophylaxis for 30 days after surgery. 60 tablet 0  . Calcium Carb-Cholecalciferol (CALTRATE 600+D) 600-800 MG-UNIT TABS Take 1 tablet by mouth daily.     . COMBIGAN 0.2-0.5 % ophthalmic solution Place 1 drop into the left eye 2 (two) times daily.   3  . docusate sodium (COLACE) 100 MG capsule Take 1 capsule (100 mg total) by mouth 2 (two) times daily. To prevent constipation while taking pain medication. (Patient not taking: Reported on 12/19/2018) 40 capsule 0  . gabapentin (NEURONTIN) 300 MG capsule Take 1 capsule (  300 mg total) by mouth 2 (two) times daily for 14 days. For 2 weeks post op for pain. 28 capsule 0  . Liraglutide -Weight Management (SAXENDA) 18 MG/3ML SOPN Inject 0.6 mg into the skin daily.    Marland Kitchen lisinopril (PRINIVIL,ZESTRIL) 10 MG tablet TAKE 1 TABLET BY MOUTH EVERY DAY (Patient taking differently: Take 10 mg by mouth daily. ) 90 tablet 3  . methocarbamol (ROBAXIN) 500 MG tablet Take 1 tablet (500 mg total) by mouth every 8 (eight) hours as needed for muscle spasms. 40 tablet 0  . Multiple Vitamin (MULTIVITAMIN WITH MINERALS) TABS tablet Take 1 tablet by mouth  daily.    . ondansetron (ZOFRAN) 4 MG tablet Take 1 tablet (4 mg total) by mouth every 8 (eight) hours as needed for nausea or vomiting. 20 tablet 0  . Polyethyl Glycol-Propyl Glycol (SYSTANE ULTRA) 0.4-0.3 % SOLN Place 1-2 drops into both eyes 3 (three) times daily as needed (for dry/irritated eyes.).     No current facility-administered medications on file prior to visit.    Allergies  Allergen Reactions  . Penicillins Swelling, Rash and Other (See Comments)    #####Patient has had Ancef for multiple surgeries without a reaction######   SWELLING OF THE THROAT, OTHER CHILDHOOD REACTION PCN REACTION WITH IMMEDIATE RASH, FACIAL/TONGUE/THROAT SWELLING, SOB, OR LIGHTHEADEDNESS WITH HYPOTENSION:  #  #  #  YES  #  #  #  Has patient had a PCN reaction causing severe rash involving mucus membranes or skin necrosis: Unknown Has patient had a PCN reaction that required hospitalization: No Has patient had a PCN reaction occurring within the last 10 years: No      . Sulfa Antibiotics Nausea And Vomiting  . Sulfasalazine Nausea And Vomiting  . Vancomycin Hives   Social History   Socioeconomic History  . Marital status: Married    Spouse name: Not on file  . Number of children: Not on file  . Years of education: Not on file  . Highest education level: Not on file  Occupational History  . Not on file  Social Needs  . Financial resource strain: Not on file  . Food insecurity    Worry: Not on file    Inability: Not on file  . Transportation needs    Medical: Not on file    Non-medical: Not on file  Tobacco Use  . Smoking status: Never Smoker  . Smokeless tobacco: Never Used  Substance and Sexual Activity  . Alcohol use: Not Currently  . Drug use: Never  . Sexual activity: Not on file  Lifestyle  . Physical activity    Days per week: Not on file    Minutes per session: Not on file  . Stress: Not on file  Relationships  . Social Herbalist on phone: Not on file    Gets  together: Not on file    Attends religious service: Not on file    Active member of club or organization: Not on file    Attends meetings of clubs or organizations: Not on file    Relationship status: Not on file  . Intimate partner violence    Fear of current or ex partner: Not on file    Emotionally abused: Not on file    Physically abused: Not on file    Forced sexual activity: Not on file  Other Topics Concern  . Not on file  Social History Narrative  . Not on file  Review of Systems  All other systems reviewed and are negative.      Objective:   Physical Exam  Constitutional: She is oriented to person, place, and time. She appears well-developed and well-nourished. No distress.  HENT:  Head: Normocephalic and atraumatic.  Right Ear: External ear normal.  Left Ear: External ear normal.  Nose: Nose normal.  Mouth/Throat: Oropharynx is clear and moist. No oropharyngeal exudate.  Eyes: Pupils are equal, round, and reactive to light. EOM are normal. Right eye exhibits no discharge. Left eye exhibits no discharge. No scleral icterus.  Neck: Normal range of motion. Neck supple. No JVD present. No tracheal deviation present. No thyromegaly present.  Cardiovascular: Normal rate, regular rhythm, normal heart sounds and intact distal pulses.  No murmur heard. Pulmonary/Chest: Effort normal and breath sounds normal. No stridor. No respiratory distress. She has no wheezes. She has no rales. She exhibits no tenderness.  Abdominal: Soft. Bowel sounds are normal. She exhibits no distension and no mass. There is no abdominal tenderness. There is no rebound and no guarding.  Musculoskeletal:        General: No edema.  Lymphadenopathy:    She has no cervical adenopathy.  Neurological: She is alert and oriented to person, place, and time. She has normal reflexes. No cranial nerve deficit. She exhibits normal muscle tone. Coordination normal.  Skin: Skin is warm. No rash noted. She is  not diaphoretic. No erythema. No pallor.  Psychiatric: She has a normal mood and affect. Her behavior is normal. Judgment and thought content normal.  Vitals reviewed.         Assessment & Plan:  [place] - Plan: Urinalysis, Routine w reflex microscopic, Urine Culture, BASIC METABOLIC PANEL WITH GFR  Renal function is outstanding for age.  I believe this likely represents contamination.  Therefore I would obtain a urinalysis as well as a urine culture and if normal no further work-up is necessary.  If there is in fact bacteriuria, we may need to consider possibly having the patient see urology for a cystoscopy however she is asymptomatic and therefore I feel no antibiotics are necessary at this time.

## 2019-07-06 NOTE — Addendum Note (Signed)
Addended by: Shary Decamp B on: 07/06/2019 10:37 AM   Modules accepted: Orders

## 2019-07-07 LAB — BASIC METABOLIC PANEL WITH GFR
BUN: 16 mg/dL (ref 7–25)
CO2: 27 mmol/L (ref 20–32)
Calcium: 9.6 mg/dL (ref 8.6–10.4)
Chloride: 106 mmol/L (ref 98–110)
Creat: 0.82 mg/dL (ref 0.50–0.99)
GFR, Est African American: 90 mL/min/{1.73_m2} (ref >=60)
GFR, Est Non African American: 77 mL/min/{1.73_m2} (ref >=60)
Glucose, Bld: 93 mg/dL (ref 65–99)
Potassium: 4.6 mmol/L (ref 3.5–5.3)
Sodium: 141 mmol/L (ref 135–146)

## 2019-07-07 LAB — URINE CULTURE
MICRO NUMBER:: 893006
SPECIMEN QUALITY:: ADEQUATE

## 2019-08-04 ENCOUNTER — Ambulatory Visit
Admission: RE | Admit: 2019-08-04 | Discharge: 2019-08-04 | Disposition: A | Discharge status: Home / Self Care | Payer: 59 | Source: Ambulatory Visit | Attending: Obstetrics | Admitting: Obstetrics

## 2019-08-04 ENCOUNTER — Other Ambulatory Visit: Payer: Self-pay

## 2019-08-04 DIAGNOSIS — Z1231 Encounter for screening mammogram for malignant neoplasm of breast: Secondary | ICD-10-CM

## 2019-10-07 ENCOUNTER — Other Ambulatory Visit: Payer: Self-pay | Admitting: Family Medicine

## 2019-10-09 ENCOUNTER — Ambulatory Visit: Payer: 59 | Attending: Internal Medicine

## 2019-10-09 ENCOUNTER — Other Ambulatory Visit: Payer: Self-pay

## 2019-10-09 DIAGNOSIS — Z20822 Contact with and (suspected) exposure to covid-19: Secondary | ICD-10-CM

## 2019-10-10 LAB — NOVEL CORONAVIRUS, NAA: SARS-CoV-2, NAA: NOT DETECTED

## 2020-08-01 ENCOUNTER — Other Ambulatory Visit: Payer: Self-pay | Admitting: Obstetrics

## 2020-08-01 DIAGNOSIS — Z1231 Encounter for screening mammogram for malignant neoplasm of breast: Secondary | ICD-10-CM

## 2020-08-27 ENCOUNTER — Ambulatory Visit
Admission: RE | Admit: 2020-08-27 | Discharge: 2020-08-27 | Disposition: A | Discharge status: Home / Self Care | Payer: 59 | Source: Ambulatory Visit | Attending: Obstetrics | Admitting: Obstetrics

## 2020-08-27 ENCOUNTER — Other Ambulatory Visit: Payer: Self-pay

## 2020-08-27 DIAGNOSIS — Z1231 Encounter for screening mammogram for malignant neoplasm of breast: Secondary | ICD-10-CM

## 2020-10-15 ENCOUNTER — Other Ambulatory Visit: Payer: Self-pay | Admitting: Family Medicine

## 2020-10-15 DIAGNOSIS — I10 Essential (primary) hypertension: Secondary | ICD-10-CM

## 2020-11-14 ENCOUNTER — Other Ambulatory Visit: Payer: Self-pay | Admitting: Family Medicine

## 2020-11-14 DIAGNOSIS — I10 Essential (primary) hypertension: Secondary | ICD-10-CM

## 2020-11-24 ENCOUNTER — Other Ambulatory Visit: Payer: Self-pay | Admitting: Family Medicine

## 2020-11-24 DIAGNOSIS — I10 Essential (primary) hypertension: Secondary | ICD-10-CM

## 2021-07-15 ENCOUNTER — Encounter: Payer: Self-pay | Admitting: Internal Medicine

## 2021-08-27 ENCOUNTER — Other Ambulatory Visit: Payer: Self-pay | Admitting: Obstetrics

## 2021-08-27 DIAGNOSIS — Z1231 Encounter for screening mammogram for malignant neoplasm of breast: Secondary | ICD-10-CM

## 2021-09-03 ENCOUNTER — Ambulatory Visit
Admission: RE | Admit: 2021-09-03 | Discharge: 2021-09-03 | Disposition: A | Discharge status: Home / Self Care | Payer: 59 | Source: Ambulatory Visit | Attending: Obstetrics | Admitting: Obstetrics

## 2021-09-03 ENCOUNTER — Other Ambulatory Visit: Payer: Self-pay

## 2021-09-03 DIAGNOSIS — Z1231 Encounter for screening mammogram for malignant neoplasm of breast: Secondary | ICD-10-CM

## 2021-10-07 ENCOUNTER — Encounter: Payer: Self-pay | Admitting: Internal Medicine

## 2021-11-20 ENCOUNTER — Other Ambulatory Visit: Payer: Self-pay

## 2021-11-20 ENCOUNTER — Ambulatory Visit (AMBULATORY_SURGERY_CENTER): Payer: 59 | Admitting: *Deleted

## 2021-11-20 ENCOUNTER — Encounter: Payer: Self-pay | Admitting: Internal Medicine

## 2021-11-20 VITALS — Ht 68.0 in | Wt 185.0 lb

## 2021-11-20 DIAGNOSIS — Z86010 Personal history of colonic polyps: Secondary | ICD-10-CM

## 2021-11-20 MED ORDER — NA SULFATE-K SULFATE-MG SULF 17.5-3.13-1.6 GM/177ML PO SOLN: 1.0000 | Freq: Once | ORAL | 0 refills | Status: AC

## 2021-11-20 NOTE — Progress Notes (Signed)
No egg or soy allergy known to patient  No issues known to pt with past sedation with any surgeries or procedures Patient denies ever being told they had issues or difficulty with intubation  No FH of Malignant Hyperthermia Pt is not on diet pills Pt is not on  home 02  Pt is not on blood thinners  Pt denies issues with constipation  No A fib or A flutter  Pt is fully vaccinated  for Covid   NO PA's for preps discussed with pt In PV today  Discussed with pt there will be an out-of-pocket cost for prep and that varies from $0 to 70 +  dollars - pt verbalized understanding   Due to the COVID-19 pandemic we are asking patients to follow certain guidelines in PV and the [facility]   Pt aware of COVID protocols and [facility] guidelines   PV completed over the phone. Pt verified name, DOB, address and insurance during PV today.  Pt mailed instruction packet with copy of consent form to read and not return, and instructions.  Pt encouraged to call with questions or issues.  If pt has My chart, procedure instructions sent via My Chart

## 2021-12-04 ENCOUNTER — Other Ambulatory Visit: Payer: Self-pay

## 2021-12-04 ENCOUNTER — Encounter: Payer: Self-pay | Admitting: Internal Medicine

## 2021-12-04 ENCOUNTER — Ambulatory Visit (AMBULATORY_SURGERY_CENTER): Payer: 59 | Admitting: Internal Medicine

## 2021-12-04 VITALS — BP 108/69 | HR 69 | Temp 97.6°F | Resp 17 | Ht 68.0 in | Wt 185.0 lb

## 2021-12-04 DIAGNOSIS — Z86010 Personal history of colonic polyps: Secondary | ICD-10-CM

## 2021-12-04 DIAGNOSIS — K635 Polyp of colon: Secondary | ICD-10-CM | POA: Diagnosis not present

## 2021-12-04 DIAGNOSIS — D122 Benign neoplasm of ascending colon: Secondary | ICD-10-CM

## 2021-12-04 MED ORDER — SODIUM CHLORIDE 0.9 % IV SOLN: 500.0000 mL | Freq: Once | INTRAVENOUS | Status: DC

## 2021-12-04 NOTE — Op Note (Signed)
[facility] Patient Name: Milo Solana Procedure Date: 12/04/2021 10:57 AM MRN: [id number] Endoscopist: Docia Chuck. Henrene Pastor , MD Age: 66 Referring MD:  Date of Birth: [birth year] Gender: Female Account #: [id number] Procedure:                Colonoscopy with cold snare polypectomy x 1 Indications:              High risk colon cancer surveillance: Personal                            history of multiple (3 or more) sessile serrated                            colon polyps (less than 10 mm in size) with no                            dysplasia. Prior examinations 2012, 2017 Medicines:                Monitored Anesthesia Care Procedure:                Pre-Anesthesia Assessment:                           - Prior to the procedure, a History and Physical                            was performed, and patient medications and                            allergies were reviewed. The patient's tolerance of                            previous anesthesia was also reviewed. The risks                            and benefits of the procedure and the sedation                            options and risks were discussed with the patient.                            All questions were answered, and informed consent                            was obtained. Prior Anticoagulants: The patient has                            taken no previous anticoagulant or antiplatelet                            agents. ASA Grade Assessment: II - A patient with                            mild systemic disease. After reviewing the risks  and benefits, the patient was deemed in                            satisfactory condition to undergo the procedure.                           After obtaining informed consent, the colonoscope                            was passed under direct vision. Throughout the                            procedure, the patient's blood pressure, pulse, and                             oxygen saturations were monitored continuously. The                            Olympus CF-HQ190L [id number]) Colonoscope was                            introduced through the anus and advanced to the the                            cecum, identified by appendiceal orifice and                            ileocecal valve. The ileocecal valve, appendiceal                            orifice, and rectum were photographed. The quality                            of the bowel preparation was excellent. The                            colonoscopy was performed without difficulty. The                            patient tolerated the procedure well. The bowel                            preparation used was Prepopik via split dose                            instruction. Scope In: 11:02:09 AM Scope Out: 11:17:16 AM Scope Withdrawal Time: 0 hours 12 minutes 6 seconds  Total Procedure Duration: 0 hours 15 minutes 7 seconds  Findings:                 A 8 mm polyp was found in the ascending colon. The                            polyp was sessile. The polyp was removed with a  cold snare. Resection and retrieval were complete.                           Multiple diverticula were found in the sigmoid                            colon.                           The exam was otherwise without abnormality on                            direct and retroflexion views. Complications:            No immediate complications. Estimated blood loss:                            None. Estimated Blood Loss:     Estimated blood loss: none. Impression:               - One 8 mm polyp in the ascending colon, removed                            with a cold snare. Resected and retrieved.                           - Diverticulosis in the sigmoid colon.                           - The examination was otherwise normal on direct                            and retroflexion views. Recommendation:           -  Repeat colonoscopy in 5 years for surveillance.                           - Patient has a contact number available for                            emergencies. The signs and symptoms of potential                            delayed complications were discussed with the                            patient. Return to normal activities tomorrow.                            Written discharge instructions were provided to the                            patient.                           - Resume previous diet.                           -  Continue present medications.                           - Await pathology results. Docia Chuck. Henrene Pastor, MD 12/04/2021 11:23:45 AM This report has been signed electronically.

## 2021-12-04 NOTE — Progress Notes (Signed)
Called to room to assist during endoscopic procedure.  Patient ID and intended procedure confirmed with present staff. Received instructions for my participation in the procedure from the performing physician.  

## 2021-12-04 NOTE — Progress Notes (Signed)
HISTORY OF PRESENT ILLNESS:  Nancy Steele is a 66 y.o. female with a history of multiple sessile serrated polyps who presents today for surveillance colonoscopy.  Last examination 2017.  No active complaints.  Tolerated prep well.  REVIEW OF SYSTEMS:  All non-GI ROS negative. Past Medical History:  Diagnosis Date   Allergy    seasonal   Arthritis    Breast cyst 03/22/2012   right breast benign   Breast cyst 2006   left breast benign   Cataract    removed left eye, very small right   DDD (degenerative disc disease), cervical    Family history of adverse reaction to anesthesia    brother slow to wake up   Glaucoma, left eye    Hyperlipidemia    past hx- no meds   Hypertension    OA (osteoarthritis)    knees   Seasonal allergies    Wears glasses     Past Surgical History:  Procedure Laterality Date   CARPAL TUNNEL RELEASE Bilateral 2014   CATARACT EXTRACTION W/ INTRAOCULAR LENS IMPLANT Left 2014   CERVICAL FUSION  2000   C5 -- C6   CHONDROPLASTY Left 10/17/2014   Procedure: CHONDROPLASTY;  Surgeon: Lorn Junes, MD;  Location: [place];  Service: Orthopedics;  Laterality: Left;   COLONOSCOPY     KNEE ARTHROSCOPY Bilateral left , prior to 2015;  right 2009 approx.   KNEE ARTHROSCOPY WITH LATERAL MENISECTOMY Left 10/17/2014   Procedure: KNEE ARTHROSCOPY WITH LATERAL MENISECTOMY;  Surgeon: Lorn Junes, MD;  Location: [place];  Service: Orthopedics;  Laterality: Left;   KNEE ARTHROSCOPY WITH MEDIAL MENISECTOMY Left 10/17/2014   Procedure: LEFT KNEE ARTHROSCOPY WITH MEDIAL MENISCECTOMY;  Surgeon: Lorn Junes, MD;  Location: [place];  Service: Orthopedics;  Laterality: Left;   POLYPECTOMY     TOTAL KNEE ARTHROPLASTY Left 12/06/2017   Procedure: LEFT TOTAL KNEE ARTHROPLASTY;  Surgeon: Elsie Saas, MD;  Location: [place];  Service: Orthopedics;  Laterality: Left;   TOTAL KNEE ARTHROPLASTY Right 12/05/2018    Procedure: RIGHT TOTAL KNEE ARTHROPLASTY;  Surgeon: Elsie Saas, MD;  Location: [facility];  Service: Orthopedics;  Laterality: Right;    Social History SOLEIL MAS  reports that she has never smoked. She has never used smokeless tobacco. She reports that she does not currently use alcohol. She reports that she does not use drugs.  family history includes Arthritis in an other family member; Breast cancer in her maternal aunt; Diabetes in an other family member; Hypertension in an other family member; Kidney cancer in her father.  Allergies  Allergen Reactions   Penicillins Swelling, Rash and Other (See Comments)    #####Patient has had Ancef for multiple surgeries without a reaction######   SWELLING OF THE THROAT, OTHER CHILDHOOD REACTION PCN REACTION WITH IMMEDIATE RASH, FACIAL/TONGUE/THROAT SWELLING, SOB, OR LIGHTHEADEDNESS WITH HYPOTENSION:  #  #  #  YES  #  #  #  Has patient had a PCN reaction causing severe rash involving mucus membranes or skin necrosis: Unknown Has patient had a PCN reaction that required hospitalization: No Has patient had a PCN reaction occurring within the last 10 years: No       Sulfa Antibiotics Nausea And Vomiting   Sulfasalazine Nausea And Vomiting   Vancomycin Hives       PHYSICAL EXAMINATION:  Vital signs: BP 102/67    Pulse 70    Temp 97.6 F (36.4 C)  Ht 5\' 8"  (1.727 m)    Wt 185 lb (83.9 kg)    SpO2 97%    BMI 28.13 kg/m  General: Well-developed, well-nourished, no acute distress HEENT: Sclerae are anicteric, conjunctiva pink. Oral mucosa intact Lungs: Clear Heart: Regular Abdomen: soft, nontender, nondistended, no obvious ascites, no peritoneal signs, normal bowel sounds. No organomegaly. Extremities: No edema Psychiatric: alert and oriented x3. Cooperative     ASSESSMENT:  History of sessile serrated polyps.   PLAN:   Surveillance colonoscopy

## 2021-12-04 NOTE — Progress Notes (Signed)
PT taken to PACU. Monitors in place. VSS. Report given to RN. 

## 2021-12-04 NOTE — Progress Notes (Signed)
D.T. vital signs. °

## 2021-12-04 NOTE — Patient Instructions (Signed)

## 2021-12-04 NOTE — Progress Notes (Signed)
Pt's states no medical or surgical changes since previsit or office visit. 

## 2021-12-08 ENCOUNTER — Encounter: Payer: Self-pay | Admitting: Internal Medicine

## 2021-12-09 ENCOUNTER — Telehealth: Payer: Self-pay | Admitting: *Deleted

## 2021-12-09 NOTE — Telephone Encounter (Signed)
°  Follow up Call-  Call back number 12/04/2021  Post procedure Call Back phone  # [phone]  Permission to leave phone message Yes  Some recent data might be hidden     Patient questions:  Do you have a fever, pain , or abdominal swelling? No. Pain Score  0 *  Have you tolerated food without any problems? Yes.    Have you been able to return to your normal activities? Yes.    Do you have any questions about your discharge instructions: Diet   No. Medications  No. Follow up visit  No.  Do you have questions or concerns about your Care? No.  Actions: * If pain score is 4 or above: No action needed, pain <4.

## 2021-12-29 ENCOUNTER — Other Ambulatory Visit: Payer: Self-pay

## 2021-12-29 ENCOUNTER — Other Ambulatory Visit: Payer: 59

## 2021-12-29 DIAGNOSIS — Z0000 Encounter for general adult medical examination without abnormal findings: Secondary | ICD-10-CM

## 2021-12-29 DIAGNOSIS — I10 Essential (primary) hypertension: Secondary | ICD-10-CM

## 2021-12-29 DIAGNOSIS — Z136 Encounter for screening for cardiovascular disorders: Secondary | ICD-10-CM

## 2021-12-29 LAB — CBC WITH DIFFERENTIAL/PLATELET
Absolute Monocytes: 367 cells/uL (ref 200–950)
Basophils Absolute: 61 cells/uL (ref 0–200)
Basophils Relative: 1.2 %
Eosinophils Absolute: 296 cells/uL (ref 15–500)
Eosinophils Relative: 5.8 %
HCT: 39.3 % (ref 35.0–45.0)
Hemoglobin: 13 g/dL (ref 11.7–15.5)
Lymphs Abs: 1938 cells/uL (ref 850–3900)
MCH: 30.5 pg (ref 27.0–33.0)
MCHC: 33.1 g/dL (ref 32.0–36.0)
MCV: 92.3 fL (ref 80.0–100.0)
MPV: 11.6 fL (ref 7.5–12.5)
Monocytes Relative: 7.2 %
Neutro Abs: 2438 cells/uL (ref 1500–7800)
Neutrophils Relative %: 47.8 %
Platelets: 236 10*3/uL (ref 140–400)
RBC: 4.26 10*6/uL (ref 3.80–5.10)
RDW: 11.8 % (ref 11.0–15.0)
Total Lymphocyte: 38 %
WBC: 5.1 10*3/uL (ref 3.8–10.8)

## 2021-12-29 LAB — COMPREHENSIVE METABOLIC PANEL
AG Ratio: 1.9 (calc) (ref 1.0–2.5)
ALT: 15 U/L (ref 6–29)
AST: 14 U/L (ref 10–35)
Albumin: 4.2 g/dL (ref 3.6–5.1)
Alkaline phosphatase (APISO): 69 U/L (ref 37–153)
BUN: 19 mg/dL (ref 7–25)
CO2: 29 mmol/L (ref 20–32)
Calcium: 9.1 mg/dL (ref 8.6–10.4)
Chloride: 105 mmol/L (ref 98–110)
Creat: 0.9 mg/dL (ref 0.50–1.05)
Globulin: 2.2 g/dL (calc) (ref 1.9–3.7)
Glucose, Bld: 94 mg/dL (ref 65–99)
Potassium: 4.6 mmol/L (ref 3.5–5.3)
Sodium: 141 mmol/L (ref 135–146)
Total Bilirubin: 0.7 mg/dL (ref 0.2–1.2)
Total Protein: 6.4 g/dL (ref 6.1–8.1)

## 2021-12-29 LAB — LIPID PANEL
Cholesterol: 207 mg/dL — ABNORMAL HIGH (ref <200)
HDL: 68 mg/dL (ref >=50)
LDL Cholesterol (Calc): 119 mg/dL (calc) — ABNORMAL HIGH
Non-HDL Cholesterol (Calc): 139 mg/dL (calc) — ABNORMAL HIGH (ref <130)
Total CHOL/HDL Ratio: 3 (calc) (ref <5.0)
Triglycerides: 94 mg/dL (ref <150)

## 2022-01-01 ENCOUNTER — Other Ambulatory Visit: Payer: Self-pay

## 2022-01-01 ENCOUNTER — Ambulatory Visit (INDEPENDENT_AMBULATORY_CARE_PROVIDER_SITE_OTHER): Payer: 59 | Admitting: Family Medicine

## 2022-01-01 ENCOUNTER — Encounter: Payer: Self-pay | Admitting: Family Medicine

## 2022-01-01 VITALS — BP 118/82 | HR 76 | Temp 97.7°F | Resp 18 | Ht 68.0 in | Wt 190.0 lb

## 2022-01-01 DIAGNOSIS — Z0001 Encounter for general adult medical examination with abnormal findings: Secondary | ICD-10-CM | POA: Diagnosis not present

## 2022-01-01 DIAGNOSIS — Z96652 Presence of left artificial knee joint: Secondary | ICD-10-CM | POA: Diagnosis not present

## 2022-01-01 DIAGNOSIS — I10 Essential (primary) hypertension: Secondary | ICD-10-CM

## 2022-01-01 DIAGNOSIS — H4010X0 Unspecified open-angle glaucoma, stage unspecified: Secondary | ICD-10-CM

## 2022-01-01 MED ORDER — MONTELUKAST SODIUM 10 MG PO TABS: 10.0000 mg | ORAL_TABLET | Freq: Every day | ORAL | 3 refills | Status: DC

## 2022-01-01 NOTE — Progress Notes (Signed)
? ?Subjective:  ? ? Patient ID: Nancy Steele, female    DOB: Mar 12, 1956, 66 y.o.   MRN: [id number] ? ?HPI ?Patient is a very pleasant 66 year old Caucasian female who presents today for complete physical exam.  She has been taking Wegovy for weight loss and having success with this.  She is getting this prescribed by her gynecologist who is also doing her Pap smears.  She is yet to have a bone density test.  She is due for Shingrix.  She is also due for COVID booster.  She reports 6 months of sinus pressure and pain.  Usually worse first thing in the mornings and goes away throughout the day.  She had to stop the Flonase due to irritation from the [facility] and switch to loratadine but this is still not controlling sinus inflammation. ?Mammogram 08/2021-normal ?Colonoscopy 11/2021- one polyp, q 5 years ? ?Immunization History  ?Administered Date(s) Administered  ? Influenza,inj,Quad PF,6+ Mos 09/28/2018, 07/06/2019  ? Influenza-Unspecified 07/28/2016, 08/11/2017, 10/20/2017, 09/16/2018  ? Tdap 09/08/2017  ? ?Lab on 12/29/2021  ?Component Date Value Ref Range Status  ? Cholesterol 12/29/2021 207 (H)  <200 mg/dL Final  ? HDL 12/29/2021 68  > OR = 50 mg/dL Final  ? Triglycerides 12/29/2021 94  <150 mg/dL Final  ? LDL Cholesterol (Calc) 12/29/2021 119 (H)  mg/dL (calc) Final  ? Comment: Reference range: <100 ?Marland Kitchen ?Desirable range <100 mg/dL for primary prevention;   ?<70 mg/dL for patients with CHD or diabetic patients  ?with > or = 2 CHD risk factors. ?. ?LDL-C is now calculated using the Martin-Hopkins  ?calculation, which is a validated novel method providing  ?better accuracy than the Friedewald equation in the  ?estimation of LDL-C.  ?Cresenciano Genre et al. Annamaria Helling. 9628;366(29): 2061-2068  ?(http://education.[facility].com/faq/FAQ164) ?  ? Total CHOL/HDL Ratio 12/29/2021 3.0  <5.0 (calc) Final  ? Non-HDL Cholesterol (Calc) 12/29/2021 139 (H)  <130 mg/dL (calc) Final  ? Comment: For patients with diabetes plus 1 major  ASCVD risk  ?factor, treating to a non-HDL-C goal of <100 mg/dL  ?(LDL-C of <70 mg/dL) is considered a therapeutic  ?option. ?  ? WBC 12/29/2021 5.1  3.8 - 10.8 Thousand/uL Final  ? RBC 12/29/2021 4.26  3.80 - 5.10 Million/uL Final  ? Hemoglobin 12/29/2021 13.0  11.7 - 15.5 g/dL Final  ? HCT 12/29/2021 39.3  35.0 - 45.0 % Final  ? MCV 12/29/2021 92.3  80.0 - 100.0 fL Final  ? MCH 12/29/2021 30.5  27.0 - 33.0 pg Final  ? MCHC 12/29/2021 33.1  32.0 - 36.0 g/dL Final  ? RDW 12/29/2021 11.8  11.0 - 15.0 % Final  ? Platelets 12/29/2021 236  140 - 400 Thousand/uL Final  ? MPV 12/29/2021 11.6  7.5 - 12.5 fL Final  ? Neutro Abs 12/29/2021 2,438  1,500 - 7,800 cells/uL Final  ? Lymphs Abs 12/29/2021 1,938  850 - 3,900 cells/uL Final  ? Absolute Monocytes 12/29/2021 367  200 - 950 cells/uL Final  ? Eosinophils Absolute 12/29/2021 296  15 - 500 cells/uL Final  ? Basophils Absolute 12/29/2021 61  0 - 200 cells/uL Final  ? Neutrophils Relative % 12/29/2021 47.8  % Final  ? Total Lymphocyte 12/29/2021 38.0  % Final  ? Monocytes Relative 12/29/2021 7.2  % Final  ? Eosinophils Relative 12/29/2021 5.8  % Final  ? Basophils Relative 12/29/2021 1.2  % Final  ? Glucose, Bld 12/29/2021 94  65 - 99 mg/dL Final  ? Comment: . ?  Fasting reference interval ?. ?  ? BUN 12/29/2021 19  7 - 25 mg/dL Final  ? Creat 12/29/2021 0.90  0.50 - 1.05 mg/dL Final  ? BUN/Creatinine Ratio 40/98/1191 NOT APPLICABLE  6 - 22 (calc) Final  ? Sodium 12/29/2021 141  135 - 146 mmol/L Final  ? Potassium 12/29/2021 4.6  3.5 - 5.3 mmol/L Final  ? Chloride 12/29/2021 105  98 - 110 mmol/L Final  ? CO2 12/29/2021 29  20 - 32 mmol/L Final  ? Calcium 12/29/2021 9.1  8.6 - 10.4 mg/dL Final  ? Total Protein 12/29/2021 6.4  6.1 - 8.1 g/dL Final  ? Albumin 12/29/2021 4.2  3.6 - 5.1 g/dL Final  ? Globulin 12/29/2021 2.2  1.9 - 3.7 g/dL (calc) Final  ? AG Ratio 12/29/2021 1.9  1.0 - 2.5 (calc) Final  ? Total Bilirubin 12/29/2021 0.7  0.2 - 1.2 mg/dL Final  ? Alkaline  phosphatase (APISO) 12/29/2021 69  37 - 153 U/L Final  ? AST 12/29/2021 14  10 - 35 U/L Final  ? ALT 12/29/2021 15  6 - 29 U/L Final  ? ? ? ?Past Medical History:  ?Diagnosis Date  ? Allergy   ? seasonal  ? Arthritis   ? Breast cyst 03/22/2012  ? right breast benign  ? Breast cyst 2006  ? left breast benign  ? Cataract   ? removed left eye, very small right  ? DDD (degenerative disc disease), cervical   ? Family history of adverse reaction to anesthesia   ? brother slow to wake up  ? Glaucoma, left eye   ? Hyperlipidemia   ? past hx- no meds  ? Hypertension   ? OA (osteoarthritis)   ? knees  ? Seasonal allergies   ? Wears glasses   ? ?Past Surgical History:  ?Procedure Laterality Date  ? CARPAL TUNNEL RELEASE Bilateral 2014  ? CATARACT EXTRACTION W/ INTRAOCULAR LENS IMPLANT Left 2014  ? CERVICAL FUSION  2000  ? C5 -- C6  ? CHONDROPLASTY Left 10/17/2014  ? Procedure: CHONDROPLASTY;  Surgeon: Lorn Junes, MD;  Location: [place];  Service: Orthopedics;  Laterality: Left;  ? COLONOSCOPY    ? KNEE ARTHROSCOPY Bilateral left , prior to 2015;  right 2009 approx.  ? KNEE ARTHROSCOPY WITH LATERAL MENISECTOMY Left 10/17/2014  ? Procedure: KNEE ARTHROSCOPY WITH LATERAL MENISECTOMY;  Surgeon: Lorn Junes, MD;  Location: [place];  Service: Orthopedics;  Laterality: Left;  ? KNEE ARTHROSCOPY WITH MEDIAL MENISECTOMY Left 10/17/2014  ? Procedure: LEFT KNEE ARTHROSCOPY WITH MEDIAL MENISCECTOMY;  Surgeon: Lorn Junes, MD;  Location: [place];  Service: Orthopedics;  Laterality: Left;  ? POLYPECTOMY    ? TOTAL KNEE ARTHROPLASTY Left 12/06/2017  ? Procedure: LEFT TOTAL KNEE ARTHROPLASTY;  Surgeon: Elsie Saas, MD;  Location: [place];  Service: Orthopedics;  Laterality: Left;  ? TOTAL KNEE ARTHROPLASTY Right 12/05/2018  ? Procedure: RIGHT TOTAL KNEE ARTHROPLASTY;  Surgeon: Elsie Saas, MD;  Location: [facility];  Service: Orthopedics;  Laterality: Right;  ? ?Current  Outpatient Medications on File Prior to Visit  ?Medication Sig Dispense Refill  ? COMBIGAN 0.2-0.5 % ophthalmic solution Place 1 drop into the left eye 2 (two) times daily.   3  ? Cyanocobalamin (VITAMIN B 12 PO) Take by mouth.    ? fluticasone (FLONASE) 50 MCG/ACT nasal spray Place into both nostrils as needed for allergies or rhinitis.    ? ketoconazole (NIZORAL) 2 % cream Apply  topically.    ? latanoprost (XALATAN) 0.005 % ophthalmic solution Place 1 drop into the left eye at bedtime.    ? lisinopril (ZESTRIL) 10 MG tablet TAKE 1 TABLET BY MOUTH EVERY DAY (Patient not taking: Reported on 11/20/2021) 90 tablet 1  ? Multiple Vitamin (MULTIVITAMIN) tablet Take 1 tablet by mouth daily.    ? WEGOVY 2.4 MG/0.75ML SOAJ Inject into the skin.    ? ?No current facility-administered medications on file prior to visit.  ? ?Allergies  ?Allergen Reactions  ? Penicillins Swelling, Rash and Other (See Comments)  ?  #####Patient has had Ancef for multiple surgeries without a reaction######   SWELLING OF THE THROAT, OTHER ?CHILDHOOD REACTION ?PCN REACTION WITH IMMEDIATE RASH, FACIAL/TONGUE/THROAT SWELLING, SOB, OR LIGHTHEADEDNESS WITH HYPOTENSION:  #  #  #  YES  #  #  #  ?Has patient had a PCN reaction causing severe rash involving mucus membranes or skin necrosis: Unknown ?Has patient had a PCN reaction that required hospitalization: No ?Has patient had a PCN reaction occurring within the last 10 years: No ? ?  ?  ? Sulfa Antibiotics Nausea And Vomiting  ? Sulfasalazine Nausea And Vomiting  ? Vancomycin Hives  ? ?Social History  ? ?Socioeconomic History  ? Marital status: Married  ?  Spouse name: Not on file  ? Number of children: Not on file  ? Years of education: Not on file  ? Highest education level: Not on file  ?Occupational History  ? Not on file  ?Tobacco Use  ? Smoking status: Never  ? Smokeless tobacco: Never  ?Vaping Use  ? Vaping Use: Never used  ?Substance and Sexual Activity  ? Alcohol use: Not Currently  ? Drug use:  Never  ? Sexual activity: Not on file  ?Other Topics Concern  ? Not on file  ?Social History Narrative  ? Not on file  ? ?Social Determinants of Health  ? ?Financial Resource Strain: Not on file  ?Food Insecu

## 2022-03-26 ENCOUNTER — Other Ambulatory Visit: Payer: Self-pay | Admitting: Family Medicine

## 2022-10-04 ENCOUNTER — Ambulatory Visit
Admission: EM | Admit: 2022-10-04 | Discharge: 2022-10-04 | Disposition: A | Discharge status: Home / Self Care | Payer: 59 | Attending: Family Medicine | Admitting: Family Medicine

## 2022-10-04 DIAGNOSIS — R062 Wheezing: Secondary | ICD-10-CM

## 2022-10-04 DIAGNOSIS — J0100 Acute maxillary sinusitis, unspecified: Secondary | ICD-10-CM

## 2022-10-04 MED ORDER — AZITHROMYCIN 250 MG PO TABS: ORAL_TABLET | ORAL | 0 refills | Status: DC

## 2022-10-04 MED ORDER — PROMETHAZINE-DM 6.25-15 MG/5ML PO SYRP: 5.0000 mL | ORAL_SOLUTION | Freq: Four times a day (QID) | ORAL | 0 refills | Status: DC | PRN

## 2022-10-04 MED ORDER — ALBUTEROL SULFATE HFA 108 (90 BASE) MCG/ACT IN AERS: 2.0000 | INHALATION_SPRAY | RESPIRATORY_TRACT | 0 refills | Status: DC | PRN

## 2022-10-04 NOTE — ED Provider Notes (Signed)
[facility]    CSN: [id number] Arrival date & time: 10/04/22  2956      History   Chief Complaint Chief Complaint  Patient presents with   Cough    Sinus headache feels like moving into chest. General not feeling well. - Entered by patient    HPI Nancy Steele is a 66 y.o. female.   Patient presenting today with 3-week history of progressively worsening sore throat, coughing, nasal congestion, facial pain and pressure and now wheezing, chest tightness additionally.  Denies fever, chills, chest pain, shortness of breath, abdominal pain, nausea vomiting or diarrhea.  Taking Mucinex, cold and sinus medication, Flonase, Tylenol with minimal relief.  History of seasonal allergies on as needed medications.    Past Medical History:  Diagnosis Date   Allergy    seasonal   Arthritis    Breast cyst 03/22/2012   right breast benign   Breast cyst 2006   left breast benign   Cataract    removed left eye, very small right   DDD (degenerative disc disease), cervical    Family history of adverse reaction to anesthesia    brother slow to wake up   Glaucoma, left eye    Hyperlipidemia    past hx- no meds   Hypertension    OA (osteoarthritis)    knees   Seasonal allergies    Wears glasses     Patient Active Problem List   Diagnosis Date Noted   S/P total knee arthroplasty, left 11/23/2018   Glaucoma 11/23/2018   Primary localized osteoarthritis of right knee 12/06/2017   Primary localized osteoarthritis of left knee 11/24/2017   Acute medial meniscus tear of left knee    Hypertension     Past Surgical History:  Procedure Laterality Date   CARPAL TUNNEL RELEASE Bilateral 2014   CATARACT EXTRACTION W/ INTRAOCULAR LENS IMPLANT Left 2014   CERVICAL FUSION  2000   C5 -- C6   CHONDROPLASTY Left 10/17/2014   Procedure: CHONDROPLASTY;  Surgeon: Lorn Junes, MD;  Location: [place];  Service: Orthopedics;  Laterality: Left;   COLONOSCOPY      KNEE ARTHROSCOPY Bilateral left , prior to 2015;  right 2009 approx.   KNEE ARTHROSCOPY WITH LATERAL MENISECTOMY Left 10/17/2014   Procedure: KNEE ARTHROSCOPY WITH LATERAL MENISECTOMY;  Surgeon: Lorn Junes, MD;  Location: [place];  Service: Orthopedics;  Laterality: Left;   KNEE ARTHROSCOPY WITH MEDIAL MENISECTOMY Left 10/17/2014   Procedure: LEFT KNEE ARTHROSCOPY WITH MEDIAL MENISCECTOMY;  Surgeon: Lorn Junes, MD;  Location: [place];  Service: Orthopedics;  Laterality: Left;   POLYPECTOMY     TOTAL KNEE ARTHROPLASTY Left 12/06/2017   Procedure: LEFT TOTAL KNEE ARTHROPLASTY;  Surgeon: Elsie Saas, MD;  Location: [place];  Service: Orthopedics;  Laterality: Left;   TOTAL KNEE ARTHROPLASTY Right 12/05/2018   Procedure: RIGHT TOTAL KNEE ARTHROPLASTY;  Surgeon: Elsie Saas, MD;  Location: [facility];  Service: Orthopedics;  Laterality: Right;    OB History   No obstetric history on file.      Home Medications    Prior to Admission medications   Medication Sig Start Date End Date Taking? Authorizing Provider  albuterol (VENTOLIN HFA) 108 (90 Base) MCG/ACT inhaler Inhale 2 puffs into the lungs every 4 (four) hours as needed for wheezing or shortness of breath. 10/04/22  Yes Volney American, PA-C  azithromycin (ZITHROMAX) 250 MG tablet Take first 2 tablets together, then 1  every day until finished. 10/04/22  Yes Volney American, PA-C  promethazine-dextromethorphan (PROMETHAZINE-DM) 6.25-15 MG/5ML syrup Take 5 mLs by mouth 4 (four) times daily as needed. 10/04/22  Yes Volney American, PA-C  COMBIGAN 0.2-0.5 % ophthalmic solution Place 1 drop into the left eye 2 (two) times daily.  10/24/18   Susy Frizzle, MD  Cyanocobalamin (VITAMIN B 12 PO) Take by mouth.    [provider]  fluticasone (FLONASE) 50 MCG/ACT nasal spray Place into both nostrils as needed for allergies or rhinitis.    [provider]   ketoconazole (NIZORAL) 2 % cream Apply topically. 07/30/21   [provider]  latanoprost (XALATAN) 0.005 % ophthalmic solution Place 1 drop into the left eye at bedtime. 06/25/21   [provider]  lisinopril (ZESTRIL) 10 MG tablet TAKE 1 TABLET BY MOUTH EVERY DAY Patient not taking: Reported on 11/20/2021 11/25/20   Susy Frizzle, MD  montelukast (SINGULAIR) 10 MG tablet TAKE 1 TABLET BY MOUTH EVERYDAY AT BEDTIME 03/26/22   Susy Frizzle, MD  Multiple Vitamin (MULTIVITAMIN) tablet Take 1 tablet by mouth daily.    [provider]  WEGOVY 2.4 MG/0.75ML SOAJ Inject into the skin. 11/18/21   [provider]    Family History Family History  Problem Relation Age of Onset   Diabetes Other    Hypertension Other    Arthritis Other    Kidney cancer Father    Breast cancer Maternal Aunt    Colon cancer Neg Hx    Colon polyps Neg Hx    Esophageal cancer Neg Hx    Rectal cancer Neg Hx    Stomach cancer Neg Hx     Social History Social History   Tobacco Use   Smoking status: Never   Smokeless tobacco: Never  Vaping Use   Vaping Use: Never used  Substance Use Topics   Alcohol use: Not Currently   Drug use: Never     Allergies   Penicillins, Sulfa antibiotics, Sulfasalazine, and Vancomycin   Review of Systems Review of Systems Per HPI  Physical Exam Triage Vital Signs ED Triage Vitals  Enc Vitals Group     BP 10/04/22 1129 131/80     Pulse Rate 10/04/22 1129 78     Resp 10/04/22 1129 20     Temp 10/04/22 1129 98.5 F (36.9 C)     Temp Source 10/04/22 1129 Oral     SpO2 10/04/22 1129 96 %     Weight --      Height --      Head Circumference --      Peak Flow --      Pain Score 10/04/22 1131 5     Pain Loc --      Pain Edu? --      Excl. in [place]? --    No data found.  Updated Vital Signs BP 131/80 (BP Location: Right Arm)   Pulse 78   Temp 98.5 F (36.9 C) (Oral)   Resp 20   SpO2 96%   Visual Acuity Right Eye Distance:    Left Eye Distance:   Bilateral Distance:    Right Eye Near:   Left Eye Near:    Bilateral Near:     Physical Exam Vitals and nursing note reviewed.  Constitutional:      Appearance: Normal appearance.  HENT:     Head: Atraumatic.     Right Ear: Tympanic membrane and external ear normal.  Left Ear: Tympanic membrane and external ear normal.     Nose: Rhinorrhea present.     Mouth/Throat:     Mouth: Mucous membranes are moist.     Pharynx: Posterior oropharyngeal erythema present.  Eyes:     Extraocular Movements: Extraocular movements intact.     Conjunctiva/sclera: Conjunctivae normal.  Cardiovascular:     Rate and Rhythm: Normal rate and regular rhythm.     Heart sounds: Normal heart sounds.  Pulmonary:     Effort: Pulmonary effort is normal.     Breath sounds: Normal breath sounds. No wheezing or rales.  Musculoskeletal:        General: Normal range of motion.     Cervical back: Normal range of motion and neck supple.  Skin:    General: Skin is warm and dry.  Neurological:     Mental Status: She is alert and oriented to person, place, and time.  Psychiatric:        Mood and Affect: Mood normal.        Thought Content: Thought content normal.      UC Treatments / Results  Labs (all labs ordered are listed, but only abnormal results are displayed) Labs Reviewed - No data to display  EKG   Radiology No results found.  Procedures Procedures (including critical care time)  Medications Ordered in UC Medications - No data to display  Initial Impression / Assessment and Plan / UC Course  I have reviewed the triage vital signs and the nursing notes.  Pertinent labs & imaging results that were available during my care of the patient were reviewed by me and considered in my medical decision making (see chart for details).     Vitals reassuring today, exam showing evidence of a sinus infection and bronchitis.  Treat with azithromycin, Phenergan DM,  albuterol, continued Flonase and Mucinex over-the-counter.  Return for worsening symptoms.  Final Clinical Impressions(s) / UC Diagnoses   Final diagnoses:  Acute non-recurrent maxillary sinusitis  Wheezing   Discharge Instructions   None    ED Prescriptions     Medication Sig Dispense Auth. Provider   azithromycin (ZITHROMAX) 250 MG tablet Take first 2 tablets together, then 1 every day until finished. 6 tablet Volney American, [place]   promethazine-dextromethorphan (PROMETHAZINE-DM) 6.25-15 MG/5ML syrup Take 5 mLs by mouth 4 (four) times daily as needed. 100 mL Volney American, PA-C   albuterol (VENTOLIN HFA) 108 (90 Base) MCG/ACT inhaler Inhale 2 puffs into the lungs every 4 (four) hours as needed for wheezing or shortness of breath. 18 g Volney American, [place]      PDMP not reviewed this encounter.   Volney American, [place] 10/04/22 1202

## 2022-10-04 NOTE — ED Triage Notes (Signed)
Pt reports x 3 weeks she has had sore throat, coughing a lot, and sinus problem. Took tylenol, mucinex, and nyquil but no relief.

## 2022-10-21 ENCOUNTER — Telehealth: Payer: 59 | Admitting: Nurse Practitioner

## 2022-10-21 DIAGNOSIS — J0140 Acute pansinusitis, unspecified: Secondary | ICD-10-CM

## 2022-10-21 MED ORDER — DOXYCYCLINE HYCLATE 100 MG PO TABS: 100.0000 mg | ORAL_TABLET | Freq: Two times a day (BID) | ORAL | 0 refills | Status: AC

## 2022-10-21 NOTE — Progress Notes (Signed)
E-Visit for Sinus Problems  We are sorry that you are not feeling well.  Here is how we plan to help!  Based on what you have shared with me it looks like you have sinusitis.  Sinusitis is inflammation and infection in the sinus cavities of the head.  Based on your presentation I believe you most likely have Acute Bacterial Sinusitis.  This is an infection caused by bacteria and is treated with antibiotics. I have prescribed Doxycycline 100mg by mouth twice a day for 10 days. You may use an oral decongestant such as Mucinex D or if you have glaucoma or high blood pressure use plain Mucinex. Saline nasal spray help and can safely be used as often as needed for congestion.  If you develop worsening sinus pain, fever or notice severe headache and vision changes, or if symptoms are not better after completion of antibiotic, please schedule an appointment with a health care provider.    Sinus infections are not as easily transmitted as other respiratory infection, however we still recommend that you avoid close contact with loved ones, especially the very young and elderly.  Remember to wash your hands thoroughly throughout the day as this is the number one way to prevent the spread of infection!  Home Care: Only take medications as instructed by your medical team. Complete the entire course of an antibiotic. Do not take these medications with alcohol. A steam or ultrasonic humidifier can help congestion.  You can place a towel over your head and breathe in the steam from hot water coming from a faucet. Avoid close contacts especially the very young and the elderly. Cover your mouth when you cough or sneeze. Always remember to wash your hands.  Get Help Right Away If: You develop worsening fever or sinus pain. You develop a severe head ache or visual changes. Your symptoms persist after you have completed your treatment plan.  Make sure you Understand these instructions. Will watch your  condition. Will get help right away if you are not doing well or get worse.  Thank you for choosing an e-visit.  Your e-visit answers were reviewed by a board certified advanced clinical practitioner to complete your personal care plan. Depending upon the condition, your plan could have included both over the counter or prescription medications.  Please review your pharmacy choice. Make sure the pharmacy is open so you can pick up prescription now. If there is a problem, you may contact your provider through MyChart messaging and have the prescription routed to another pharmacy.  Your safety is important to us. If you have drug allergies check your prescription carefully.   For the next 24 hours you can use MyChart to ask questions about today's visit, request a non-urgent call back, or ask for a work or school excuse. You will get an email in the next two days asking about your experience. I hope that your e-visit has been valuable and will speed your recovery.   Meds ordered this encounter  Medications   doxycycline (VIBRA-TABS) 100 MG tablet    Sig: Take 1 tablet (100 mg total) by mouth 2 (two) times daily for 10 days.    Dispense:  20 tablet    Refill:  0    I spent approximately 5 minutes reviewing the patient's history, current symptoms and coordinating their care today.   

## 2022-10-26 ENCOUNTER — Other Ambulatory Visit: Payer: Self-pay | Admitting: Family Medicine

## 2022-10-26 NOTE — Telephone Encounter (Signed)
Requested medication (s) are due for refill today: routing for review  Requested medication (s) are on the active medication list: yes  Last refill:  10/04/22  Future visit scheduled: yes  Notes to clinic:  Unable to refill per protocol, last refill by another provider.      Requested Prescriptions  Pending Prescriptions Disp Refills   albuterol (VENTOLIN HFA) 108 (90 Base) MCG/ACT inhaler [Pharmacy Med Name: ALBUTEROL HFA (PROAIR) INHALER] 8.5 each     Sig: INHALE 2 PUFFS INTO THE LUNGS EVERY 4 HOURS AS NEEDED FOR WHEEZING OR SHORTNESS OF BREATH.     There is no refill protocol information for this order

## 2022-10-30 ENCOUNTER — Other Ambulatory Visit: Payer: Self-pay | Admitting: Obstetrics

## 2022-10-30 DIAGNOSIS — Z1231 Encounter for screening mammogram for malignant neoplasm of breast: Secondary | ICD-10-CM

## 2022-12-21 ENCOUNTER — Ambulatory Visit
Admission: RE | Admit: 2022-12-21 | Discharge: 2022-12-21 | Disposition: A | Discharge status: Home / Self Care | Payer: 59 | Source: Ambulatory Visit | Attending: Obstetrics | Admitting: Obstetrics

## 2022-12-21 DIAGNOSIS — Z1231 Encounter for screening mammogram for malignant neoplasm of breast: Secondary | ICD-10-CM

## 2023-01-25 ENCOUNTER — Other Ambulatory Visit: Payer: 59

## 2023-01-25 DIAGNOSIS — Z13220 Encounter for screening for lipoid disorders: Secondary | ICD-10-CM

## 2023-01-25 DIAGNOSIS — I10 Essential (primary) hypertension: Secondary | ICD-10-CM

## 2023-01-25 DIAGNOSIS — H4010X0 Unspecified open-angle glaucoma, stage unspecified: Secondary | ICD-10-CM

## 2023-01-26 LAB — CBC WITH DIFFERENTIAL/PLATELET
Absolute Monocytes: 358 cells/uL (ref 200–950)
Basophils Absolute: 50 cells/uL (ref 0–200)
Basophils Relative: 0.9 %
Eosinophils Absolute: 286 cells/uL (ref 15–500)
Eosinophils Relative: 5.2 %
HCT: 38.4 % (ref 35.0–45.0)
Hemoglobin: 13 g/dL (ref 11.7–15.5)
Lymphs Abs: 2343 cells/uL (ref 850–3900)
MCH: 30.4 pg (ref 27.0–33.0)
MCHC: 33.9 g/dL (ref 32.0–36.0)
MCV: 89.7 fL (ref 80.0–100.0)
MPV: 11.8 fL (ref 7.5–12.5)
Monocytes Relative: 6.5 %
Neutro Abs: 2464 cells/uL (ref 1500–7800)
Neutrophils Relative %: 44.8 %
Platelets: 241 10*3/uL (ref 140–400)
RBC: 4.28 10*6/uL (ref 3.80–5.10)
RDW: 12.3 % (ref 11.0–15.0)
Total Lymphocyte: 42.6 %
WBC: 5.5 10*3/uL (ref 3.8–10.8)

## 2023-01-26 LAB — COMPLETE METABOLIC PANEL WITH GFR
AG Ratio: 2 (calc) (ref 1.0–2.5)
ALT: 29 U/L (ref 6–29)
AST: 24 U/L (ref 10–35)
Albumin: 4.2 g/dL (ref 3.6–5.1)
Alkaline phosphatase (APISO): 62 U/L (ref 37–153)
BUN: 15 mg/dL (ref 7–25)
CO2: 30 mmol/L (ref 20–32)
Calcium: 9.3 mg/dL (ref 8.6–10.4)
Chloride: 106 mmol/L (ref 98–110)
Creat: 1.04 mg/dL (ref 0.50–1.05)
Globulin: 2.1 g/dL (calc) (ref 1.9–3.7)
Glucose, Bld: 102 mg/dL — ABNORMAL HIGH (ref 65–99)
Potassium: 5 mmol/L (ref 3.5–5.3)
Sodium: 142 mmol/L (ref 135–146)
Total Bilirubin: 0.6 mg/dL (ref 0.2–1.2)
Total Protein: 6.3 g/dL (ref 6.1–8.1)
eGFR: 60 mL/min/{1.73_m2} (ref >=60)

## 2023-01-26 LAB — LIPID PANEL
Cholesterol: 183 mg/dL (ref <200)
HDL: 59 mg/dL (ref >=50)
LDL Cholesterol (Calc): 108 mg/dL (calc) — ABNORMAL HIGH
Non-HDL Cholesterol (Calc): 124 mg/dL (calc) (ref <130)
Total CHOL/HDL Ratio: 3.1 (calc) (ref <5.0)
Triglycerides: 72 mg/dL (ref <150)

## 2023-01-29 ENCOUNTER — Ambulatory Visit (INDEPENDENT_AMBULATORY_CARE_PROVIDER_SITE_OTHER): Payer: 59 | Admitting: Family Medicine

## 2023-01-29 ENCOUNTER — Encounter: Payer: Self-pay | Admitting: Family Medicine

## 2023-01-29 VITALS — BP 126/82 | HR 71 | Temp 98.6°F | Ht 68.0 in | Wt 215.6 lb

## 2023-01-29 DIAGNOSIS — Z96652 Presence of left artificial knee joint: Secondary | ICD-10-CM | POA: Diagnosis not present

## 2023-01-29 DIAGNOSIS — Z0000 Encounter for general adult medical examination without abnormal findings: Secondary | ICD-10-CM

## 2023-01-29 DIAGNOSIS — G4734 Idiopathic sleep related nonobstructive alveolar hypoventilation: Secondary | ICD-10-CM | POA: Diagnosis not present

## 2023-01-29 DIAGNOSIS — Z0001 Encounter for general adult medical examination with abnormal findings: Secondary | ICD-10-CM

## 2023-01-29 DIAGNOSIS — Z780 Asymptomatic menopausal state: Secondary | ICD-10-CM | POA: Diagnosis not present

## 2023-01-29 NOTE — Progress Notes (Signed)
Subjective:    Patient ID: Nancy Steele, female    DOB: 1956/06/06, 67 y.o.   MRN: [id number]  HPI Patient is a very pleasant 67 year old Caucasian female who presents today for complete physical exam.  Patient's colonoscopy was in February 2023 and had 1 polyp.  It is due again in 2028.  Her mammogram was performed in March and was normal.  She is due for a bone density test.  She is due for shingles vaccine.  She is due for COVID booster.  We also discussed the RSV vaccine.  Her blood pressure is outstanding.  She recently was checking her oxygen levels at night while sleeping found that her pulse oximetry will drop into the 70s.  She denies any hypersomnolence.  She denies any fatigue.  She does not fall asleep after eating, watching TV, driving a car, riding in a car, or waiting on people on a bench.  Therefore that was canceled.  When she does have documented hypoxia at night.  She would like to try to have a home sleep study Immunization History  Administered Date(s) Administered   Influenza,inj,Quad PF,6+ Mos 09/28/2018, 07/06/2019   Influenza-Unspecified 07/28/2016, 08/11/2017, 10/20/2017, 09/16/2018, 10/31/2020   Tdap 09/08/2017   Lab on 01/25/2023  Component Date Value Ref Range Status   WBC 01/25/2023 5.5  3.8 - 10.8 Thousand/uL Final   RBC 01/25/2023 4.28  3.80 - 5.10 Million/uL Final   Hemoglobin 01/25/2023 13.0  11.7 - 15.5 g/dL Final   HCT 40/98/1191 38.4  35.0 - 45.0 % Final   MCV 01/25/2023 89.7  80.0 - 100.0 fL Final   MCH 01/25/2023 30.4  27.0 - 33.0 pg Final   MCHC 01/25/2023 33.9  32.0 - 36.0 g/dL Final   RDW 47/82/9562 12.3  11.0 - 15.0 % Final   Platelets 01/25/2023 241  140 - 400 Thousand/uL Final   MPV 01/25/2023 11.8  7.5 - 12.5 fL Final   Neutro Abs 01/25/2023 2,464  1,500 - 7,800 cells/uL Final   Lymphs Abs 01/25/2023 2,343  850 - 3,900 cells/uL Final   Absolute Monocytes 01/25/2023 358  200 - 950 cells/uL Final   Eosinophils Absolute 01/25/2023 286  15 -  500 cells/uL Final   Basophils Absolute 01/25/2023 50  0 - 200 cells/uL Final   Neutrophils Relative % 01/25/2023 44.8  % Final   Total Lymphocyte 01/25/2023 42.6  % Final   Monocytes Relative 01/25/2023 6.5  % Final   Eosinophils Relative 01/25/2023 5.2  % Final   Basophils Relative 01/25/2023 0.9  % Final   Glucose, Bld 01/25/2023 102 (H)  65 - 99 mg/dL Final   Comment: .            Fasting reference interval . For someone without known diabetes, a glucose value between 100 and 125 mg/dL is consistent with prediabetes and should be confirmed with a follow-up test. .    BUN 01/25/2023 15  7 - 25 mg/dL Final   Creat 13/05/6577 1.04  0.50 - 1.05 mg/dL Final   eGFR 46/96/2952 60  > OR = 60 mL/min/1.28m2 Final   BUN/Creatinine Ratio 01/25/2023 SEE NOTE:  6 - 22 (calc) Final   Comment:    Not Reported: BUN and Creatinine are within    reference range. .    Sodium 01/25/2023 142  135 - 146 mmol/L Final   Potassium 01/25/2023 5.0  3.5 - 5.3 mmol/L Final   Chloride 01/25/2023 106  98 - 110 mmol/L Final  CO2 01/25/2023 30  20 - 32 mmol/L Final   Calcium 01/25/2023 9.3  8.6 - 10.4 mg/dL Final   Total Protein 60/45/4098 6.3  6.1 - 8.1 g/dL Final   Albumin 11/91/4782 4.2  3.6 - 5.1 g/dL Final   Globulin 95/62/1308 2.1  1.9 - 3.7 g/dL (calc) Final   AG Ratio 01/25/2023 2.0  1.0 - 2.5 (calc) Final   Total Bilirubin 01/25/2023 0.6  0.2 - 1.2 mg/dL Final   Alkaline phosphatase (APISO) 01/25/2023 62  37 - 153 U/L Final   AST 01/25/2023 24  10 - 35 U/L Final   ALT 01/25/2023 29  6 - 29 U/L Final   Cholesterol 01/25/2023 183  <200 mg/dL Final   HDL 65/78/4696 59  > OR = 50 mg/dL Final   Triglycerides 29/52/8413 72  <150 mg/dL Final   LDL Cholesterol (Calc) 01/25/2023 108 (H)  mg/dL (calc) Final   Comment: Reference range: <100 . Desirable range <100 mg/dL for primary prevention;   <70 mg/dL for patients with CHD or diabetic patients  with > or = 2 CHD risk factors. Marland Kitchen LDL-C is now  calculated using the Martin-Hopkins  calculation, which is a validated novel method providing  better accuracy than the Friedewald equation in the  estimation of LDL-C.  Horald Pollen et al. Lenox Ahr. 2440;102(72): 2061-2068  (http://education.[facility].com/faq/FAQ164)    Total CHOL/HDL Ratio 01/25/2023 3.1  <5.3 (calc) Final   Non-HDL Cholesterol (Calc) 01/25/2023 124  <130 mg/dL (calc) Final   Comment: For patients with diabetes plus 1 major ASCVD risk  factor, treating to a non-HDL-C goal of <100 mg/dL  (LDL-C of <66 mg/dL) is considered a therapeutic  option.      Past Medical History:  Diagnosis Date   Allergy    seasonal   Arthritis    Breast cyst 03/22/2012   right breast benign   Breast cyst 2006   left breast benign   Cataract    removed left eye, very small right   DDD (degenerative disc disease), cervical    Family history of adverse reaction to anesthesia    brother slow to wake up   Glaucoma, left eye    Hyperlipidemia    past hx- no meds   Hypertension    OA (osteoarthritis)    knees   Seasonal allergies    Wears glasses    Past Surgical History:  Procedure Laterality Date   CARPAL TUNNEL RELEASE Bilateral 2014   CATARACT EXTRACTION W/ INTRAOCULAR LENS IMPLANT Left 2014   CERVICAL FUSION  2000   C5 -- C6   CHONDROPLASTY Left 10/17/2014   Procedure: CHONDROPLASTY;  Surgeon: Nilda Simmer, MD;  Location: [facility];  Service: Orthopedics;  Laterality: Left;   COLONOSCOPY     KNEE ARTHROSCOPY Bilateral left , prior to 2015;  right 2009 approx.   KNEE ARTHROSCOPY WITH LATERAL MENISECTOMY Left 10/17/2014   Procedure: KNEE ARTHROSCOPY WITH LATERAL MENISECTOMY;  Surgeon: Nilda Simmer, MD;  Location: [facility];  Service: Orthopedics;  Laterality: Left;   KNEE ARTHROSCOPY WITH MEDIAL MENISECTOMY Left 10/17/2014   Procedure: LEFT KNEE ARTHROSCOPY WITH MEDIAL MENISCECTOMY;  Surgeon: Nilda Simmer, MD;  Location: [facility];  Service: Orthopedics;  Laterality: Left;   POLYPECTOMY     TOTAL KNEE ARTHROPLASTY Left 12/06/2017   Procedure: LEFT TOTAL KNEE ARTHROPLASTY;  Surgeon: Salvatore Marvel, MD;  Location: [facility];  Service: Orthopedics;  Laterality: Left;   TOTAL KNEE ARTHROPLASTY Right  12/05/2018   Procedure: RIGHT TOTAL KNEE ARTHROPLASTY;  Surgeon: Salvatore Marvel, MD;  Location: [facility];  Service: Orthopedics;  Laterality: Right;   Current Outpatient Medications on File Prior to Visit  Medication Sig Dispense Refill   COMBIGAN 0.2-0.5 % ophthalmic solution Place 1 drop into the left eye 2 (two) times daily.   3   Cyanocobalamin (VITAMIN B 12 PO) Take by mouth.     fluticasone (FLONASE) 50 MCG/ACT nasal spray Place into both nostrils as needed for allergies or rhinitis.     ketoconazole (NIZORAL) 2 % cream Apply topically.     latanoprost (XALATAN) 0.005 % ophthalmic solution Place 1 drop into the left eye at bedtime.     Multiple Vitamin (MULTIVITAMIN) tablet Take 1 tablet by mouth daily.     No current facility-administered medications on file prior to visit.   Allergies  Allergen Reactions   Penicillins Swelling, Rash and Other (See Comments)    #####Patient has had Ancef for multiple surgeries without a reaction######   SWELLING OF THE THROAT, OTHER CHILDHOOD REACTION PCN REACTION WITH IMMEDIATE RASH, FACIAL/TONGUE/THROAT SWELLING, SOB, OR LIGHTHEADEDNESS WITH HYPOTENSION:  #  #  #  YES  #  #  #  Has patient had a PCN reaction causing severe rash involving mucus membranes or skin necrosis: Unknown Has patient had a PCN reaction that required hospitalization: No Has patient had a PCN reaction occurring within the last 10 years: No       Sulfa Antibiotics Nausea And Vomiting   Sulfasalazine Nausea And Vomiting   Vancomycin Hives   Social History   Socioeconomic History   Marital status: Married    Spouse name: Not on file   Number of children: Not on file   Years of education: Not on  file   Highest education level: Not on file  Occupational History   Not on file  Tobacco Use   Smoking status: Never   Smokeless tobacco: Never  Vaping Use   Vaping Use: Never used  Substance and Sexual Activity   Alcohol use: Not Currently   Drug use: Never   Sexual activity: Not on file  Other Topics Concern   Not on file  Social History Narrative   Not on file   Social Determinants of Health   Financial Resource Strain: Not on file  Food Insecurity: Not on file  Transportation Needs: Not on file  Physical Activity: Not on file  Stress: Not on file  Social Connections: Not on file  Intimate Partner Violence: Not on file      Review of Systems  All other systems reviewed and are negative.      Objective:   Physical Exam Vitals reviewed.  Constitutional:      General: She is not in acute distress.    Appearance: She is well-developed. She is not diaphoretic.  HENT:     Head: Normocephalic and atraumatic.     Right Ear: External ear normal.     Left Ear: External ear normal.     Nose: Nose normal.     Mouth/Throat:     Pharynx: No oropharyngeal exudate.  Eyes:     General: No scleral icterus.       Right eye: No discharge.        Left eye: No discharge.     Conjunctiva/sclera: Conjunctivae normal.     Pupils: Pupils are equal, round, and reactive to light.  Neck:     Thyroid: No thyromegaly.  Vascular: No JVD.     Trachea: No tracheal deviation.  Cardiovascular:     Rate and Rhythm: Normal rate and regular rhythm.     Heart sounds: Normal heart sounds. No murmur heard. Pulmonary:     Effort: Pulmonary effort is normal. No respiratory distress.     Breath sounds: Normal breath sounds. No stridor. No wheezing or rales.  Chest:     Chest wall: No tenderness.  Abdominal:     General: Bowel sounds are normal. There is no distension.     Palpations: Abdomen is soft. There is no mass.     Tenderness: There is no abdominal tenderness. There is no  guarding or rebound.  Musculoskeletal:     Cervical back: Normal range of motion and neck supple.  Lymphadenopathy:     Cervical: No cervical adenopathy.  Skin:    General: Skin is warm.     Coloration: Skin is not pale.     Findings: No erythema or rash.  Neurological:     Mental Status: She is alert and oriented to person, place, and time.     Cranial Nerves: No cranial nerve deficit.     Motor: No abnormal muscle tone.     Coordination: Coordination normal.     Deep Tendon Reflexes: Reflexes are normal and symmetric. Reflexes normal.  Psychiatric:        Behavior: Behavior normal.        Thought Content: Thought content normal.        Judgment: Judgment normal.           Assessment & Plan:  Postmenopausal estrogen deficiency - Plan: DG Bone Density  General medical exam  S/P total knee arthroplasty, left  Nocturnal hypoxia Cancer screening tests are up-to-date.  Recommended the shingles vaccine and the COVID booster.  Lab work is outstanding.  Recommended diet and exercise to address her elevated blood sugar.  Blood pressure is outstanding.  Schedule the patient for home sleep study given her history of nocturnal hypoxia.  Schedule her for a bone density test.

## 2023-03-09 ENCOUNTER — Encounter (HOSPITAL_BASED_OUTPATIENT_CLINIC_OR_DEPARTMENT_OTHER): Payer: Self-pay | Admitting: Pulmonary Disease

## 2023-03-09 ENCOUNTER — Ambulatory Visit (INDEPENDENT_AMBULATORY_CARE_PROVIDER_SITE_OTHER): Payer: 59 | Admitting: Pulmonary Disease

## 2023-03-09 VITALS — BP 146/80 | HR 66 | Temp 98.2°F | Ht 67.0 in | Wt 217.6 lb

## 2023-03-09 DIAGNOSIS — D649 Anemia, unspecified: Secondary | ICD-10-CM | POA: Diagnosis not present

## 2023-03-09 DIAGNOSIS — R0683 Snoring: Secondary | ICD-10-CM

## 2023-03-09 NOTE — Progress Notes (Signed)
[facility]  Chief Complaint  Patient presents with   Consult    Consult.     Past Surgical History:  She  has a past surgical history that includes Knee arthroscopy (Bilateral, left , prior to 2015;  right 2009 approx.); Cervical fusion (2000); Colonoscopy; Knee arthroscopy with medial menisectomy (Left, 10/17/2014); Chondroplasty (Left, 10/17/2014); Knee arthroscopy with lateral menisectomy (Left, 10/17/2014); Polypectomy; Carpal tunnel release (Bilateral, 2014); Total knee arthroplasty (Left, 12/06/2017); Cataract extraction w/ intraocular lens implant (Left, 2014); and Total knee arthroplasty (Right, 12/05/2018).  Past Medical History:  Allergies, OA, Breast cysts, Glaucoma, HLD, HTN  Constitutional:  BP (!) 146/80 (BP Location: Left Arm, Patient Position: Sitting, Cuff Size: Normal)   Pulse 66   Temp 98.2 F (36.8 C) (Oral)   Ht 5\' 7"  (1.702 m)   Wt 217 lb 9.6 oz (98.7 kg)   SpO2 95%   BMI 34.08 kg/m   Brief Summary:  Nancy Steele is a 67 y.o. female with snoring.      Subjective:   She is here with her husband.  She has trouble with her sleep for about 1 year.  She snores and wakes up hearing herself make funny noises.  Can't sleep on her back.  Gets funny leg feelings before going to sleep and has to move her legs.  Wakes up with a headache.  Can fall asleep watching TV in the evenings.  She goes to sleep at 11 pm.  She falls asleep 15 minutes.  She wakes up 1 or 2 times to use the bathroom.  She gets out of bed at 8 am.  She feels tired in the morning.  She was using melatonin and benadryl to help sleep, but hasn't used recently.  Drinks coffee in the morning and sometimes with dinner.  She denies sleep walking, sleep talking, bruxism, or nightmares. She denies sleep hallucinations, sleep paralysis, or cataplexy.  The Epworth score is 9 out of 24.   Physical Exam:   Appearance - well kempt   ENMT - no sinus  tenderness, no oral exudate, no LAN, Mallampati 4 airway, no stridor  Respiratory - equal breath sounds bilaterally, no wheezing or rales  CV - s1s2 regular rate and rhythm, no murmurs  Ext - no clubbing, no edema  Skin - no rashes  Psych - normal mood and affect   Sleep Tests:    Social History:  She  reports that she has never smoked. She has never used smokeless tobacco. She reports that she does not currently use alcohol. She reports that she does not use drugs.  Family History:  Her family history includes Arthritis in an other family member; Breast cancer in her maternal aunt; Diabetes in an other family member; Hypertension in an other family member; Kidney cancer in her father.    Discussion:  She has snoring, sleep disruption, apnea, and daytime sleepiness.  She has history of hypertension.  I am concerned she could have obstructive sleep apnea.  She also reports symptoms of restless leg syndrome.  Assessment/Plan:   Snoring with excessive daytime sleepiness. - will need to arrange for a home sleep study  Restless leg syndrome. - will check iron, TIBC, ferritin, B12 and folate  Obesity. - discussed how weight can impact sleep and risk for sleep disordered breathing - discussed options to assist with weight loss: combination of diet modification, cardiovascular and strength training exercises  Cardiovascular risk. - had an extensive discussion regarding  the adverse health consequences related to untreated sleep disordered breathing - specifically discussed the risks for hypertension, coronary artery disease, cardiac dysrhythmias, cerebrovascular disease, and diabetes - lifestyle modification discussed  Safe driving practices. - discussed how sleep disruption can increase risk of accidents, particularly when driving - safe driving practices were discussed  Therapies for obstructive sleep apnea. - if the sleep study shows significant sleep apnea, then various  therapies for treatment were reviewed: CPAP, oral appliance, and surgical interventions    Time Spent Involved in Patient Care on Day of Examination:  36 minutes  Follow up:   Patient Instructions  Lab tests today  Will arrange for a home sleep study  Will call to arrange for follow up after sleep study reviewed   Medication List:   Allergies as of 03/09/2023       Reactions   Penicillins Swelling, Rash, Other (See Comments)   #####Patient has had Ancef for multiple surgeries without a reaction######   SWELLING OF THE THROAT, OTHER CHILDHOOD REACTION PCN REACTION WITH IMMEDIATE RASH, FACIAL/TONGUE/THROAT SWELLING, SOB, OR LIGHTHEADEDNESS WITH HYPOTENSION:  #  #  #  YES  #  #  #  Has patient had a PCN reaction causing severe rash involving mucus membranes or skin necrosis: Unknown Has patient had a PCN reaction that required hospitalization: No Has patient had a PCN reaction occurring within the last 10 years: No     Sulfa Antibiotics Nausea And Vomiting   Sulfasalazine Nausea And Vomiting   Vancomycin Hives        Medication List        Accurate as of Mar 09, 2023  1:30 PM. If you have any questions, ask your nurse or doctor.          Combigan 0.2-0.5 % ophthalmic solution Generic drug: brimonidine-timolol Place 1 drop into the left eye 2 (two) times daily.   fluticasone 50 MCG/ACT nasal spray Commonly known as: FLONASE Place into both nostrils as needed for allergies or rhinitis.   ketoconazole 2 % cream Commonly known as: NIZORAL Apply topically.   latanoprost 0.005 % ophthalmic solution Commonly known as: XALATAN Place 1 drop into the left eye at bedtime.   multivitamin tablet Take 1 tablet by mouth daily.   VITAMIN B 12 PO Take by mouth.        Signature:  Coralyn Helling, MD [facility] Pulmonary/Critical Care Pager - [phone] 03/09/2023, 1:30 PM

## 2023-03-09 NOTE — Patient Instructions (Signed)
Lab tests today  Will arrange for a home sleep study  Will call to arrange for follow up after sleep study reviewed  

## 2023-03-10 LAB — IRON AND TIBC
Iron Saturation: 43 % (ref 15–55)
Iron: 129 ug/dL (ref 27–139)
Total Iron Binding Capacity: 300 ug/dL (ref 250–450)
UIBC: 171 ug/dL (ref 118–369)

## 2023-03-10 LAB — FOLATE: Folate: 10.4 ng/mL (ref >3.0)

## 2023-03-10 LAB — FERRITIN: Ferritin: 156 ng/mL — ABNORMAL HIGH (ref 15–150)

## 2023-03-10 LAB — VITAMIN B12: Vitamin B-12: 468 pg/mL (ref 232–1245)

## 2023-03-12 ENCOUNTER — Telehealth (HOSPITAL_BASED_OUTPATIENT_CLINIC_OR_DEPARTMENT_OTHER): Payer: Self-pay | Admitting: Pulmonary Disease

## 2023-03-12 NOTE — Telephone Encounter (Signed)
Patient states she will have new insurance starting June 1st Medicare A and B w/ [employer] and wanted to know if it would be better for her to wait for the [facility] order or can go ahead with the [employer] order. Please advise and call patient back.

## 2023-03-16 NOTE — Telephone Encounter (Signed)
Left VM for pt to return call-provided my direct phone#.  I have a couple of questions about insurance to ask pt before being able to provide answer.

## 2023-04-25 DIAGNOSIS — G4730 Sleep apnea, unspecified: Secondary | ICD-10-CM | POA: Diagnosis not present

## 2023-05-07 ENCOUNTER — Telehealth: Payer: Self-pay | Admitting: Pulmonary Disease

## 2023-05-07 ENCOUNTER — Encounter (INDEPENDENT_AMBULATORY_CARE_PROVIDER_SITE_OTHER): Payer: 59

## 2023-05-07 DIAGNOSIS — G4733 Obstructive sleep apnea (adult) (pediatric): Secondary | ICD-10-CM

## 2023-05-07 DIAGNOSIS — R0683 Snoring: Secondary | ICD-10-CM

## 2023-05-07 NOTE — Telephone Encounter (Signed)
HST 04/25/23 >> AHI 5.6, SpO2 low 85%   Please inform her that her sleep study shows mild obstructive sleep apnea.  Please arrange for ROV with me or NP to discuss treatment options.

## 2023-05-13 NOTE — Telephone Encounter (Signed)
Patient would like the nurse to call her to go over her sleep test results.  She would like some clarification.  CB# [phone]

## 2023-05-14 NOTE — Telephone Encounter (Signed)
Spoke with the pt and notified of results/recs  Appt with Katie for next wk was scheduled  Nothing further needed

## 2023-05-18 ENCOUNTER — Encounter: Payer: Self-pay | Admitting: Nurse Practitioner

## 2023-05-18 ENCOUNTER — Ambulatory Visit: Payer: Medicare HMO | Admitting: Nurse Practitioner

## 2023-05-18 VITALS — BP 132/80 | HR 69 | Temp 97.9°F | Ht 67.5 in | Wt 218.2 lb

## 2023-05-18 DIAGNOSIS — G4733 Obstructive sleep apnea (adult) (pediatric): Secondary | ICD-10-CM

## 2023-05-18 DIAGNOSIS — Z6833 Body mass index (BMI) 33.0-33.9, adult: Secondary | ICD-10-CM

## 2023-05-18 DIAGNOSIS — E6609 Other obesity due to excess calories: Secondary | ICD-10-CM

## 2023-05-18 DIAGNOSIS — E66811 Obesity, class 1: Secondary | ICD-10-CM | POA: Insufficient documentation

## 2023-05-18 DIAGNOSIS — G479 Sleep disorder, unspecified: Secondary | ICD-10-CM | POA: Diagnosis not present

## 2023-05-18 DIAGNOSIS — E669 Obesity, unspecified: Secondary | ICD-10-CM | POA: Insufficient documentation

## 2023-05-18 NOTE — Assessment & Plan Note (Signed)
BMI 33. See above

## 2023-05-18 NOTE — Progress Notes (Signed)
Reviewed and agree with assessment/plan.   Coralyn Helling, MD [facility] Pulmonary/Critical Care 05/18/2023, 10:59 AM Pager:  [phone]

## 2023-05-18 NOTE — Assessment & Plan Note (Signed)
Very mild OSA with AHI 5.6/h. Discussed risks of untreated mild OSA and potential treatment options. She would like to move forward with oral appliance. Referral placed to orthodontics today. Aware of safe driving practices. Healthy weight loss encouraged.  Patient Instructions  Your sleep study shows mild sleep apnea. We discussed the minimal health risks of untreated mild sleep apnea. We also briefly reviewed treatment options including weight loss, side sleeping position, oral appliance, CPAP therapy. You have opted for oral appliance. Referral placed today  Follow up in 6 months with Dr. Wynona Neat, Dr. Vassie Loll or Philis Nettle, or sooner, if needed

## 2023-05-18 NOTE — Patient Instructions (Signed)
Your sleep study shows mild sleep apnea. We discussed the minimal health risks of untreated mild sleep apnea. We also briefly reviewed treatment options including weight loss, side sleeping position, oral appliance, CPAP therapy. You have opted for oral appliance. Referral placed today  Follow up in 6 months with Dr. Wynona Neat, Dr. Vassie Loll or Philis Nettle, or sooner, if needed

## 2023-05-18 NOTE — Progress Notes (Signed)
@Patient  ID: Nancy Steele, female    DOB: Apr 04, 1956, 67 y.o.   MRN: [id number]  Chief Complaint  Patient presents with   Follow-up    Review HST results and options    Referring provider: Donita Brooks, MD  HPI: 67 year old female, never smoker followed for mild obstructive sleep apnea.  She is a patient of Dr. Evlyn Courier and last seen in office 03/09/2023. Past medical history significant for HTN, OA, glaucoma.  TEST/EVENTS:  04/25/2023 HST: AHI 5.6, SpO2 low 85%  03/09/2023: OV with Dr. Craige Cotta for sleep consult. Trouble with snoring. Can wake herself up. Can't sleep on her back. Gets funny feelings in her legs before going to sleep and has to move them. Wakes up with a headache. Has some daytime sleepiness. HST ordered. Iron studies for RLS nl.  05/18/2023: Today - follow up Patient presents today for follow up to discuss home sleep study results which revealed very mild OSA.  She feels unchanged compared to her last visit.  She would prefer not to have to use CPAP if she does not have to.  Her husband has manage she does not think that she can tolerate it.  She wants to know what her other potential options would be.  She does struggle with tossing and turning at night.  She seems to be very sensitive to medicines.  Has taken melatonin in the past but made her feel groggy the next morning.  Would like to avoid further pharmacological therapy if possible.  Denies any drowsy driving or sleep parasomnia/paralysis.  Allergies  Allergen Reactions   Penicillins Swelling, Rash and Other (See Comments)    #####Patient has had Ancef for multiple surgeries without a reaction######   SWELLING OF THE THROAT, OTHER CHILDHOOD REACTION PCN REACTION WITH IMMEDIATE RASH, FACIAL/TONGUE/THROAT SWELLING, SOB, OR LIGHTHEADEDNESS WITH HYPOTENSION:  #  #  #  YES  #  #  #  Has patient had a PCN reaction causing severe rash involving mucus membranes or skin necrosis: Unknown Has patient had a PCN reaction  that required hospitalization: No Has patient had a PCN reaction occurring within the last 10 years: No       Sulfa Antibiotics Nausea And Vomiting   Sulfasalazine Nausea And Vomiting   Vancomycin Hives    Immunization History  Administered Date(s) Administered   Influenza,inj,Quad PF,6+ Mos 09/28/2018, 07/06/2019   Influenza-Unspecified 07/28/2016, 08/11/2017, 10/20/2017, 09/16/2018, 10/31/2020   Tdap 09/08/2017    Past Medical History:  Diagnosis Date   Allergy    seasonal   Arthritis    Breast cyst 03/22/2012   right breast benign   Breast cyst 2006   left breast benign   Cataract    removed left eye, very small right   DDD (degenerative disc disease), cervical    Family history of adverse reaction to anesthesia    brother slow to wake up   Glaucoma, left eye    Hyperlipidemia    past hx- no meds   Hypertension    OA (osteoarthritis)    knees   Seasonal allergies    Wears glasses     Tobacco History: Social History   Tobacco Use  Smoking Status Never  Smokeless Tobacco Never   Counseling given: Not Answered   Outpatient Medications Prior to Visit  Medication Sig Dispense Refill   COMBIGAN 0.2-0.5 % ophthalmic solution Place 1 drop into the left eye 2 (two) times daily.   3   Cyanocobalamin (VITAMIN B 12 PO) Take  by mouth.     fluticasone (FLONASE) 50 MCG/ACT nasal spray Place into both nostrils as needed for allergies or rhinitis.     ketoconazole (NIZORAL) 2 % cream Apply topically.     latanoprost (XALATAN) 0.005 % ophthalmic solution Place 1 drop into the left eye at bedtime.     Multiple Vitamin (MULTIVITAMIN) tablet Take 1 tablet by mouth daily.     No facility-administered medications prior to visit.     Review of Systems:   Constitutional: No weight loss or gain, night sweats, fevers, chills, or lassitude. +occasional fatigue  HEENT: No difficulty swallowing, tooth/dental problems, or sore throat. No sneezing, itching, ear ache, nasal  congestion, or post nasal drip. +occasional morning headaches  CV:  No chest pain, orthopnea, PND, swelling in lower extremities, anasarca, dizziness, palpitations, syncope Resp: +snoring. No shortness of breath with exertion or at rest. No excess mucus or change in color of mucus. No productive or non-productive. No hemoptysis. No wheezing.  No chest wall deformity GI:  No heartburn, indigestion GU: No nocturia MSK:  No joint pain or swelling.   Neuro: No dizziness or lightheadedness.  Psych: No depression or anxiety. Mood stable.     Physical Exam:  BP 132/80 (BP Location: Right Arm, Patient Position: Sitting, Cuff Size: Normal)   Pulse 69   Temp 97.9 F (36.6 C) (Oral)   Ht 5' 7.5" (1.715 m)   Wt 218 lb 3.2 oz (99 kg)   SpO2 95%   BMI 33.67 kg/m   GEN: Pleasant, interactive, well-appearing; obese; in no acute distress. HEENT:  Normocephalic and atraumatic. PERRLA. Sclera white. Nasal turbinates pink, moist and patent bilaterally. No rhinorrhea present. Oropharynx pink and moist, without exudate or edema. No lesions, ulcerations, or postnasal drip. Mallampati III/IV NECK:  Supple w/ fair ROM. No JVD present. Normal carotid impulses w/o bruits. Thyroid symmetrical with no goiter or nodules palpated. No lymphadenopathy.   CV: RRR, no m/r/g, no peripheral edema. Pulses intact, +2 bilaterally. No cyanosis, pallor or clubbing. PULMONARY:  Unlabored, regular breathing. Clear bilaterally A&P w/o wheezes/rales/rhonchi. No accessory muscle use.  GI: BS present and normoactive. Soft, non-tender to palpation. No organomegaly or masses detected.  MSK: No erythema, warmth or tenderness. Cap refil <2 sec all extrem. No deformities or joint swelling noted.  Neuro: A/Ox3. No focal deficits noted.   Skin: Warm, no lesions or rashe Psych: Normal affect and behavior. Judgement and thought content appropriate.     Lab Results:  CBC    Component Value Date/Time   WBC 5.5 01/25/2023 0815   RBC  4.28 01/25/2023 0815   HGB 13.0 01/25/2023 0815   HCT 38.4 01/25/2023 0815   PLT 241 01/25/2023 0815   MCV 89.7 01/25/2023 0815   MCH 30.4 01/25/2023 0815   MCHC 33.9 01/25/2023 0815   RDW 12.3 01/25/2023 0815   LYMPHSABS 2,343 01/25/2023 0815   MONOABS 0.3 11/28/2018 1029   EOSABS 286 01/25/2023 0815   BASOSABS 50 01/25/2023 0815    BMET    Component Value Date/Time   NA 142 01/25/2023 0815   K 5.0 01/25/2023 0815   CL 106 01/25/2023 0815   CO2 30 01/25/2023 0815   GLUCOSE 102 (H) 01/25/2023 0815   BUN 15 01/25/2023 0815   CREATININE 1.04 01/25/2023 0815   CALCIUM 9.3 01/25/2023 0815   GFRNONAA 77 07/06/2019 0935   GFRAA 90 07/06/2019 0935    BNP No results found for: "BNP"   Imaging:  No results found.  Administration History     None           No data to display          No results found for: "NITRICOXIDE"      Assessment & Plan:   Mild obstructive sleep apnea Very mild OSA with AHI 5.6/h. Discussed risks of untreated mild OSA and potential treatment options. She would like to move forward with oral appliance. Referral placed to orthodontics today. Aware of safe driving practices. Healthy weight loss encouraged.  Patient Instructions  Your sleep study shows mild sleep apnea. We discussed the minimal health risks of untreated mild sleep apnea. We also briefly reviewed treatment options including weight loss, side sleeping position, oral appliance, CPAP therapy. You have opted for oral appliance. Referral placed today  Follow up in 6 months with Dr. Wynona Neat, Dr. Vassie Loll or Florentina Addison Shawneequa Baldridge,NP, or sooner, if needed    Class 1 obesity with body mass index (BMI) of 33.0 to 33.9 in adult BMI 33. See above  Restless sleeper See above. Will re-evaluate after she begins therapy with oral appliance. She would like to hold off on medications for now. Sleep hygiene reviewed.   I spent 32 minutes of dedicated to the care of this patient on the date of this  encounter to include pre-visit review of records, face-to-face time with the patient discussing conditions above, post visit ordering of testing, clinical documentation with the electronic health record, making appropriate referrals as documented, and communicating necessary findings to members of the patients care team.  Noemi Chapel, NP 05/18/2023  Pt aware and understands NP's role.

## 2023-05-18 NOTE — Assessment & Plan Note (Signed)
See above. Will re-evaluate after she begins therapy with oral appliance. She would like to hold off on medications for now. Sleep hygiene reviewed.

## 2023-05-27 ENCOUNTER — Ambulatory Visit
Admission: RE | Admit: 2023-05-27 | Discharge: 2023-05-27 | Disposition: A | Discharge status: Home / Self Care | Payer: Medicare HMO | Source: Ambulatory Visit | Attending: Family Medicine | Admitting: Family Medicine

## 2023-05-27 DIAGNOSIS — E349 Endocrine disorder, unspecified: Secondary | ICD-10-CM | POA: Diagnosis not present

## 2023-05-27 DIAGNOSIS — Z780 Asymptomatic menopausal state: Secondary | ICD-10-CM

## 2023-05-27 DIAGNOSIS — H4052X2 Glaucoma secondary to other eye disorders, left eye, moderate stage: Secondary | ICD-10-CM | POA: Diagnosis not present

## 2023-05-27 DIAGNOSIS — N958 Other specified menopausal and perimenopausal disorders: Secondary | ICD-10-CM | POA: Diagnosis not present

## 2023-06-12 DIAGNOSIS — Z809 Family history of malignant neoplasm, unspecified: Secondary | ICD-10-CM | POA: Diagnosis not present

## 2023-06-12 DIAGNOSIS — Z880 Allergy status to penicillin: Secondary | ICD-10-CM | POA: Diagnosis not present

## 2023-06-12 DIAGNOSIS — Z8249 Family history of ischemic heart disease and other diseases of the circulatory system: Secondary | ICD-10-CM | POA: Diagnosis not present

## 2023-06-12 DIAGNOSIS — Z6834 Body mass index (BMI) 34.0-34.9, adult: Secondary | ICD-10-CM | POA: Diagnosis not present

## 2023-06-12 DIAGNOSIS — J309 Allergic rhinitis, unspecified: Secondary | ICD-10-CM | POA: Diagnosis not present

## 2023-06-12 DIAGNOSIS — Z882 Allergy status to sulfonamides status: Secondary | ICD-10-CM | POA: Diagnosis not present

## 2023-06-12 DIAGNOSIS — I10 Essential (primary) hypertension: Secondary | ICD-10-CM | POA: Diagnosis not present

## 2023-06-12 DIAGNOSIS — E669 Obesity, unspecified: Secondary | ICD-10-CM | POA: Diagnosis not present

## 2023-06-12 DIAGNOSIS — H409 Unspecified glaucoma: Secondary | ICD-10-CM | POA: Diagnosis not present

## 2023-06-12 DIAGNOSIS — Z833 Family history of diabetes mellitus: Secondary | ICD-10-CM | POA: Diagnosis not present

## 2023-06-12 DIAGNOSIS — Z008 Encounter for other general examination: Secondary | ICD-10-CM | POA: Diagnosis not present

## 2023-06-12 DIAGNOSIS — Z881 Allergy status to other antibiotic agents status: Secondary | ICD-10-CM | POA: Diagnosis not present

## 2023-08-07 DIAGNOSIS — R69 Illness, unspecified: Secondary | ICD-10-CM | POA: Diagnosis not present

## 2023-08-17 ENCOUNTER — Other Ambulatory Visit: Payer: 59

## 2023-09-07 ENCOUNTER — Ambulatory Visit (INDEPENDENT_AMBULATORY_CARE_PROVIDER_SITE_OTHER): Payer: Medicare HMO | Admitting: Family Medicine

## 2023-09-07 ENCOUNTER — Encounter: Payer: Self-pay | Admitting: Family Medicine

## 2023-09-07 VITALS — BP 142/88 | HR 72 | Temp 98.0°F | Ht 67.5 in | Wt 222.1 lb

## 2023-09-07 DIAGNOSIS — I10 Essential (primary) hypertension: Secondary | ICD-10-CM | POA: Diagnosis not present

## 2023-09-07 MED ORDER — LISINOPRIL 10 MG PO TABS: 10.0000 mg | ORAL_TABLET | Freq: Every day | ORAL | 3 refills | Status: AC

## 2023-09-07 NOTE — Progress Notes (Signed)
Subjective:    Patient ID: Nancy Steele, female    DOB: 04/14/1956, 67 y.o.   MRN: [id number]  HPI Patient is a very sweet 67 year old Caucasian female who has a history of hypertension.  She was taking Wegovy through her gynecologist and had lost fair amount of weight.  Her blood pressure had dropped so we were able to discontinue lisinopril.  She was taking 10 mg a day.  However, her blood pressure has gradually started to trend back up.  She states that she is seeing blood pressures at home between 140 and 160/90.  She denies any chest pain or shortness of breath.  She denies any change in her diet.  She is not eating a lot of salt. Past Medical History:  Diagnosis Date   Allergy    seasonal   Arthritis    Breast cyst 03/22/2012   right breast benign   Breast cyst 2006   left breast benign   Cataract    removed left eye, very small right   DDD (degenerative disc disease), cervical    Family history of adverse reaction to anesthesia    brother slow to wake up   Glaucoma, left eye    Hyperlipidemia    past hx- no meds   Hypertension    OA (osteoarthritis)    knees   Seasonal allergies    Wears glasses    Past Surgical History:  Procedure Laterality Date   CARPAL TUNNEL RELEASE Bilateral 2014   CATARACT EXTRACTION W/ INTRAOCULAR LENS IMPLANT Left 2014   CERVICAL FUSION  2000   C5 -- C6   CHONDROPLASTY Left 10/17/2014   Procedure: CHONDROPLASTY;  Surgeon: Nilda Simmer, MD;  Location: [facility];  Service: Orthopedics;  Laterality: Left;   COLONOSCOPY     KNEE ARTHROSCOPY Bilateral left , prior to 2015;  right 2009 approx.   KNEE ARTHROSCOPY WITH LATERAL MENISECTOMY Left 10/17/2014   Procedure: KNEE ARTHROSCOPY WITH LATERAL MENISECTOMY;  Surgeon: Nilda Simmer, MD;  Location: [facility];  Service: Orthopedics;  Laterality: Left;   KNEE ARTHROSCOPY WITH MEDIAL MENISECTOMY Left 10/17/2014   Procedure: LEFT KNEE ARTHROSCOPY WITH MEDIAL  MENISCECTOMY;  Surgeon: Nilda Simmer, MD;  Location: [facility];  Service: Orthopedics;  Laterality: Left;   POLYPECTOMY     TOTAL KNEE ARTHROPLASTY Left 12/06/2017   Procedure: LEFT TOTAL KNEE ARTHROPLASTY;  Surgeon: Salvatore Marvel, MD;  Location: [facility];  Service: Orthopedics;  Laterality: Left;   TOTAL KNEE ARTHROPLASTY Right 12/05/2018   Procedure: RIGHT TOTAL KNEE ARTHROPLASTY;  Surgeon: Salvatore Marvel, MD;  Location: [facility];  Service: Orthopedics;  Laterality: Right;   Current Outpatient Medications on File Prior to Visit  Medication Sig Dispense Refill   COMBIGAN 0.2-0.5 % ophthalmic solution Place 1 drop into the left eye 2 (two) times daily.   3   Cyanocobalamin (VITAMIN B 12 PO) Take by mouth.     fluticasone (FLONASE) 50 MCG/ACT nasal spray Place into both nostrils as needed for allergies or rhinitis.     ketoconazole (NIZORAL) 2 % cream Apply topically.     latanoprost (XALATAN) 0.005 % ophthalmic solution Place 1 drop into the left eye at bedtime.     Multiple Vitamin (MULTIVITAMIN) tablet Take 1 tablet by mouth daily.     No current facility-administered medications on file prior to visit.   Allergies  Allergen Reactions   Penicillins Swelling, Rash and Other (See Comments)    #####Patient  has had Ancef for multiple surgeries without a reaction######   SWELLING OF THE THROAT, OTHER CHILDHOOD REACTION PCN REACTION WITH IMMEDIATE RASH, FACIAL/TONGUE/THROAT SWELLING, SOB, OR LIGHTHEADEDNESS WITH HYPOTENSION:  #  #  #  YES  #  #  #  Has patient had a PCN reaction causing severe rash involving mucus membranes or skin necrosis: Unknown Has patient had a PCN reaction that required hospitalization: No Has patient had a PCN reaction occurring within the last 10 years: No       Sulfa Antibiotics Nausea And Vomiting   Sulfasalazine Nausea And Vomiting   Vancomycin Hives   Social History   Socioeconomic History   Marital status: Married    Spouse name: Not on  file   Number of children: Not on file   Years of education: Not on file   Highest education level: Bachelor's degree (e.g., BA, AB, BS)  Occupational History   Not on file  Tobacco Use   Smoking status: Never   Smokeless tobacco: Never  Vaping Use   Vaping status: Never Used  Substance and Sexual Activity   Alcohol use: Not Currently   Drug use: Never   Sexual activity: Not on file  Other Topics Concern   Not on file  Social History Narrative   Not on file   Social Determinants of Health   Financial Resource Strain: Low Risk  (09/06/2023)   Overall Financial Resource Strain (CARDIA)    Difficulty of Paying Living Expenses: Not hard at all  Food Insecurity: No Food Insecurity (09/06/2023)   Hunger Vital Sign    Worried About Running Out of Food in the Last Year: Never true    Ran Out of Food in the Last Year: Never true  Transportation Needs: No Transportation Needs (09/06/2023)   PRAPARE - Administrator, Civil Service (Medical): No    Lack of Transportation (Non-Medical): No  Physical Activity: Sufficiently Active (09/06/2023)   Exercise Vital Sign    Days of Exercise per Week: 3 days    Minutes of Exercise per Session: 60 min  Stress: No Stress Concern Present (09/06/2023)   [employer] of Occupational Health - Occupational Stress Questionnaire    Feeling of Stress : Only a little  Social Connections: Moderately Integrated (09/06/2023)   Social Connection and Isolation Panel [NHANES]    Frequency of Communication with Friends and Family: More than three times a week    Frequency of Social Gatherings with Friends and Family: More than three times a week    Attends Religious Services: More than 4 times per year    Active Member of [employer] or Organizations: No    Attends Engineer, structural: Not on file    Marital Status: Married  Catering manager Violence: Not on file      Review of Systems  All other systems reviewed and are  negative.      Objective:   Physical Exam Vitals reviewed.  Constitutional:      General: She is not in acute distress.    Appearance: She is well-developed. She is not diaphoretic.  HENT:     Head: Normocephalic and atraumatic.     Right Ear: External ear normal.     Left Ear: External ear normal.     Nose: Nose normal.     Mouth/Throat:     Pharynx: No oropharyngeal exudate.  Eyes:     General: No scleral icterus.       Right eye: No  discharge.        Left eye: No discharge.     Conjunctiva/sclera:     Left eye: Left conjunctiva is injected.     Pupils: Pupils are equal, round, and reactive to light.  Neck:     Thyroid: No thyromegaly.     Vascular: No JVD.     Trachea: No tracheal deviation.  Cardiovascular:     Rate and Rhythm: Normal rate and regular rhythm.     Heart sounds: Normal heart sounds. No murmur heard. Pulmonary:     Effort: Pulmonary effort is normal. No respiratory distress.     Breath sounds: Normal breath sounds. No stridor. No wheezing or rales.  Chest:     Chest wall: No tenderness.  Abdominal:     General: Bowel sounds are normal. There is no distension.     Palpations: Abdomen is soft. There is no mass.     Tenderness: There is no abdominal tenderness. There is no guarding or rebound.  Musculoskeletal:     Cervical back: Normal range of motion and neck supple.  Lymphadenopathy:     Cervical: No cervical adenopathy.  Skin:    General: Skin is warm.     Coloration: Skin is not pale.     Findings: No erythema or rash.  Neurological:     Mental Status: She is alert and oriented to person, place, and time.     Cranial Nerves: No cranial nerve deficit.     Motor: No abnormal muscle tone.     Coordination: Coordination normal.     Deep Tendon Reflexes: Reflexes are normal and symmetric.  Psychiatric:        Behavior: Behavior normal.        Thought Content: Thought content normal.        Judgment: Judgment normal.           Assessment  & Plan:  Benign essential HTN - Plan: BASIC METABOLIC PANEL WITH GFR We decided to resume lisinopril 10 mg a day.  She will notify me of her blood pressure in 1 week.  Gradually increase lisinopril if necessary to achieve blood pressures less than 140/90

## 2023-09-08 LAB — BASIC METABOLIC PANEL WITH GFR
BUN: 16 mg/dL (ref 7–25)
CO2: 29 mmol/L (ref 20–32)
Calcium: 9.4 mg/dL (ref 8.6–10.4)
Chloride: 104 mmol/L (ref 98–110)
Creat: 0.94 mg/dL (ref 0.50–1.05)
Glucose, Bld: 94 mg/dL (ref 65–99)
Potassium: 4.9 mmol/L (ref 3.5–5.3)
Sodium: 140 mmol/L (ref 135–146)
eGFR: 67 mL/min/{1.73_m2} (ref >=60)

## 2023-09-27 DIAGNOSIS — H4052X2 Glaucoma secondary to other eye disorders, left eye, moderate stage: Secondary | ICD-10-CM | POA: Diagnosis not present

## 2023-10-21 ENCOUNTER — Telehealth: Payer: Self-pay | Admitting: Student

## 2023-10-21 NOTE — Telephone Encounter (Signed)
 Typically if we refer for oral appliance, we do see them back to ensure they are well managed. If she does not wish to follow up, that is fine. Thanks.

## 2023-10-21 NOTE — Telephone Encounter (Signed)
 I am calling PT's for FU appts. This lady dcln appt stating "I don't think so.They didn't think I needed and further sleep tests so I am going to decline for now."  Per Fredric Mare, letting provider know. NFN.

## 2023-11-24 ENCOUNTER — Telehealth: Payer: HMO | Admitting: Physician Assistant

## 2023-11-24 DIAGNOSIS — J019 Acute sinusitis, unspecified: Secondary | ICD-10-CM

## 2023-11-24 DIAGNOSIS — B9689 Other specified bacterial agents as the cause of diseases classified elsewhere: Secondary | ICD-10-CM | POA: Diagnosis not present

## 2023-11-24 MED ORDER — DOXYCYCLINE HYCLATE 100 MG PO TABS: 100.0000 mg | ORAL_TABLET | Freq: Two times a day (BID) | ORAL | 0 refills | Status: DC

## 2023-11-24 NOTE — Progress Notes (Signed)

## 2023-12-20 DIAGNOSIS — M545 Low back pain, unspecified: Secondary | ICD-10-CM | POA: Diagnosis not present

## 2024-01-26 DIAGNOSIS — H25811 Combined forms of age-related cataract, right eye: Secondary | ICD-10-CM | POA: Diagnosis not present

## 2024-01-26 DIAGNOSIS — H04123 Dry eye syndrome of bilateral lacrimal glands: Secondary | ICD-10-CM | POA: Diagnosis not present

## 2024-01-26 DIAGNOSIS — H4052X2 Glaucoma secondary to other eye disorders, left eye, moderate stage: Secondary | ICD-10-CM | POA: Diagnosis not present

## 2024-01-26 DIAGNOSIS — Z961 Presence of intraocular lens: Secondary | ICD-10-CM | POA: Diagnosis not present

## 2024-02-21 ENCOUNTER — Ambulatory Visit: Payer: Self-pay

## 2024-02-21 NOTE — Telephone Encounter (Signed)
 Copied from CRM [id number]. Topic: Clinical - Red Word Triage >> Feb 21, 2024 10:56 AM Nancy Steele wrote: Red Word that prompted transfer to Nurse Triage: patient called to schedule her yearly physical but advised they are experiencing leg and hand cramps/pain  Chief Complaint: leg, hands, fingers and toes cramping Symptoms: see above Frequency: comes and goes Pertinent Negatives: Patient denies cp, sob Disposition: [] ED /[] Urgent Care (no appt availability in office) / [x] Appointment(In office/virtual)/ []  [facility]/ [] Home Care/ [] Refused Recommended Disposition /[] [facility]/ []  Follow-up with PCP Additional Notes: per protocol apt made for tomorrow; care advice given, denies questions; instructed to go to ER if becomes worse.   Reason for Disposition  [1] MILD pain (e.g., does not interfere with normal activities) AND [2] present > 7 days  Answer Assessment - Initial Assessment Questions 1. ONSET: "When did the pain start?"      Cramps to hands and legs for three weeks 2. LOCATION: "Where is the pain located?"      Legs arms fingers toes 3. PAIN: "How bad is the pain?"    (Scale 1-10; or mild, moderate, severe)   -  MILD (1-3): doesn't interfere with normal activities    -  MODERATE (4-7): interferes with normal activities (e.g., work or school) or awakens from sleep, limping    -  SEVERE (8-10): excruciating pain, unable to do any normal activities, unable to walk     mild 4. WORK OR EXERCISE: "Has there been any recent work or exercise that involved this part of the body?"      no 5. CAUSE: "What do you think is causing the leg pain?"     unknown 6. OTHER SYMPTOMS: "Do you have any other symptoms?" (e.g., chest pain, back pain, breathing difficulty, swelling, rash, fever, numbness, weakness)     no 7. PREGNANCY: "Is there any chance you are pregnant?" "When was your last menstrual period?"     no  Protocols used: Leg Pain-A-AH

## 2024-02-22 ENCOUNTER — Encounter: Payer: Self-pay | Admitting: Family Medicine

## 2024-02-22 ENCOUNTER — Ambulatory Visit (INDEPENDENT_AMBULATORY_CARE_PROVIDER_SITE_OTHER): Admitting: Family Medicine

## 2024-02-22 VITALS — BP 132/78 | HR 57 | Temp 97.8°F | Ht 67.5 in | Wt 224.2 lb

## 2024-02-22 DIAGNOSIS — R252 Cramp and spasm: Secondary | ICD-10-CM | POA: Diagnosis not present

## 2024-02-22 NOTE — Progress Notes (Signed)
 Subjective:    Patient ID: Nancy Steele, female    DOB: 17-Oct-1956, 68 y.o.   MRN: [id number]   Patient is a very sweet 68 year old Caucasian female who has a history of hypertension.  Patient has recently noticed more "muscle cramps.  She states that almost on a nightly basis she is having cramps in her hands or in her legs or in her feet.  She denies any spasticity.  She denies any muscle weakness.  She denies any clumsiness.  She denies any muscle pain.  She denies any frequent falls.  She states that she is drinking plenty of water . Past Medical History:  Diagnosis Date   Allergy    seasonal   Arthritis    Breast cyst 03/22/2012   right breast benign   Breast cyst 2006   left breast benign   Cataract    removed left eye, very small right   DDD (degenerative disc disease), cervical    Family history of adverse reaction to anesthesia    brother slow to wake up   Glaucoma, left eye    Hyperlipidemia    past hx- no meds   Hypertension    OA (osteoarthritis)    knees   Seasonal allergies    Wears glasses    Past Surgical History:  Procedure Laterality Date   CARPAL TUNNEL RELEASE Bilateral 2014   CATARACT EXTRACTION W/ INTRAOCULAR LENS IMPLANT Left 2014   CERVICAL FUSION  2000   C5 -- C6   CHONDROPLASTY Left 10/17/2014   Procedure: CHONDROPLASTY;  Surgeon: Genevie Kerns, MD;  Location: [facility];  Service: Orthopedics;  Laterality: Left;   COLONOSCOPY     KNEE ARTHROSCOPY Bilateral left , prior to 2015;  right 2009 approx.   KNEE ARTHROSCOPY WITH LATERAL MENISECTOMY Left 10/17/2014   Procedure: KNEE ARTHROSCOPY WITH LATERAL MENISECTOMY;  Surgeon: Genevie Kerns, MD;  Location: [facility];  Service: Orthopedics;  Laterality: Left;   KNEE ARTHROSCOPY WITH MEDIAL MENISECTOMY Left 10/17/2014   Procedure: LEFT KNEE ARTHROSCOPY WITH MEDIAL MENISCECTOMY;  Surgeon: Genevie Kerns, MD;  Location: [facility];  Service:  Orthopedics;  Laterality: Left;   POLYPECTOMY     TOTAL KNEE ARTHROPLASTY Left 12/06/2017   Procedure: LEFT TOTAL KNEE ARTHROPLASTY;  Surgeon: Elly Habermann, MD;  Location: [facility];  Service: Orthopedics;  Laterality: Left;   TOTAL KNEE ARTHROPLASTY Right 12/05/2018   Procedure: RIGHT TOTAL KNEE ARTHROPLASTY;  Surgeon: Elly Habermann, MD;  Location: [facility];  Service: Orthopedics;  Laterality: Right;   Current Outpatient Medications on File Prior to Visit  Medication Sig Dispense Refill   COMBIGAN  0.2-0.5 % ophthalmic solution Place 1 drop into the left eye 2 (two) times daily.   3   Cyanocobalamin (VITAMIN B 12 PO) Take by mouth.     fluticasone (FLONASE) 50 MCG/ACT nasal spray Place into both nostrils as needed for allergies or rhinitis.     ketoconazole (NIZORAL) 2 % cream Apply topically.     latanoprost (XALATAN) 0.005 % ophthalmic solution Place 1 drop into the left eye at bedtime.     lisinopril  (ZESTRIL ) 10 MG tablet Take 1 tablet (10 mg total) by mouth daily. 90 tablet 3   Multiple Vitamin (MULTIVITAMIN) tablet Take 1 tablet by mouth daily.     No current facility-administered medications on file prior to visit.   Allergies  Allergen Reactions   Penicillins Swelling, Rash and Other (See Comments)    #####Patient has  had Ancef  for multiple surgeries without a reaction######   SWELLING OF THE THROAT, OTHER CHILDHOOD REACTION PCN REACTION WITH IMMEDIATE RASH, FACIAL/TONGUE/THROAT SWELLING, SOB, OR LIGHTHEADEDNESS WITH HYPOTENSION:  #  #  #  YES  #  #  #  Has patient had a PCN reaction causing severe rash involving mucus membranes or skin necrosis: Unknown Has patient had a PCN reaction that required hospitalization: No Has patient had a PCN reaction occurring within the last 10 years: No       Sulfa Antibiotics Nausea And Vomiting   Sulfasalazine Nausea And Vomiting   Vancomycin Hives   Social History   Socioeconomic History   Marital status: Married    Spouse name: Not on  file   Number of children: Not on file   Years of education: Not on file   Highest education level: Bachelor's degree (e.g., BA, AB, BS)  Occupational History   Not on file  Tobacco Use   Smoking status: Never   Smokeless tobacco: Never  Vaping Use   Vaping status: Never Used  Substance and Sexual Activity   Alcohol  use: Not Currently   Drug use: Never   Sexual activity: Not on file  Other Topics Concern   Not on file  Social History Narrative   Not on file   Social Drivers of Health   Financial Resource Strain: Low Risk  (02/21/2024)   Overall Financial Resource Strain (CARDIA)    Difficulty of Paying Living Expenses: Not hard at all  Food Insecurity: No Food Insecurity (02/21/2024)   Hunger Vital Sign    Worried About Running Out of Food in the Last Year: Never true    Ran Out of Food in the Last Year: Never true  Transportation Needs: No Transportation Needs (02/21/2024)   PRAPARE - Administrator, Civil Service (Medical): No    Lack of Transportation (Non-Medical): No  Physical Activity: Insufficiently Active (02/21/2024)   Exercise Vital Sign    Days of Exercise per Week: 3 days    Minutes of Exercise per Session: 30 min  Stress: No Stress Concern Present (02/21/2024)   [employer] of Occupational Health - Occupational Stress Questionnaire    Feeling of Stress : Only a little  Social Connections: Socially Integrated (02/21/2024)   Social Connection and Isolation Panel [NHANES]    Frequency of Communication with Friends and Family: More than three times a week    Frequency of Social Gatherings with Friends and Family: More than three times a week    Attends Religious Services: More than 4 times per year    Active Member of [employer] or Organizations: Yes    Attends Engineer, structural: More than 4 times per year    Marital Status: Married  Catering manager Violence: Not on file      Review of Systems  Neurological:  Positive for dizziness.  All  other systems reviewed and are negative.      Objective:   Physical Exam Vitals reviewed.  Constitutional:      General: She is not in acute distress.    Appearance: She is well-developed. She is not diaphoretic.  HENT:     Head: Normocephalic and atraumatic.     Right Ear: External ear normal.     Left Ear: External ear normal.     Nose: Nose normal.     Mouth/Throat:     Pharynx: No oropharyngeal exudate.  Eyes:     General: No scleral icterus.  Right eye: No discharge.        Left eye: No discharge.     Conjunctiva/sclera:     Left eye: Left conjunctiva is injected.     Pupils: Pupils are equal, round, and reactive to light.  Neck:     Thyroid : No thyromegaly.     Vascular: No JVD.     Trachea: No tracheal deviation.  Cardiovascular:     Rate and Rhythm: Normal rate and regular rhythm.     Heart sounds: Normal heart sounds. No murmur heard. Pulmonary:     Effort: Pulmonary effort is normal. No respiratory distress.     Breath sounds: Normal breath sounds. No stridor. No wheezing or rales.  Chest:     Chest wall: No tenderness.  Abdominal:     General: Bowel sounds are normal. There is no distension.     Palpations: Abdomen is soft. There is no mass.     Tenderness: There is no abdominal tenderness. There is no guarding or rebound.  Musculoskeletal:     Cervical back: Normal range of motion and neck supple.  Lymphadenopathy:     Cervical: No cervical adenopathy.  Skin:    General: Skin is warm.     Coloration: Skin is not pale.     Findings: No erythema or rash.  Neurological:     Mental Status: She is alert and oriented to person, place, and time.     Cranial Nerves: No cranial nerve deficit.     Motor: No abnormal muscle tone.     Coordination: Coordination normal.     Deep Tendon Reflexes: Reflexes are normal and symmetric.  Psychiatric:        Behavior: Behavior normal.        Thought Content: Thought content normal.        Judgment: Judgment  normal.           Assessment & Plan:  Muscle cramps - Plan: CBC with Differential/Platelet, COMPLETE METABOLIC PANEL WITHOUT GFR, Magnesium, CK Blood pressure today is excellent.  I will check a magnesium level, CMP to check for calcium  and potassium levels as well as CK level check for evidence myositis or muscle compound.  If labs are normal, I have encouraged the patient to drink more water .  She can also add magnesium oxide 400 mg at night or try tonic water  with quinine.  We also discussed vitamin K2 200 mcg daily for cramps prevention

## 2024-02-23 LAB — CBC WITH DIFFERENTIAL/PLATELET
Absolute Lymphocytes: 1875 {cells}/uL (ref 850–3900)
Absolute Monocytes: 410 {cells}/uL (ref 200–950)
Basophils Absolute: 51 {cells}/uL (ref 0–200)
Basophils Relative: 0.9 %
Eosinophils Absolute: 182 {cells}/uL (ref 15–500)
Eosinophils Relative: 3.2 %
HCT: 39.4 % (ref 35.0–45.0)
Hemoglobin: 13.2 g/dL (ref 11.7–15.5)
MCH: 30.4 pg (ref 27.0–33.0)
MCHC: 33.5 g/dL (ref 32.0–36.0)
MCV: 90.8 fL (ref 80.0–100.0)
MPV: 12 fL (ref 7.5–12.5)
Monocytes Relative: 7.2 %
Neutro Abs: 3181 {cells}/uL (ref 1500–7800)
Neutrophils Relative %: 55.8 %
Platelets: 243 10*3/uL (ref 140–400)
RBC: 4.34 10*6/uL (ref 3.80–5.10)
RDW: 12.6 % (ref 11.0–15.0)
Total Lymphocyte: 32.9 %
WBC: 5.7 10*3/uL (ref 3.8–10.8)

## 2024-02-23 LAB — COMPLETE METABOLIC PANEL WITHOUT GFR
AG Ratio: 1.9 (calc) (ref 1.0–2.5)
ALT: 19 U/L (ref 6–29)
AST: 17 U/L (ref 10–35)
Albumin: 4.4 g/dL (ref 3.6–5.1)
Alkaline phosphatase (APISO): 66 U/L (ref 37–153)
BUN: 14 mg/dL (ref 7–25)
CO2: 29 mmol/L (ref 20–32)
Calcium: 9.4 mg/dL (ref 8.6–10.4)
Chloride: 104 mmol/L (ref 98–110)
Creat: 0.84 mg/dL (ref 0.50–1.05)
Globulin: 2.3 g/dL (ref 1.9–3.7)
Glucose, Bld: 95 mg/dL (ref 65–99)
Potassium: 5.6 mmol/L — ABNORMAL HIGH (ref 3.5–5.3)
Sodium: 140 mmol/L (ref 135–146)
Total Bilirubin: 0.7 mg/dL (ref 0.2–1.2)
Total Protein: 6.7 g/dL (ref 6.1–8.1)

## 2024-02-23 LAB — CK: Total CK: 67 U/L (ref 20–243)

## 2024-02-23 LAB — MAGNESIUM: Magnesium: 2.1 mg/dL (ref 1.5–2.5)

## 2024-02-29 ENCOUNTER — Other Ambulatory Visit

## 2024-02-29 DIAGNOSIS — I10 Essential (primary) hypertension: Secondary | ICD-10-CM

## 2024-02-29 DIAGNOSIS — R252 Cramp and spasm: Secondary | ICD-10-CM

## 2024-02-29 LAB — BASIC METABOLIC PANEL WITH GFR
BUN: 19 mg/dL (ref 7–25)
CO2: 29 mmol/L (ref 20–32)
Calcium: 9.3 mg/dL (ref 8.6–10.4)
Chloride: 104 mmol/L (ref 98–110)
Creat: 0.82 mg/dL (ref 0.50–1.05)
Glucose, Bld: 104 mg/dL — ABNORMAL HIGH (ref 65–99)
Potassium: 4.9 mmol/L (ref 3.5–5.3)
Sodium: 140 mmol/L (ref 135–146)
eGFR: 79 mL/min/{1.73_m2} (ref >=60)

## 2024-02-29 NOTE — Progress Notes (Unsigned)
 bmp

## 2024-03-02 ENCOUNTER — Ambulatory Visit: Payer: Self-pay | Admitting: Family Medicine

## 2024-05-17 ENCOUNTER — Ambulatory Visit

## 2024-06-01 DIAGNOSIS — H4052X2 Glaucoma secondary to other eye disorders, left eye, moderate stage: Secondary | ICD-10-CM | POA: Diagnosis not present

## 2024-06-01 DIAGNOSIS — H1045 Other chronic allergic conjunctivitis: Secondary | ICD-10-CM | POA: Diagnosis not present

## 2024-06-01 DIAGNOSIS — H04123 Dry eye syndrome of bilateral lacrimal glands: Secondary | ICD-10-CM | POA: Diagnosis not present

## 2024-06-29 ENCOUNTER — Ambulatory Visit (INDEPENDENT_AMBULATORY_CARE_PROVIDER_SITE_OTHER): Admitting: *Deleted

## 2024-06-29 VITALS — Ht 67.5 in | Wt 210.0 lb

## 2024-06-29 DIAGNOSIS — Z0000 Encounter for general adult medical examination without abnormal findings: Secondary | ICD-10-CM | POA: Diagnosis not present

## 2024-06-29 NOTE — Patient Instructions (Signed)
 Nancy Steele , Thank you for taking time to come for your Medicare Wellness Visit. I appreciate your ongoing commitment to your health goals. Please review the following plan we discussed and let me know if I can assist you in the future.   Screening recommendations/referrals: Colonoscopy: up to date Mammogram: up to date Bone Density: up to date Recommended yearly ophthalmology/optometry visit for glaucoma screening and checkup Recommended yearly dental visit for hygiene and checkup  Vaccinations: Influenza vaccine: Education provided Pneumococcal vaccine: Education provided Tdap vaccine:  Shingles vaccine: Education provided     Preventive Care 65 Years and Older, Female Preventive care refers to lifestyle choices and visits with your health care provider that can promote health and wellness. What does preventive care include? A yearly physical exam. This is also called an annual well check. Dental exams once or twice a year. Routine eye exams. Ask your health care provider how often you should have your eyes checked. Personal lifestyle choices, including: Daily care of your teeth and gums. Regular physical activity. Eating a healthy diet. Avoiding tobacco and drug use. Limiting alcohol  use. Practicing safe sex. Taking low-dose aspirin  every day. Taking vitamin and mineral supplements as recommended by your health care provider. What happens during an annual well check? The services and screenings done by your health care provider during your annual well check will depend on your age, overall health, lifestyle risk factors, and family history of disease. Counseling  Your health care provider may ask you questions about your: Alcohol  use. Tobacco use. Drug use. Emotional well-being. Home and relationship well-being. Sexual activity. Eating habits. History of falls. Memory and ability to understand (cognition). Work and work Astronomer. Reproductive health. Screening   You may have the following tests or measurements: Height, weight, and BMI. Blood pressure. Lipid and cholesterol levels. These may be checked every 5 years, or more frequently if you are over 1 years old. Skin check. Lung cancer screening. You may have this screening every year starting at age 56 if you have a 30-pack-year history of smoking and currently smoke or have quit within the past 15 years. Fecal occult blood test (FOBT) of the stool. You may have this test every year starting at age 63. Flexible sigmoidoscopy or colonoscopy. You may have a sigmoidoscopy every 5 years or a colonoscopy every 10 years starting at age 37. Hepatitis C blood test. Hepatitis B blood test. Sexually transmitted disease (STD) testing. Diabetes screening. This is done by checking your blood sugar (glucose) after you have not eaten for a while (fasting). You may have this done every 1-3 years. Bone density scan. This is done to screen for osteoporosis. You may have this done starting at age 73. Mammogram. This may be done every 1-2 years. Talk to your health care provider about how often you should have regular mammograms. Talk with your health care provider about your test results, treatment options, and if necessary, the need for more tests. Vaccines  Your health care provider may recommend certain vaccines, such as: Influenza vaccine. This is recommended every year. Tetanus, diphtheria, and acellular pertussis (Tdap, Td) vaccine. You may need a Td booster every 10 years. Zoster vaccine. You may need this after age 74. Pneumococcal 13-valent conjugate (PCV13) vaccine. One dose is recommended after age 48. Pneumococcal polysaccharide (PPSV23) vaccine. One dose is recommended after age 60. Talk to your health care provider about which screenings and vaccines you need and how often you need them. This information is not intended to replace  advice given to you by your health care provider. Make sure you discuss  any questions you have with your health care provider. Document Released: 11/01/2015 Document Revised: 06/24/2016 Document Reviewed: 08/06/2015 Elsevier Interactive Patient Education  2017 [employer].  Fall Prevention in the Home Falls can cause injuries. They can happen to people of all ages. There are many things you can do to make your home safe and to help prevent falls. What can I do on the outside of my home? Regularly fix the edges of walkways and driveways and fix any cracks. Remove anything that might make you trip as you walk through a door, such as a raised step or threshold. Trim any bushes or trees on the path to your home. Use bright outdoor lighting. Clear any walking paths of anything that might make someone trip, such as rocks or tools. Regularly check to see if handrails are loose or broken. Make sure that both sides of any steps have handrails. Any raised decks and porches should have guardrails on the edges. Have any leaves, snow, or ice cleared regularly. Use sand or salt on walking paths during winter. Clean up any spills in your garage right away. This includes oil or grease spills. What can I do in the bathroom? Use night lights. Install grab bars by the toilet and in the tub and shower. Do not use towel bars as grab bars. Use non-skid mats or decals in the tub or shower. If you need to sit down in the shower, use a plastic, non-slip stool. Keep the floor dry. Clean up any water  that spills on the floor as soon as it happens. Remove soap buildup in the tub or shower regularly. Attach bath mats securely with double-sided non-slip rug tape. Do not have throw rugs and other things on the floor that can make you trip. What can I do in the bedroom? Use night lights. Make sure that you have a light by your bed that is easy to reach. Do not use any sheets or blankets that are too big for your bed. They should not hang down onto the floor. Have a firm chair that has  side arms. You can use this for support while you get dressed. Do not have throw rugs and other things on the floor that can make you trip. What can I do in the kitchen? Clean up any spills right away. Avoid walking on wet floors. Keep items that you use a lot in easy-to-reach places. If you need to reach something above you, use a strong step stool that has a grab bar. Keep electrical cords out of the way. Do not use floor polish or wax that makes floors slippery. If you must use wax, use non-skid floor wax. Do not have throw rugs and other things on the floor that can make you trip. What can I do with my stairs? Do not leave any items on the stairs. Make sure that there are handrails on both sides of the stairs and use them. Fix handrails that are broken or loose. Make sure that handrails are as long as the stairways. Check any carpeting to make sure that it is firmly attached to the stairs. Fix any carpet that is loose or worn. Avoid having throw rugs at the top or bottom of the stairs. If you do have throw rugs, attach them to the floor with carpet tape. Make sure that you have a light switch at the top of the stairs and the bottom  of the stairs. If you do not have them, ask someone to add them for you. What else can I do to help prevent falls? Wear shoes that: Do not have high heels. Have rubber bottoms. Are comfortable and fit you well. Are closed at the toe. Do not wear sandals. If you use a stepladder: Make sure that it is fully opened. Do not climb a closed stepladder. Make sure that both sides of the stepladder are locked into place. Ask someone to hold it for you, if possible. Clearly mark and make sure that you can see: Any grab bars or handrails. First and last steps. Where the edge of each step is. Use tools that help you move around (mobility aids) if they are needed. These include: Canes. Walkers. Scooters. Crutches. Turn on the lights when you go into a dark area.  Replace any light bulbs as soon as they burn out. Set up your furniture so you have a clear path. Avoid moving your furniture around. If any of your floors are uneven, fix them. If there are any pets around you, be aware of where they are. Review your medicines with your doctor. Some medicines can make you feel dizzy. This can increase your chance of falling. Ask your doctor what other things that you can do to help prevent falls. This information is not intended to replace advice given to you by your health care provider. Make sure you discuss any questions you have with your health care provider. Document Released: 08/01/2009 Document Revised: 03/12/2016 Document Reviewed: 11/09/2014 Elsevier Interactive Patient Education  2017 [employer].

## 2024-06-29 NOTE — Progress Notes (Signed)
 Subjective:   Nancy Steele is a 68 y.o. female who presents for Medicare Annual (Subsequent) preventive examination.  Visit Complete: Virtual I connected with  Nancy Steele on 06/29/24 by a audio enabled telemedicine application and verified that I am speaking with the correct person using two identifiers.  Patient Location: Home  Provider Location: Home Office  I discussed the limitations of evaluation and management by telemedicine. The patient expressed understanding and agreed to proceed.  Vital Signs: Because this visit was a virtual/telehealth visit, some criteria may be missing or patient reported. Any vitals not documented were not able to be obtained and vitals that have been documented are patient reported. .  Cardiac Risk Factors include: advanced age (>68men, >8 women);obesity (BMI >30kg/m2)     Objective:    Today's Vitals   06/29/24 1144  Weight: 210 lb (95.3 kg)  Height: 5' 7.5 (1.715 m)   Body mass index is 32.41 kg/m.     06/29/2024   11:43 AM 12/19/2018    3:10 PM 12/05/2018    5:47 AM 11/28/2018   10:02 AM 12/21/2017    1:11 PM 11/26/2017   12:58 PM 06/05/2016    3:19 PM  Advanced Directives  Does Patient Have a Medical Advance Directive? Yes No  No  No  No  No  No   Type of Advance Directive Healthcare Power of Attorney        Would patient like information on creating a medical advance directive?  No - Patient declined  No - Patient declined   No - Patient declined  No - Patient declined       Data saved with a previous flowsheet row definition    Current Medications (verified) Outpatient Encounter Medications as of 06/29/2024  Medication Sig   COMBIGAN  0.2-0.5 % ophthalmic solution Place 1 drop into the left eye 2 (two) times daily.    Cyanocobalamin (VITAMIN B 12 PO) Take by mouth.   fexofenadine (ALLEGRA ODT) 30 MG disintegrating tablet Take 30 mg by mouth daily.   fluticasone (FLONASE) 50 MCG/ACT nasal spray Place into both nostrils as  needed for allergies or rhinitis.   ketoconazole (NIZORAL) 2 % cream Apply topically.   latanoprost (XALATAN) 0.005 % ophthalmic solution Place 1 drop into the left eye at bedtime.   Multiple Vitamin (MULTIVITAMIN) tablet Take 1 tablet by mouth daily.   lisinopril  (ZESTRIL ) 10 MG tablet Take 1 tablet (10 mg total) by mouth daily. (Patient not taking: Reported on 06/29/2024)   No facility-administered encounter medications on file as of 06/29/2024.    Allergies (verified) Penicillins, Sulfa antibiotics, Sulfasalazine, and Vancomycin   History: Past Medical History:  Diagnosis Date   Allergy    seasonal   Arthritis    Breast cyst 03/22/2012   right breast benign   Breast cyst 2006   left breast benign   Cataract    removed left eye, very small right   DDD (degenerative disc disease), cervical    Family history of adverse reaction to anesthesia    brother slow to wake up   Glaucoma, left eye    Hyperlipidemia    past hx- no meds   Hypertension    OA (osteoarthritis)    knees   Seasonal allergies    Wears glasses    Past Surgical History:  Procedure Laterality Date   CARPAL TUNNEL RELEASE Bilateral 2014   CATARACT EXTRACTION W/ INTRAOCULAR LENS IMPLANT Left 2014   CERVICAL FUSION  2000  C5 -- C6   CHONDROPLASTY Left 10/17/2014   Procedure: CHONDROPLASTY;  Surgeon: Lamar DELENA Millman, MD;  Location: [facility];  Service: Orthopedics;  Laterality: Left;   COLONOSCOPY     KNEE ARTHROSCOPY Bilateral left , prior to 2015;  right 2009 approx.   KNEE ARTHROSCOPY WITH LATERAL MENISECTOMY Left 10/17/2014   Procedure: KNEE ARTHROSCOPY WITH LATERAL MENISECTOMY;  Surgeon: Lamar DELENA Millman, MD;  Location: [facility];  Service: Orthopedics;  Laterality: Left;   KNEE ARTHROSCOPY WITH MEDIAL MENISECTOMY Left 10/17/2014   Procedure: LEFT KNEE ARTHROSCOPY WITH MEDIAL MENISCECTOMY;  Surgeon: Lamar DELENA Millman, MD;  Location:  SURGERY CENTER;  Service:  Orthopedics;  Laterality: Left;   POLYPECTOMY     TOTAL KNEE ARTHROPLASTY Left 12/06/2017   Procedure: LEFT TOTAL KNEE ARTHROPLASTY;  Surgeon: Millman Lamar, MD;  Location: [facility];  Service: Orthopedics;  Laterality: Left;   TOTAL KNEE ARTHROPLASTY Right 12/05/2018   Procedure: RIGHT TOTAL KNEE ARTHROPLASTY;  Surgeon: Millman Lamar, MD;  Location: [facility];  Service: Orthopedics;  Laterality: Right;   Family History  Problem Relation Age of Onset   Diabetes Other    Hypertension Other    Arthritis Other    Kidney cancer Father    Breast cancer Maternal Aunt    Colon cancer Neg Hx    Colon polyps Neg Hx    Esophageal cancer Neg Hx    Rectal cancer Neg Hx    Stomach cancer Neg Hx    Social History   Socioeconomic History   Marital status: Married    Spouse name: Not on file   Number of children: Not on file   Years of education: Not on file   Highest education level: Bachelor's degree (e.g., BA, AB, BS)  Occupational History   Not on file  Tobacco Use   Smoking status: Never   Smokeless tobacco: Never  Vaping Use   Vaping status: Never Used  Substance and Sexual Activity   Alcohol  use: Not Currently   Drug use: Never   Sexual activity: Not on file  Other Topics Concern   Not on file  Social History Narrative   Not on file   Social Drivers of Health   Financial Resource Strain: Low Risk  (06/29/2024)   Overall Financial Resource Strain (CARDIA)    Difficulty of Paying Living Expenses: Not hard at all  Food Insecurity: No Food Insecurity (06/29/2024)   Hunger Vital Sign    Worried About Running Out of Food in the Last Year: Never true    Ran Out of Food in the Last Year: Never true  Transportation Needs: No Transportation Needs (02/21/2024)   PRAPARE - Administrator, Civil Service (Medical): No    Lack of Transportation (Non-Medical): No  Physical Activity: Insufficiently Active (06/29/2024)   Exercise Vital Sign    Days of Exercise per Week: 3 days     Minutes of Exercise per Session: 30 min  Stress: No Stress Concern Present (02/21/2024)   [employer] of Occupational Health - Occupational Stress Questionnaire    Feeling of Stress : Only a little  Social Connections: Moderately Integrated (06/29/2024)   Social Connection and Isolation Panel    Frequency of Communication with Friends and Family: More than three times a week    Frequency of Social Gatherings with Friends and Family: More than three times a week    Attends Religious Services: More than 4 times per year    Active  Member of Clubs or Organizations: No    Attends Engineer, structural: Never    Marital Status: Married    Tobacco Counseling Counseling given: Not Answered   Clinical Intake:  Pre-visit preparation completed: Yes  Pain : No/denies pain     Diabetes: No  How often do you need to have someone help you when you read instructions, pamphlets, or other written materials from your doctor or pharmacy?: 1 - Never  Interpreter Needed?: No  Information entered by :: Mliss Graff LPN   Activities of Daily Living    06/29/2024   11:45 AM  In your present state of health, do you have any difficulty performing the following activities:  Hearing? 0  Vision? 0  Difficulty concentrating or making decisions? 0  Walking or climbing stairs? 0  Dressing or bathing? 0  Doing errands, shopping? 0  Preparing Food and eating ? N  Using the Toilet? N  Do you have problems with loss of bowel control? N  Managing your Medications? N  Managing your Finances? N  Housekeeping or managing your Housekeeping? N    Patient Care Team: Duanne Butler DASEN, MD as PCP - General (Family Medicine)  Indicate any recent Medical Services you may have received from other than [facility] providers in the past year (date may be approximate).     Assessment:   This is a routine wellness examination for Thereasa.  Hearing/Vision screen Hearing Screening - Comments:: No trouble  hearing Vision Screening - Comments:: Up to date Camillo Aran   Goals Addressed             This Visit's Progress    Weight (lb) < 200 lb (90.7 kg)   210 lb (95.3 kg)      Depression Screen    06/29/2024   11:48 AM 09/07/2023   10:20 AM 01/29/2023    2:54 PM 01/01/2022    3:00 PM 09/08/2017    8:32 AM  PHQ 2/9 Scores  PHQ - 2 Score 0 0 0 0 0  PHQ- 9 Score 2   0     Fall Risk    09/07/2023   10:20 AM 01/29/2023    2:54 PM 01/01/2022    3:00 PM  Fall Risk   Falls in the past year? 0 0 0  Number falls in past yr: 0 0 0  Injury with Fall? 0 0 0  Risk for fall due to :  No Fall Risks   Follow up  Falls prevention discussed     MEDICARE RISK AT HOME: Medicare Risk at Home Any stairs in or around the home?: Yes If so, are there any without handrails?: No Home free of loose throw rugs in walkways, pet beds, electrical cords, etc?: Yes Adequate lighting in your home to reduce risk of falls?: Yes Life alert?: No Use of a cane, walker or w/c?: No Grab bars in the bathroom?: No Shower chair or bench in shower?: No Elevated toilet seat or a handicapped toilet?: Yes  TIMED UP AND GO:  Was the test performed?  No    Cognitive Function:        06/29/2024   11:45 AM  6CIT Screen  What Year? 0 points  What month? 0 points  What time? 0 points  Count back from 20 0 points  Months in reverse 0 points  Repeat phrase 2 points  Total Score 2 points    Immunizations Immunization History  Administered Date(s) Administered   Influenza,inj,Quad  PF,6+ Mos 09/28/2018, 07/06/2019   Influenza-Unspecified 07/28/2016, 08/11/2017, 10/20/2017, 09/16/2018, 10/31/2020   Tdap 09/08/2017    TDAP status: Up to date  Flu Vaccine status: Due, Education has been provided regarding the importance of this vaccine. Advised may receive this vaccine at local pharmacy or Health Dept. Aware to provide a copy of the vaccination record if obtained from local pharmacy or Health Dept.  Verbalized acceptance and understanding.  Pneumococcal vaccine status: Due, Education has been provided regarding the importance of this vaccine. Advised may receive this vaccine at local pharmacy or Health Dept. Aware to provide a copy of the vaccination record if obtained from local pharmacy or Health Dept. Verbalized acceptance and understanding.  Covid-19 vaccine status: Information provided on how to obtain vaccines.   Qualifies for Shingles Vaccine? Yes   Zostavax completed No   Shingrix Completed?: No.    Education has been provided regarding the importance of this vaccine. Patient has been advised to call insurance company to determine out of pocket expense if they have not yet received this vaccine. Advised may also receive vaccine at local pharmacy or Health Dept. Verbalized acceptance and understanding.  Screening Tests Health Maintenance  Topic Date Due   Hepatitis C Screening  Never done   Pneumococcal Vaccine: 50+ Years (1 of 1 - PCV) Never done   Zoster Vaccines- Shingrix (1 of 2) Never done   Influenza Vaccine  05/19/2024   COVID-19 Vaccine (1 - 2024-25 season) Never done   Mammogram  12/20/2024   Medicare Annual Wellness (AWV)  06/29/2025   Colonoscopy  12/04/2026   DTaP/Tdap/Td (2 - Td or Tdap) 09/09/2027   DEXA SCAN  Completed   HPV VACCINES  Aged Out   Meningococcal B Vaccine  Aged Out    Health Maintenance  Health Maintenance Due  Topic Date Due   Hepatitis C Screening  Never done   Pneumococcal Vaccine: 50+ Years (1 of 1 - PCV) Never done   Zoster Vaccines- Shingrix (1 of 2) Never done   Influenza Vaccine  05/19/2024   COVID-19 Vaccine (1 - 2024-25 season) Never done    Colorectal cancer screening: Type of screening: Colonoscopy. Completed 2025. Repeat every 5 years  Mammogram status: Completed  . Repeat every year  Bone Density status: Completed 2023. Results reflect: Bone density results: NORMAL. Repeat every 5 years.  Lung Cancer Screening: (Low  Dose CT Chest recommended if Age 41-80 years, 20 pack-year currently smoking OR have quit w/in 15years.) does not qualify.   Lung Cancer Screening Referral:   Additional Screening:  Hepatitis C Screening  never done  Vision Screening: Recommended annual ophthalmology exams for early detection of glaucoma and other disorders of the eye. Is the patient up to date with their annual eye exam?  Yes  Who is the provider or what is the name of the office in which the patient attends annual eye exams? [facility] If pt is not established with a provider, would they like to be referred to a provider to establish care? No .   Dental Screening: Recommended annual dental exams for proper oral hygiene    Community Resource Referral / Chronic Care Management: CRR required this visit?  No   CCM required this visit?  No     Plan:     I have personally reviewed and noted the following in the patient's chart:   Medical and social history Use of alcohol , tobacco or illicit drugs  Current medications and supplements including opioid prescriptions. Patient is  currently taking opioid prescriptions. Information provided to patient regarding non-opioid alternatives. Patient advised to discuss non-opioid treatment plan with their provider. Functional ability and status Nutritional status Physical activity Advanced directives List of other physicians Hospitalizations, surgeries, and ER visits in previous 12 months Vitals Screenings to include cognitive, depression, and falls Referrals and appointments  In addition, I have reviewed and discussed with patient certain preventive protocols, quality metrics, and best practice recommendations. A written personalized care plan for preventive services as well as general preventive health recommendations were provided to patient.     Mliss Graff, LPN   [id number]   After Visit Summary: (MyChart) Due to this being a telephonic visit, the after visit summary  with patients personalized plan was offered to patient via MyChart   Nurse Notes:

## 2024-07-25 ENCOUNTER — Other Ambulatory Visit: Payer: Self-pay | Admitting: Family Medicine

## 2024-07-25 DIAGNOSIS — Z1231 Encounter for screening mammogram for malignant neoplasm of breast: Secondary | ICD-10-CM

## 2024-07-31 NOTE — Progress Notes (Unsigned)
   07/31/2024  Patient ID: Nancy Steele, female   DOB: [birth year], 68 y.o.   MRN: [id number]  Pharmacy Quality Measure Review  This patient is appearing on a report for being at risk of failing the adherence measure for hypertension (ACEi/ARB) medications this calendar year.   Medication: lisinopril  10mg  daily - last sold 03/17/2024 90 DS   Will message PCP regarding potentially untreated BP, was supposed to be seen in 04/2024 but cancelled through mychart.   Recommend change to amlodipine 2.5mg , no previous trial noted.    Future Appointments  Date Time Provider Department Center  08/01/2024  3:30 PM [facility] 3 [facility]  07/05/2025 12:20 PM [facility]-ANNUAL WELLNESS VISIT [facility]      06/29/2024   11:44 AM 02/22/2024   10:49 AM 09/07/2023   10:17 AM  Vitals with BMI  Height 5' 7.5 5' 7.5 5' 7.5  Weight 210 lbs 224 lbs 3 oz 222 lbs 2 oz  BMI 32.39 34.58 34.26  Systolic  132 142  Diastolic  78 88  Pulse  57 72     Lang Sieve, PharmD, BCGP Clinical Pharmacist  [phone]

## 2024-08-01 ENCOUNTER — Ambulatory Visit: Admission: RE | Admit: 2024-08-01 | Discharge: 2024-08-01 | Disposition: A | Source: Ambulatory Visit

## 2024-08-01 DIAGNOSIS — Z1231 Encounter for screening mammogram for malignant neoplasm of breast: Secondary | ICD-10-CM

## 2024-08-03 ENCOUNTER — Other Ambulatory Visit

## 2024-08-03 DIAGNOSIS — R252 Cramp and spasm: Secondary | ICD-10-CM

## 2024-08-03 DIAGNOSIS — Z780 Asymptomatic menopausal state: Secondary | ICD-10-CM

## 2024-08-03 DIAGNOSIS — E66811 Obesity, class 1: Secondary | ICD-10-CM | POA: Diagnosis not present

## 2024-08-03 DIAGNOSIS — Z0000 Encounter for general adult medical examination without abnormal findings: Secondary | ICD-10-CM | POA: Diagnosis not present

## 2024-08-03 DIAGNOSIS — Z13220 Encounter for screening for lipoid disorders: Secondary | ICD-10-CM

## 2024-08-03 DIAGNOSIS — E6609 Other obesity due to excess calories: Secondary | ICD-10-CM | POA: Diagnosis not present

## 2024-08-03 DIAGNOSIS — H4010X0 Unspecified open-angle glaucoma, stage unspecified: Secondary | ICD-10-CM

## 2024-08-03 DIAGNOSIS — G4733 Obstructive sleep apnea (adult) (pediatric): Secondary | ICD-10-CM | POA: Diagnosis not present

## 2024-08-03 DIAGNOSIS — I10 Essential (primary) hypertension: Secondary | ICD-10-CM

## 2024-08-03 DIAGNOSIS — Z6833 Body mass index (BMI) 33.0-33.9, adult: Secondary | ICD-10-CM | POA: Diagnosis not present

## 2024-08-04 ENCOUNTER — Ambulatory Visit: Payer: Self-pay | Admitting: Family Medicine

## 2024-08-04 LAB — COMPLETE METABOLIC PANEL WITHOUT GFR
AG Ratio: 2.5 (calc) (ref 1.0–2.5)
ALT: 17 U/L (ref 6–29)
AST: 16 U/L (ref 10–35)
Albumin: 4.7 g/dL (ref 3.6–5.1)
Alkaline phosphatase (APISO): 67 U/L (ref 37–153)
BUN: 15 mg/dL (ref 7–25)
CO2: 28 mmol/L (ref 20–32)
Calcium: 9.4 mg/dL (ref 8.6–10.4)
Chloride: 103 mmol/L (ref 98–110)
Creat: 0.84 mg/dL (ref 0.50–1.05)
Globulin: 1.9 g/dL (ref 1.9–3.7)
Glucose, Bld: 101 mg/dL — ABNORMAL HIGH (ref 65–99)
Potassium: 5.4 mmol/L — ABNORMAL HIGH (ref 3.5–5.3)
Sodium: 138 mmol/L (ref 135–146)
Total Bilirubin: 0.6 mg/dL (ref 0.2–1.2)
Total Protein: 6.6 g/dL (ref 6.1–8.1)

## 2024-08-04 LAB — CBC WITH DIFFERENTIAL/PLATELET
Absolute Lymphocytes: 1709 {cells}/uL (ref 850–3900)
Absolute Monocytes: 352 {cells}/uL (ref 200–950)
Basophils Absolute: 61 {cells}/uL (ref 0–200)
Basophils Relative: 1.2 %
Eosinophils Absolute: 184 {cells}/uL (ref 15–500)
Eosinophils Relative: 3.6 %
HCT: 40.2 % (ref 35.0–45.0)
Hemoglobin: 13.5 g/dL (ref 11.7–15.5)
MCH: 31 pg (ref 27.0–33.0)
MCHC: 33.6 g/dL (ref 32.0–36.0)
MCV: 92.4 fL (ref 80.0–100.0)
MPV: 11.6 fL (ref 7.5–12.5)
Monocytes Relative: 6.9 %
Neutro Abs: 2795 {cells}/uL (ref 1500–7800)
Neutrophils Relative %: 54.8 %
Platelets: 245 Thousand/uL (ref 140–400)
RBC: 4.35 Million/uL (ref 3.80–5.10)
RDW: 12.2 % (ref 11.0–15.0)
Total Lymphocyte: 33.5 %
WBC: 5.1 Thousand/uL (ref 3.8–10.8)

## 2024-08-04 LAB — LIPID PANEL
Cholesterol: 223 mg/dL — ABNORMAL HIGH (ref <200)
HDL: 67 mg/dL (ref >=50)
LDL Cholesterol (Calc): 134 mg/dL — ABNORMAL HIGH
Non-HDL Cholesterol (Calc): 156 mg/dL — ABNORMAL HIGH (ref <130)
Total CHOL/HDL Ratio: 3.3 (calc) (ref <5.0)
Triglycerides: 109 mg/dL (ref <150)

## 2024-08-07 ENCOUNTER — Encounter: Payer: Self-pay | Admitting: Family Medicine

## 2024-08-07 ENCOUNTER — Ambulatory Visit: Admitting: Family Medicine

## 2024-08-07 VITALS — BP 128/72 | HR 94 | Temp 98.3°F | Ht 67.5 in | Wt 209.0 lb

## 2024-08-07 DIAGNOSIS — I10 Essential (primary) hypertension: Secondary | ICD-10-CM | POA: Diagnosis not present

## 2024-08-07 MED ORDER — HYDROCHLOROTHIAZIDE 12.5 MG PO TABS: 12.5000 mg | ORAL_TABLET | Freq: Every day | ORAL | 3 refills | Status: AC

## 2024-08-07 NOTE — Progress Notes (Signed)
 Subjective:    Patient ID: Nancy Steele, female    DOB: 20-Sep-1956, 68 y.o.   MRN: [id number]   Patient is a very sweet 68 year old Caucasian female who has a history of hypertension.  Discontinued lisinopril  this summer due to hyperkalemia.  Here to recheck potassium and bp.   The 10-year ASCVD risk score (Arnett DK, et al., 2019) is: 8.1%   Values used to calculate the score:     Age: 68 years     Clincally relevant sex: Female     Is Non-Hispanic African American: No     Diabetic: No     Tobacco smoker: No     Systolic Blood Pressure: 128 mmHg     Is BP treated: Yes     HDL Cholesterol: 67 mg/dL     Total Cholesterol: 223 mg/dL Patient states her blood pressure can be as high as 140/100.  This happens 2 to 3 days a week.  The other 3 or 4 days her blood pressure is well-controlled.  However her most recent potassium is elevated despite not taking any lisinopril .  Her cholesterol is also elevated.  Her 10-year cardiovascular risk is roughly 8% Past Medical History:  Diagnosis Date   Allergy    seasonal   Arthritis    Breast cyst 03/22/2012   right breast benign   Breast cyst 2006   left breast benign   Cataract    removed left eye, very small right   DDD (degenerative disc disease), cervical    Family history of adverse reaction to anesthesia    brother slow to wake up   Glaucoma, left eye    Hyperlipidemia    past hx- no meds   Hypertension    OA (osteoarthritis)    knees   Seasonal allergies    Wears glasses    Past Surgical History:  Procedure Laterality Date   CARPAL TUNNEL RELEASE Bilateral 2014   CATARACT EXTRACTION W/ INTRAOCULAR LENS IMPLANT Left 2014   CERVICAL FUSION  2000   C5 -- C6   CHONDROPLASTY Left 10/17/2014   Procedure: CHONDROPLASTY;  Surgeon: Lamar DELENA Millman, MD;  Location: [facility];  Service: Orthopedics;  Laterality: Left;   COLONOSCOPY     KNEE ARTHROSCOPY Bilateral left , prior to 2015;  right 2009 approx.   KNEE  ARTHROSCOPY WITH LATERAL MENISECTOMY Left 10/17/2014   Procedure: KNEE ARTHROSCOPY WITH LATERAL MENISECTOMY;  Surgeon: Lamar DELENA Millman, MD;  Location: [facility];  Service: Orthopedics;  Laterality: Left;   KNEE ARTHROSCOPY WITH MEDIAL MENISECTOMY Left 10/17/2014   Procedure: LEFT KNEE ARTHROSCOPY WITH MEDIAL MENISCECTOMY;  Surgeon: Lamar DELENA Millman, MD;  Location: [facility];  Service: Orthopedics;  Laterality: Left;   POLYPECTOMY     TOTAL KNEE ARTHROPLASTY Left 12/06/2017   Procedure: LEFT TOTAL KNEE ARTHROPLASTY;  Surgeon: Millman Lamar, MD;  [facility];  Service: Orthopedics;  Laterality: Left;   TOTAL KNEE ARTHROPLASTY Right 12/05/2018   Procedure: RIGHT TOTAL KNEE ARTHROPLASTY;  Surgeon: Millman Lamar, MD;  Location: [facility];  Service: Orthopedics;  Laterality: Right;   Current Outpatient Medications on File Prior to Visit  Medication Sig Dispense Refill   COMBIGAN  0.2-0.5 % ophthalmic solution Place 1 drop into the left eye 2 (two) times daily.   3   Cyanocobalamin (VITAMIN B 12 PO) Take by mouth.     fexofenadine (ALLEGRA ODT) 30 MG disintegrating tablet Take 30 mg by mouth daily.  fluticasone (FLONASE) 50 MCG/ACT nasal spray Place into both nostrils as needed for allergies or rhinitis.     ketoconazole (NIZORAL) 2 % cream Apply topically.     latanoprost (XALATAN) 0.005 % ophthalmic solution Place 1 drop into the left eye at bedtime.     lisinopril  (ZESTRIL ) 10 MG tablet Take 1 tablet (10 mg total) by mouth daily. (Patient not taking: Reported on 06/29/2024) 90 tablet 3   Multiple Vitamin (MULTIVITAMIN) tablet Take 1 tablet by mouth daily.     No current facility-administered medications on file prior to visit.   Allergies  Allergen Reactions   Penicillins Swelling, Rash and Other (See Comments)    #####Patient has had Ancef  for multiple surgeries without a reaction######   SWELLING OF THE THROAT, OTHER CHILDHOOD REACTION PCN REACTION WITH  IMMEDIATE RASH, FACIAL/TONGUE/THROAT SWELLING, SOB, OR LIGHTHEADEDNESS WITH HYPOTENSION:  #  #  #  YES  #  #  #  Has patient had a PCN reaction causing severe rash involving mucus membranes or skin necrosis: Unknown Has patient had a PCN reaction that required hospitalization: No Has patient had a PCN reaction occurring within the last 10 years: No       Sulfa Antibiotics Nausea And Vomiting   Sulfasalazine Nausea And Vomiting   Vancomycin Hives   Social History   Socioeconomic History   Marital status: Married    Spouse name: Not on file   Number of children: Not on file   Years of education: Not on file   Highest education level: Bachelor's degree (e.g., BA, AB, BS)  Occupational History   Not on file  Tobacco Use   Smoking status: Never   Smokeless tobacco: Never  Vaping Use   Vaping status: Never Used  Substance and Sexual Activity   Alcohol  use: Not Currently   Drug use: Never   Sexual activity: Not on file  Other Topics Concern   Not on file  Social History Narrative   Not on file   Social Drivers of Health   Financial Resource Strain: Low Risk  (08/02/2024)   Overall Financial Resource Strain (CARDIA)    Difficulty of Paying Living Expenses: Not hard at all  Food Insecurity: No Food Insecurity (08/02/2024)   Hunger Vital Sign    Worried About Running Out of Food in the Last Year: Never true    Ran Out of Food in the Last Year: Never true  Transportation Needs: No Transportation Needs (08/02/2024)   PRAPARE - Administrator, Civil Service (Medical): No    Lack of Transportation (Non-Medical): No  Physical Activity: Insufficiently Active (08/02/2024)   Exercise Vital Sign    Days of Exercise per Week: 2 days    Minutes of Exercise per Session: 30 min  Stress: No Stress Concern Present (08/02/2024)   [employer] of Occupational Health - Occupational Stress Questionnaire    Feeling of Stress: Only a little  Social Connections: Socially  Integrated (08/02/2024)   Social Connection and Isolation Panel    Frequency of Communication with Friends and Family: More than three times a week    Frequency of Social Gatherings with Friends and Family: More than three times a week    Attends Religious Services: More than 4 times per year    Active Member of [employer] or Organizations: Yes    Attends Banker Meetings: More than 4 times per year    Marital Status: Married  Catering manager Violence: Not At Risk (06/29/2024)  Humiliation, Afraid, Rape, and Kick questionnaire    Fear of Current or Ex-Partner: No    Emotionally Abused: No    Physically Abused: No    Sexually Abused: No      Review of Systems  Neurological:  Positive for dizziness.  All other systems reviewed and are negative.      Objective:   Physical Exam Vitals reviewed.  Constitutional:      General: She is not in acute distress.    Appearance: She is well-developed. She is not diaphoretic.  HENT:     Head: Normocephalic and atraumatic.     Right Ear: External ear normal.     Left Ear: External ear normal.     Nose: Nose normal.     Mouth/Throat:     Pharynx: No oropharyngeal exudate.  Eyes:     General: No scleral icterus.       Right eye: No discharge.        Left eye: No discharge.     Conjunctiva/sclera:     Left eye: Left conjunctiva is injected.     Pupils: Pupils are equal, round, and reactive to light.  Neck:     Thyroid : No thyromegaly.     Vascular: No JVD.     Trachea: No tracheal deviation.  Cardiovascular:     Rate and Rhythm: Normal rate and regular rhythm.     Heart sounds: Normal heart sounds. No murmur heard. Pulmonary:     Effort: Pulmonary effort is normal. No respiratory distress.     Breath sounds: Normal breath sounds. No stridor. No wheezing or rales.  Chest:     Chest wall: No tenderness.  Abdominal:     General: Bowel sounds are normal. There is no distension.     Palpations: Abdomen is soft. There is no  mass.     Tenderness: There is no abdominal tenderness. There is no guarding or rebound.  Musculoskeletal:     Cervical back: Normal range of motion and neck supple.  Lymphadenopathy:     Cervical: No cervical adenopathy.  Skin:    General: Skin is warm.     Coloration: Skin is not pale.     Findings: No erythema or rash.  Neurological:     Mental Status: She is alert and oriented to person, place, and time.     Cranial Nerves: No cranial nerve deficit.     Motor: No abnormal muscle tone.     Coordination: Coordination normal.     Deep Tendon Reflexes: Reflexes are normal and symmetric.  Psychiatric:        Behavior: Behavior normal.        Thought Content: Thought content normal.        Judgment: Judgment normal.           Assessment & Plan:  Benign essential HTN - Plan: CT CARDIAC SCORING (SELF PAY ONLY) In an effort to treat her blood pressure and lower her potassium I started the patient on hydrochlorothiazide 12.5 mg daily.  Discontinue lisinopril  altogether.  Patient is hesitant to take a statin due to her history of statin induced myopathy.  She would like to get a coronary artery calcium  score to risk stratify her cardiovascular risk and then if elevated, she plans to try a statin again.

## 2024-08-24 ENCOUNTER — Ambulatory Visit (HOSPITAL_BASED_OUTPATIENT_CLINIC_OR_DEPARTMENT_OTHER)
Admission: RE | Admit: 2024-08-24 | Discharge: 2024-08-24 | Disposition: A | Discharge status: Home / Self Care | Payer: Self-pay | Source: Ambulatory Visit | Attending: Family Medicine | Admitting: Family Medicine

## 2024-08-24 DIAGNOSIS — I10 Essential (primary) hypertension: Secondary | ICD-10-CM

## 2024-08-25 ENCOUNTER — Ambulatory Visit: Payer: Self-pay | Admitting: Family Medicine

## 2025-07-05 ENCOUNTER — Encounter
